# Patient Record
Sex: Male | Born: 1965 | Race: Black or African American | Hispanic: No | Marital: Single | Smoking: Never smoker
Health system: Southern US, Community
[De-identification: ages and names within clinical notes are randomized; demographics above are authoritative.]

## PROBLEM LIST (undated history)

## (undated) DIAGNOSIS — L309 Dermatitis, unspecified: Secondary | ICD-10-CM

## (undated) DIAGNOSIS — F840 Autistic disorder: Secondary | ICD-10-CM

## (undated) DIAGNOSIS — L509 Urticaria, unspecified: Secondary | ICD-10-CM

## (undated) DIAGNOSIS — E119 Type 2 diabetes mellitus without complications: Secondary | ICD-10-CM

## (undated) DIAGNOSIS — F8189 Other developmental disorders of scholastic skills: Secondary | ICD-10-CM

## (undated) HISTORY — DX: Dermatitis, unspecified: L30.9

## (undated) HISTORY — DX: Urticaria, unspecified: L50.9

## (undated) HISTORY — DX: Type 2 diabetes mellitus without complications: E11.9

---

## 2009-05-28 DIAGNOSIS — E669 Obesity, unspecified: Secondary | ICD-10-CM | POA: Insufficient documentation

## 2009-05-28 DIAGNOSIS — I10 Essential (primary) hypertension: Secondary | ICD-10-CM | POA: Insufficient documentation

## 2009-05-28 DIAGNOSIS — E785 Hyperlipidemia, unspecified: Secondary | ICD-10-CM | POA: Insufficient documentation

## 2009-05-28 DIAGNOSIS — F73 Profound intellectual disabilities: Secondary | ICD-10-CM | POA: Insufficient documentation

## 2009-05-28 DIAGNOSIS — F840 Autistic disorder: Secondary | ICD-10-CM | POA: Insufficient documentation

## 2009-05-28 DIAGNOSIS — L309 Dermatitis, unspecified: Secondary | ICD-10-CM | POA: Insufficient documentation

## 2009-06-07 DIAGNOSIS — E1165 Type 2 diabetes mellitus with hyperglycemia: Secondary | ICD-10-CM | POA: Insufficient documentation

## 2009-06-07 DIAGNOSIS — K029 Dental caries, unspecified: Secondary | ICD-10-CM | POA: Insufficient documentation

## 2009-06-18 ENCOUNTER — Ambulatory Visit (HOSPITAL_COMMUNITY): Admission: RE | Admit: 2009-06-18 | Discharge: 2009-06-18 | Payer: Self-pay | Admitting: Dentistry

## 2010-02-25 IMAGING — CR DG CHEST 2V
2 series · 2 of 2 positions shown · non-contrast
Comparison: None

CLINICAL DATA: Preadmission for dental surgery

CHEST - 2 VIEW

[view not recorded (1 of 2)]
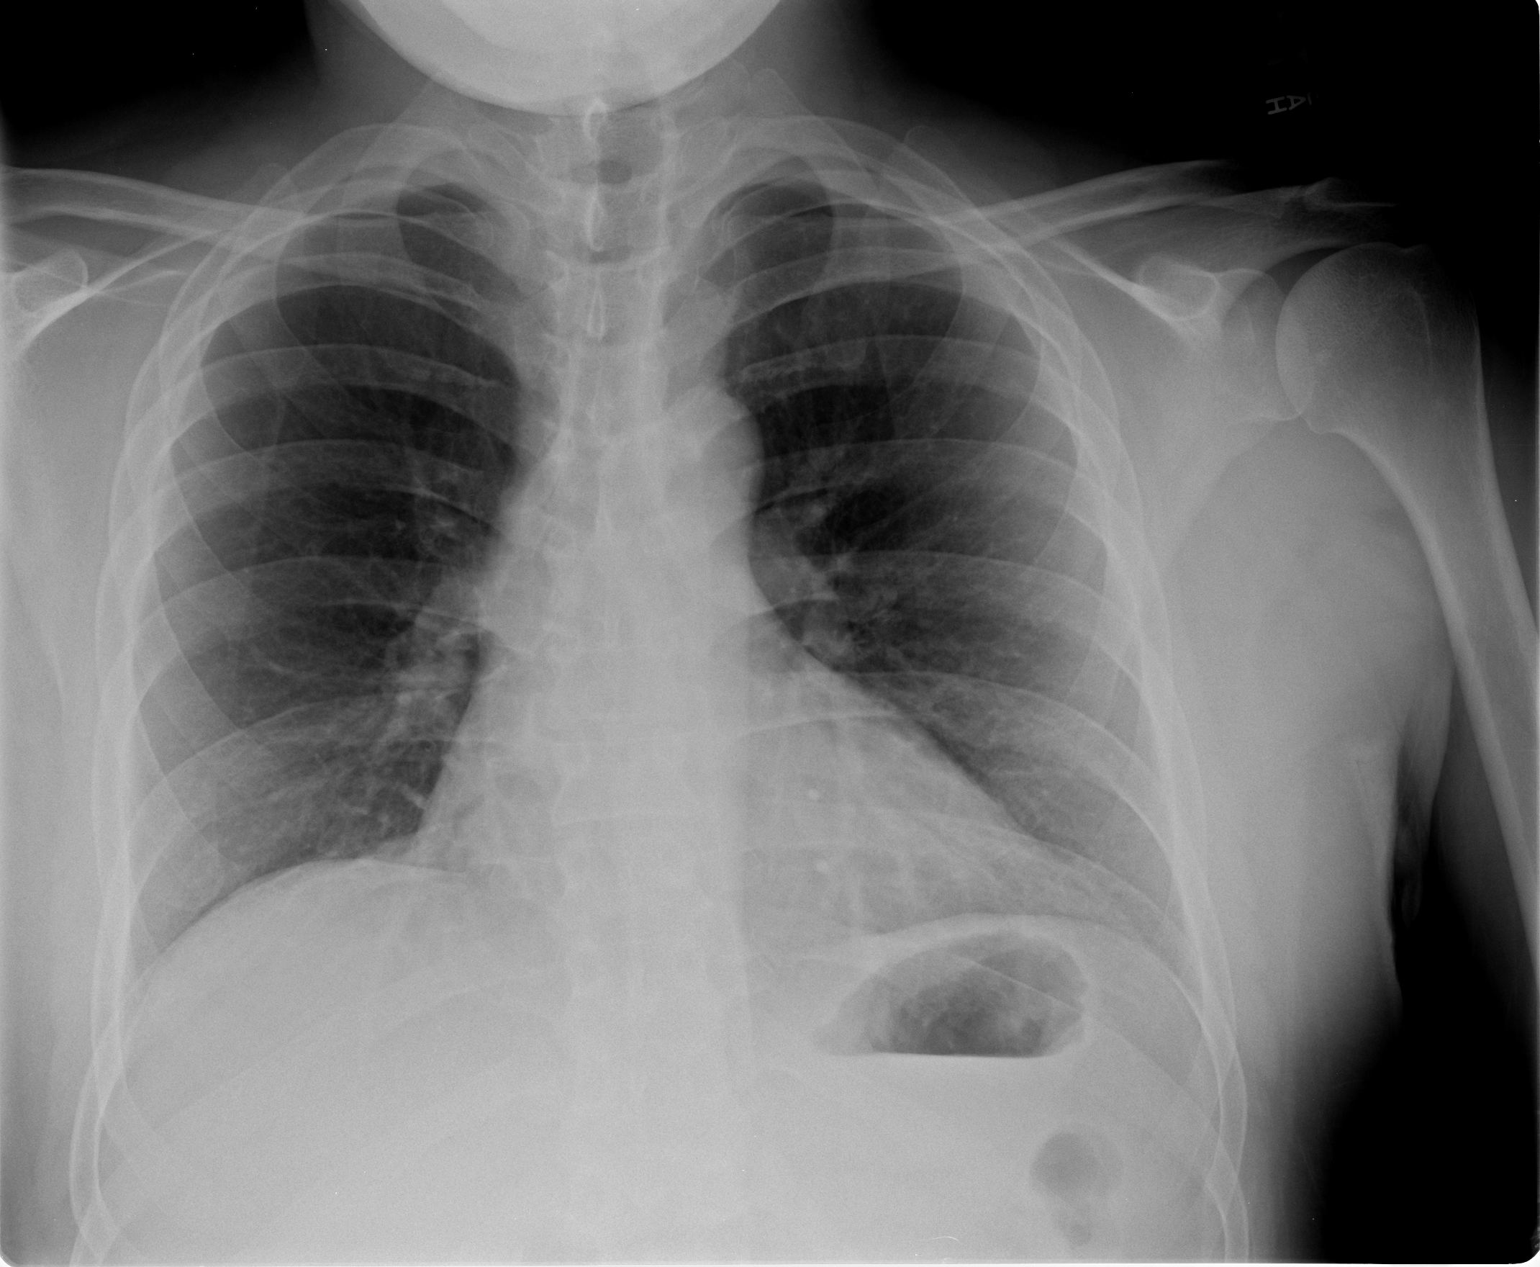

[view not recorded (2 of 2)]
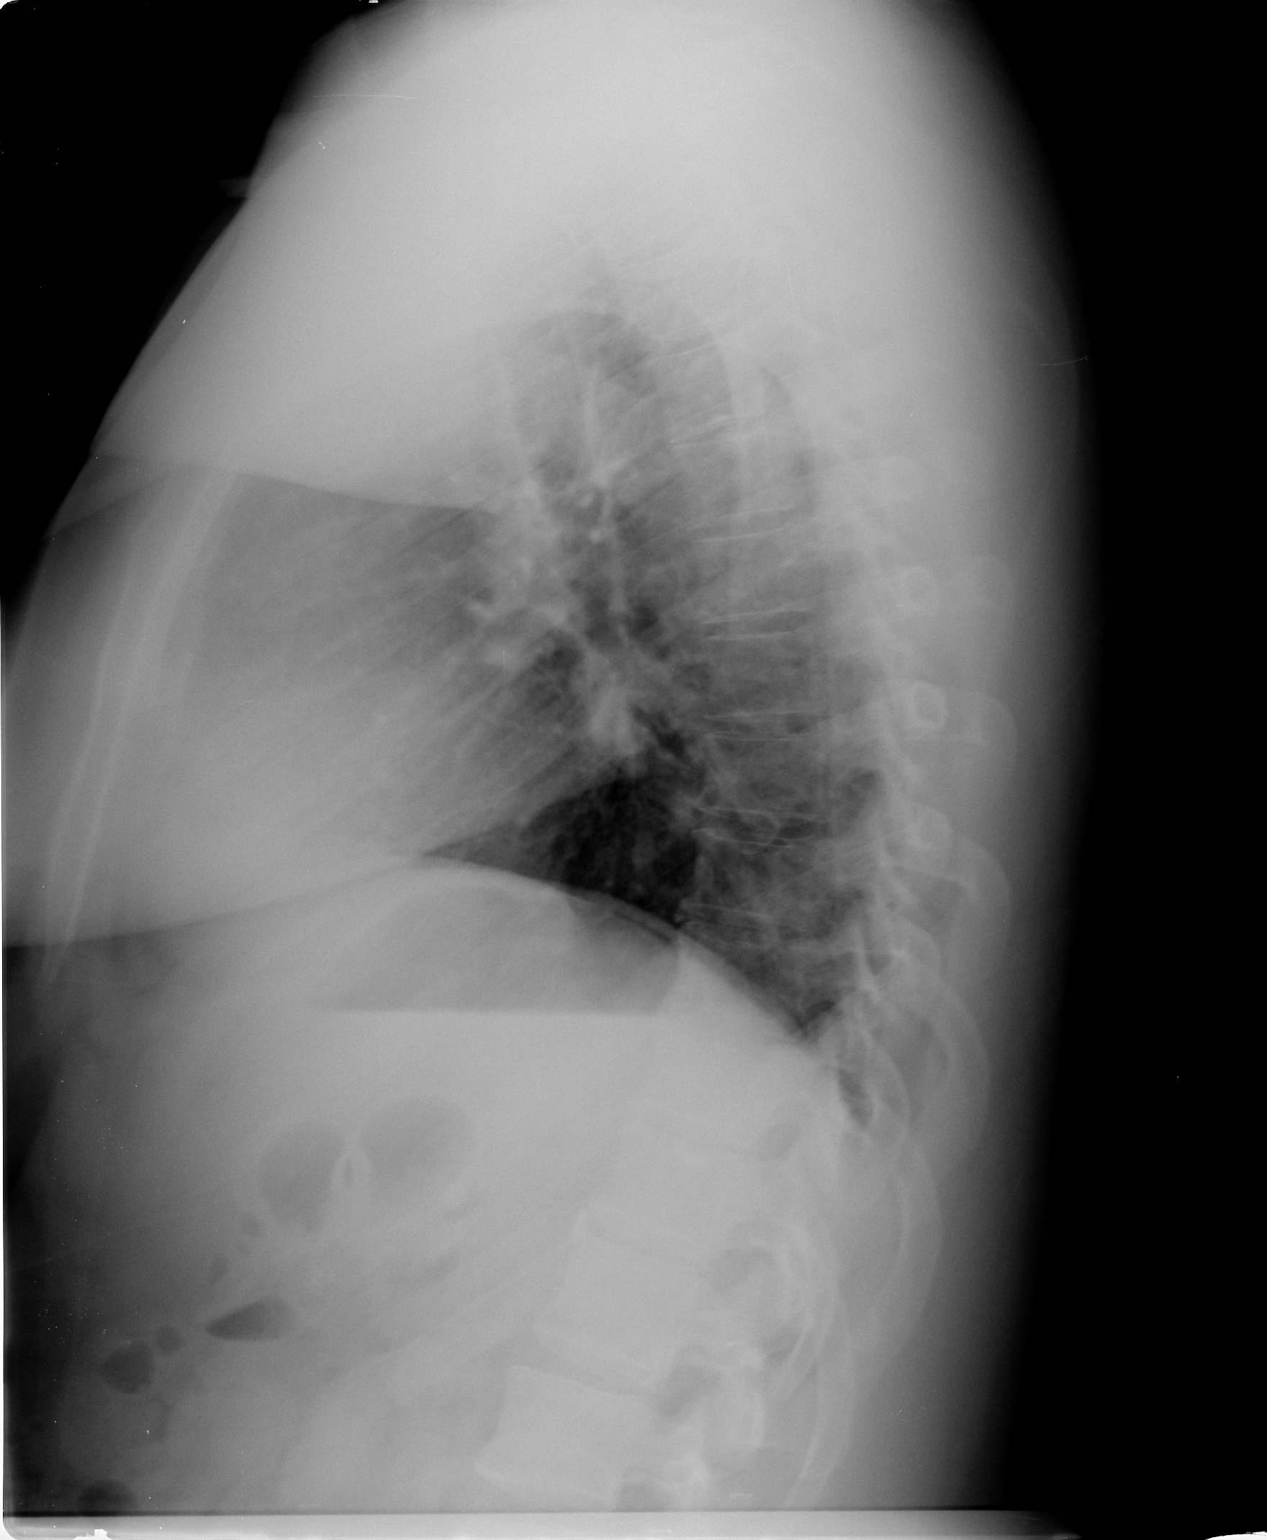

[2 of 2 positions shown; findings below may reference images not displayed]

FINDINGS: Cardiomediastinal silhouette is unremarkable.  No acute
infiltrate or pleural effusion.  No pulmonary edema.  Bony thorax
is unremarkable.
IMPRESSION: No active disease.

## 2010-02-28 DIAGNOSIS — J309 Allergic rhinitis, unspecified: Secondary | ICD-10-CM | POA: Insufficient documentation

## 2010-06-20 DIAGNOSIS — K219 Gastro-esophageal reflux disease without esophagitis: Secondary | ICD-10-CM | POA: Insufficient documentation

## 2010-08-18 LAB — BASIC METABOLIC PANEL
Calcium: 9.6 mg/dL (ref 8.4–10.5)
Creatinine, Ser: 1.03 mg/dL (ref 0.4–1.5)
GFR calc Af Amer: >60 mL/min (ref >60)
GFR calc non Af Amer: >60 mL/min (ref >60)
Sodium: 137 mEq/L (ref 135–145)

## 2010-08-18 LAB — CBC
Platelets: 244 10*3/uL (ref 150–400)
WBC: 5.3 10*3/uL (ref 4.0–10.5)

## 2010-08-18 LAB — GLUCOSE, CAPILLARY

## 2011-09-14 ENCOUNTER — Encounter (INDEPENDENT_AMBULATORY_CARE_PROVIDER_SITE_OTHER): Payer: Medicare Other | Admitting: Ophthalmology

## 2011-09-14 DIAGNOSIS — H43819 Vitreous degeneration, unspecified eye: Secondary | ICD-10-CM

## 2011-09-14 DIAGNOSIS — H2510 Age-related nuclear cataract, unspecified eye: Secondary | ICD-10-CM

## 2011-09-14 DIAGNOSIS — E11319 Type 2 diabetes mellitus with unspecified diabetic retinopathy without macular edema: Secondary | ICD-10-CM

## 2011-09-14 DIAGNOSIS — E1039 Type 1 diabetes mellitus with other diabetic ophthalmic complication: Secondary | ICD-10-CM

## 2011-11-27 DIAGNOSIS — H9209 Otalgia, unspecified ear: Secondary | ICD-10-CM | POA: Insufficient documentation

## 2012-07-29 DIAGNOSIS — H6120 Impacted cerumen, unspecified ear: Secondary | ICD-10-CM | POA: Insufficient documentation

## 2012-09-19 ENCOUNTER — Ambulatory Visit (INDEPENDENT_AMBULATORY_CARE_PROVIDER_SITE_OTHER): Payer: Medicare Other | Admitting: Ophthalmology

## 2012-09-19 DIAGNOSIS — E11319 Type 2 diabetes mellitus with unspecified diabetic retinopathy without macular edema: Secondary | ICD-10-CM

## 2012-09-19 DIAGNOSIS — E1165 Type 2 diabetes mellitus with hyperglycemia: Secondary | ICD-10-CM

## 2012-09-19 DIAGNOSIS — E1139 Type 2 diabetes mellitus with other diabetic ophthalmic complication: Secondary | ICD-10-CM

## 2012-09-19 DIAGNOSIS — H43819 Vitreous degeneration, unspecified eye: Secondary | ICD-10-CM

## 2013-06-06 ENCOUNTER — Ambulatory Visit (INDEPENDENT_AMBULATORY_CARE_PROVIDER_SITE_OTHER): Payer: Medicare Other

## 2013-06-06 VITALS — BP 156/88 | HR 64 | Resp 12

## 2013-06-06 DIAGNOSIS — E1140 Type 2 diabetes mellitus with diabetic neuropathy, unspecified: Secondary | ICD-10-CM

## 2013-06-06 DIAGNOSIS — E1142 Type 2 diabetes mellitus with diabetic polyneuropathy: Secondary | ICD-10-CM

## 2013-06-06 DIAGNOSIS — M79609 Pain in unspecified limb: Secondary | ICD-10-CM

## 2013-06-06 DIAGNOSIS — B351 Tinea unguium: Secondary | ICD-10-CM

## 2013-06-06 DIAGNOSIS — E1149 Type 2 diabetes mellitus with other diabetic neurological complication: Secondary | ICD-10-CM

## 2013-06-06 NOTE — Patient Instructions (Signed)
Diabetes and Foot Care Diabetes may cause you to have problems because of poor blood supply (circulation) to your feet and legs. This may cause the skin on your feet to become thinner, break easier, and heal more slowly. Your skin may become dry, and the skin may peel and crack. You may also have nerve damage in your legs and feet causing decreased feeling in them. You may not notice minor injuries to your feet that could lead to infections or more serious problems. Taking care of your feet is one of the most important things you can do for yourself.  HOME CARE INSTRUCTIONS  Wear shoes at all times, even in the house. Do not go barefoot. Bare feet are easily injured.  Check your feet daily for blisters, cuts, and redness. If you cannot see the bottom of your feet, use a mirror or ask someone for help.  Wash your feet with warm water (do not use hot water) and mild soap. Then pat your feet and the areas between your toes until they are completely dry. Do not soak your feet as this can dry your skin.  Apply a moisturizing lotion or petroleum jelly (that does not contain alcohol and is unscented) to the skin on your feet and to dry, brittle toenails. Do not apply lotion between your toes.  Trim your toenails straight across. Do not dig under them or around the cuticle. File the edges of your nails with an emery board or nail file.  Do not cut corns or calluses or try to remove them with medicine.  Wear clean socks or stockings every day. Make sure they are not too tight. Do not wear knee-high stockings since they may decrease blood flow to your legs.  Wear shoes that fit properly and have enough cushioning. To break in new shoes, wear them for just a few hours a day. This prevents you from injuring your feet. Always look in your shoes before you put them on to be sure there are no objects inside.  Do not cross your legs. This may decrease the blood flow to your feet.  If you find a minor scrape,  cut, or break in the skin on your feet, keep it and the skin around it clean and dry. These areas may be cleansed with mild soap and water. Do not cleanse the area with peroxide, alcohol, or iodine.  When you remove an adhesive bandage, be sure not to damage the skin around it.  If you have a wound, look at it several times a day to make sure it is healing.  Do not use heating pads or hot water bottles. They may burn your skin. If you have lost feeling in your feet or legs, you may not know it is happening until it is too late.  Make sure your health care provider performs a complete foot exam at least annually or more often if you have foot problems. Report any cuts, sores, or bruises to your health care provider immediately. SEEK MEDICAL CARE IF:   You have an injury that is not healing.  You have cuts or breaks in the skin.  You have an ingrown nail.  You notice redness on your legs or feet.  You feel burning or tingling in your legs or feet.  You have pain or cramps in your legs and feet.  Your legs or feet are numb.  Your feet always feel cold. SEEK IMMEDIATE MEDICAL CARE IF:   There is increasing redness,   swelling, or pain in or around a wound.  There is a red line that goes up your leg.  Pus is coming from a wound.  You develop a fever or as directed by your health care provider.  You notice a bad smell coming from an ulcer or wound. Document Released: 05/15/2000 Document Revised: 01/18/2013 Document Reviewed: 10/25/2012 [employer] Patient Information 2014 [employer].  

## 2013-06-06 NOTE — Progress Notes (Signed)
   Subjective:    Patient ID: Eric Cummings, male    DOB: 05/25/1966, 48 y.o.   MRN: [id number]  HPI Comments: '' TOENAILS TRIM.''     Review of Systems  Constitutional: Positive for unexpected weight change.  HENT: Positive for ear pain, sinus pressure and sore throat.   Eyes: Positive for redness, itching and visual disturbance.  Respiratory: Positive for cough.   Gastrointestinal: Positive for constipation.  Genitourinary: Positive for urgency and frequency.  Skin: Positive for rash.  Allergic/Immunologic: Positive for environmental allergies.  Neurological: Positive for dizziness, light-headedness and headaches.  Psychiatric/Behavioral: Positive for behavioral problems and confusion. The patient is nervous/anxious.   All other systems reviewed and are negative.       Objective:   Physical Exam Vascular status as follows intact pedal pulses DP postal for PT plus one over 4 bilateral Refill time 3 seconds all digits neurologically epicritic and proprioceptive sensations grossly diminished on Semmes Weinstein testing to forefoot and plantar arch. Dermatologically skin color pigment normal hair growth absent nails criptotic incurvated friable brittle and discolored consistent with onychomycosis and arthrosis of nails no secondary infection is no ascending cellulitis lymphangitis noted. Should note that patient blood sugars been somewhat elevated over the holiday or changes medication to get them under better control.       Assessment & Plan:  Assessment diabetes with history peripheral neuropathy. Mycotic brittle crumbly discolored ingrowing nails 1 through 5 bilateral debridement presence of diabetes and complications return for future palliative care is needed suggest a 3 month followup  Eric Cummings DPM

## 2013-08-21 DIAGNOSIS — R55 Syncope and collapse: Secondary | ICD-10-CM | POA: Insufficient documentation

## 2013-09-05 ENCOUNTER — Ambulatory Visit (INDEPENDENT_AMBULATORY_CARE_PROVIDER_SITE_OTHER): Payer: Medicare Other

## 2013-09-05 VITALS — BP 155/70 | HR 79 | Resp 16

## 2013-09-05 DIAGNOSIS — E1142 Type 2 diabetes mellitus with diabetic polyneuropathy: Secondary | ICD-10-CM

## 2013-09-05 DIAGNOSIS — B351 Tinea unguium: Secondary | ICD-10-CM

## 2013-09-05 DIAGNOSIS — E1140 Type 2 diabetes mellitus with diabetic neuropathy, unspecified: Secondary | ICD-10-CM

## 2013-09-05 DIAGNOSIS — E1149 Type 2 diabetes mellitus with other diabetic neurological complication: Secondary | ICD-10-CM

## 2013-09-05 DIAGNOSIS — M79609 Pain in unspecified limb: Secondary | ICD-10-CM

## 2013-09-05 NOTE — Patient Instructions (Signed)
Diabetes and Foot Care Diabetes may cause you to have problems because of poor blood supply (circulation) to your feet and legs. This may cause the skin on your feet to become thinner, break easier, and heal more slowly. Your skin may become dry, and the skin may peel and crack. You may also have nerve damage in your legs and feet causing decreased feeling in them. You may not notice minor injuries to your feet that could lead to infections or more serious problems. Taking care of your feet is one of the most important things you can do for yourself.  HOME CARE INSTRUCTIONS  Wear shoes at all times, even in the house. Do not go barefoot. Bare feet are easily injured.  Check your feet daily for blisters, cuts, and redness. If you cannot see the bottom of your feet, use a mirror or ask someone for help.  Wash your feet with warm water (do not use hot water) and mild soap. Then pat your feet and the areas between your toes until they are completely dry. Do not soak your feet as this can dry your skin.  Apply a moisturizing lotion or petroleum jelly (that does not contain alcohol and is unscented) to the skin on your feet and to dry, brittle toenails. Do not apply lotion between your toes.  Trim your toenails straight across. Do not dig under them or around the cuticle. File the edges of your nails with an emery board or nail file.  Do not cut corns or calluses or try to remove them with medicine.  Wear clean socks or stockings every day. Make sure they are not too tight. Do not wear knee-high stockings since they may decrease blood flow to your legs.  Wear shoes that fit properly and have enough cushioning. To break in new shoes, wear them for just a few hours a day. This prevents you from injuring your feet. Always look in your shoes before you put them on to be sure there are no objects inside.  Do not cross your legs. This may decrease the blood flow to your feet.  If you find a minor scrape,  cut, or break in the skin on your feet, keep it and the skin around it clean and dry. These areas may be cleansed with mild soap and water. Do not cleanse the area with peroxide, alcohol, or iodine.  When you remove an adhesive bandage, be sure not to damage the skin around it.  If you have a wound, look at it several times a day to make sure it is healing.  Do not use heating pads or hot water bottles. They may burn your skin. If you have lost feeling in your feet or legs, you may not know it is happening until it is too late.  Make sure your health care provider performs a complete foot exam at least annually or more often if you have foot problems. Report any cuts, sores, or bruises to your health care provider immediately. SEEK MEDICAL CARE IF:   You have an injury that is not healing.  You have cuts or breaks in the skin.  You have an ingrown nail.  You notice redness on your legs or feet.  You feel burning or tingling in your legs or feet.  You have pain or cramps in your legs and feet.  Your legs or feet are numb.  Your feet always feel cold. SEEK IMMEDIATE MEDICAL CARE IF:   There is increasing redness,   swelling, or pain in or around a wound.  There is a red line that goes up your leg.  Pus is coming from a wound.  You develop a fever or as directed by your health care provider.  You notice a bad smell coming from an ulcer or wound. Document Released: 05/15/2000 Document Revised: 01/18/2013 Document Reviewed: 10/25/2012 [employer] Patient Information 2014 [employer].  

## 2013-09-05 NOTE — Progress Notes (Signed)
   Subjective:    Patient ID: Eric Cummings, male    DOB: 06/24/1965, 48 y.o.   MRN: [id number]  HPI Comments: "Trim the toenails"     Review of Systems no new changes or findings at this time     Objective:   Physical Exam 48 year old afterward male persists at this time for diabetic foot and nail care he has well-developed well-nourished although does have some developmental handicap very cooperative although not extremely alert and oriented. Lotion avascular studies reveal PT pulse one over 4 bilateral DP +2/4 bilateral capillary refill time 3 seconds all digits epicritic and proprioceptive sensations intact and symmetric bilateral decreased sensation to the distal toes and plantar arch. Dermatologically skin color pigment normal hair growth absent nails somewhat criptotic brittle friable discolored yellowed consistent with onychomycosis. 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 No secondary infections no open wounds ulcerations no lymphangitis or cellulitis noted. Patient's blood sugars to a stable difficult getting in and to control keep adjusting his knee diabetes medications.       Assessment & Plan:  Assessment this time his diabetes with history peripheral neuropathy thick brittle crumbly mycotic nails friable discolored and brittle one through 5 bilateral debrided this time return for future palliative care Eric Cummings months for an as-needed basis if  [employer] Pacific[employer]ard Stokely Cummings DPM

## 2013-09-20 ENCOUNTER — Ambulatory Visit (INDEPENDENT_AMBULATORY_CARE_PROVIDER_SITE_OTHER): Payer: Medicare Other | Admitting: Ophthalmology

## 2013-09-20 DIAGNOSIS — H43819 Vitreous degeneration, unspecified eye: Secondary | ICD-10-CM

## 2013-09-20 DIAGNOSIS — E1065 Type 1 diabetes mellitus with hyperglycemia: Secondary | ICD-10-CM

## 2013-09-20 DIAGNOSIS — E1039 Type 1 diabetes mellitus with other diabetic ophthalmic complication: Secondary | ICD-10-CM

## 2013-09-20 DIAGNOSIS — I10 Essential (primary) hypertension: Secondary | ICD-10-CM

## 2013-09-20 DIAGNOSIS — H35039 Hypertensive retinopathy, unspecified eye: Secondary | ICD-10-CM

## 2013-09-20 DIAGNOSIS — E11319 Type 2 diabetes mellitus with unspecified diabetic retinopathy without macular edema: Secondary | ICD-10-CM

## 2013-11-03 ENCOUNTER — Ambulatory Visit (INDEPENDENT_AMBULATORY_CARE_PROVIDER_SITE_OTHER): Payer: Medicare Other | Admitting: *Deleted

## 2013-11-03 DIAGNOSIS — E1149 Type 2 diabetes mellitus with other diabetic neurological complication: Secondary | ICD-10-CM

## 2013-11-03 DIAGNOSIS — E1142 Type 2 diabetes mellitus with diabetic polyneuropathy: Secondary | ICD-10-CM

## 2013-11-03 DIAGNOSIS — E1140 Type 2 diabetes mellitus with diabetic neuropathy, unspecified: Secondary | ICD-10-CM

## 2013-11-03 NOTE — Progress Notes (Signed)
   Subjective:    Patient ID: Eric Cummings, male    DOB: 1965-12-15, 48 y.o.   MRN: [id number]  HPI   DIABETIC SHOE MEASUREMENT. Review of Systems     Objective:   Physical Exam        Assessment & Plan:

## 2013-11-09 ENCOUNTER — Telehealth: Payer: Self-pay | Admitting: *Deleted

## 2013-11-09 NOTE — Telephone Encounter (Signed)
Calling regards to measurements for his shoes.  I want to change the color, change to black instead of white.  He'll get more use out of the black shoes.  I called and informed her I got the message and will pass it on to [place].

## 2013-11-30 DIAGNOSIS — R059 Cough, unspecified: Secondary | ICD-10-CM | POA: Insufficient documentation

## 2013-12-05 ENCOUNTER — Ambulatory Visit (INDEPENDENT_AMBULATORY_CARE_PROVIDER_SITE_OTHER): Payer: Medicare Other

## 2013-12-05 VITALS — BP 112/68 | HR 69 | Resp 16

## 2013-12-05 DIAGNOSIS — M79609 Pain in unspecified limb: Secondary | ICD-10-CM

## 2013-12-05 DIAGNOSIS — B351 Tinea unguium: Secondary | ICD-10-CM

## 2013-12-05 DIAGNOSIS — E1140 Type 2 diabetes mellitus with diabetic neuropathy, unspecified: Secondary | ICD-10-CM

## 2013-12-05 DIAGNOSIS — E1149 Type 2 diabetes mellitus with other diabetic neurological complication: Secondary | ICD-10-CM

## 2013-12-05 DIAGNOSIS — E1142 Type 2 diabetes mellitus with diabetic polyneuropathy: Secondary | ICD-10-CM

## 2013-12-05 DIAGNOSIS — M79606 Pain in leg, unspecified: Secondary | ICD-10-CM

## 2013-12-05 NOTE — Progress Notes (Signed)
   Subjective:    Patient ID: Eric Cummings L Lemley, male    DOB: 12/11/1965, 48 y.o.   MRN: [id number]  HPI Comments: his toenails     Review of Systems no new systemic changes or findings noted     Objective:   Physical Exam Neurovascular status is intact pedal pulses are palpable DP +2/4 PT plus one over 4 bilateral Refill time 3 seconds nails thick brittle crumbly friable dystrophic vertigo hallux nails right the most severe. Nails are criptotic incurvated tender with palpation: Shoe wear. Patient from 3 changes of both feet left more so than right with Charcot-type changes and abduction of the forefoot still waiting on approval for diabetic shoes and custom insoles. Does have diffuse keratoses no open wounds ulcerations patient's caregiver indicates or sucking hard time stabilizing his diabetes he keeps fluctuating.       Assessment & Plan:  Assessment diabetes with history peripheral neuropathy brittle crumbly friable mycotic nails 1 through 5 bilateral debridement at this time return for future palliative care is needed suggest 3 month followup we'll contact patient as soon as approval for diabetic shoes has been obtained from primary physician. Maintain future palliative care is needed next  Alvan Dameichard Dane Kopke DPM

## 2013-12-05 NOTE — Patient Instructions (Signed)
Diabetes and Foot Care Diabetes may cause you to have problems because of poor blood supply (circulation) to your feet and legs. This may cause the skin on your feet to become thinner, break easier, and heal more slowly. Your skin may become dry, and the skin may peel and crack. You may also have nerve damage in your legs and feet causing decreased feeling in them. You may not notice minor injuries to your feet that could lead to infections or more serious problems. Taking care of your feet is one of the most important things you can do for yourself.  HOME CARE INSTRUCTIONS  Wear shoes at all times, even in the house. Do not go barefoot. Bare feet are easily injured.  Check your feet daily for blisters, cuts, and redness. If you cannot see the bottom of your feet, use a mirror or ask someone for help.  Wash your feet with warm water (do not use hot water) and mild soap. Then pat your feet and the areas between your toes until they are completely dry. Do not soak your feet as this can dry your skin.  Apply a moisturizing lotion or petroleum jelly (that does not contain alcohol and is unscented) to the skin on your feet and to dry, brittle toenails. Do not apply lotion between your toes.  Trim your toenails straight across. Do not dig under them or around the cuticle. File the edges of your nails with an emery board or nail file.  Do not cut corns or calluses or try to remove them with medicine.  Wear clean socks or stockings every day. Make sure they are not too tight. Do not wear knee-high stockings since they may decrease blood flow to your legs.  Wear shoes that fit properly and have enough cushioning. To break in new shoes, wear them for just a few hours a day. This prevents you from injuring your feet. Always look in your shoes before you put them on to be sure there are no objects inside.  Do not cross your legs. This may decrease the blood flow to your feet.  If you find a minor scrape,  cut, or break in the skin on your feet, keep it and the skin around it clean and dry. These areas may be cleansed with mild soap and water. Do not cleanse the area with peroxide, alcohol, or iodine.  When you remove an adhesive bandage, be sure not to damage the skin around it.  If you have a wound, look at it several times a day to make sure it is healing.  Do not use heating pads or hot water bottles. They may burn your skin. If you have lost feeling in your feet or legs, you may not know it is happening until it is too late.  Make sure your health care provider performs a complete foot exam at least annually or more often if you have foot problems. Report any cuts, sores, or bruises to your health care provider immediately. SEEK MEDICAL CARE IF:   You have an injury that is not healing.  You have cuts or breaks in the skin.  You have an ingrown nail.  You notice redness on your legs or feet.  You feel burning or tingling in your legs or feet.  You have pain or cramps in your legs and feet.  Your legs or feet are numb.  Your feet always feel cold. SEEK IMMEDIATE MEDICAL CARE IF:   There is increasing redness,   swelling, or pain in or around a wound.  There is a red line that goes up your leg.  Pus is coming from a wound.  You develop a fever or as directed by your health care provider.  You notice a bad smell coming from an ulcer or wound. Document Released: 05/15/2000 Document Revised: 01/18/2013 Document Reviewed: 10/25/2012 [employer] Patient Information 2015 [employer]. This information is not intended to replace advice given to you by your health care provider. Make sure you discuss any questions you have with your health care provider.  

## 2013-12-27 ENCOUNTER — Ambulatory Visit (INDEPENDENT_AMBULATORY_CARE_PROVIDER_SITE_OTHER): Payer: Medicare Other

## 2013-12-27 DIAGNOSIS — M79609 Pain in unspecified limb: Secondary | ICD-10-CM

## 2013-12-27 DIAGNOSIS — B351 Tinea unguium: Secondary | ICD-10-CM

## 2013-12-27 DIAGNOSIS — E1142 Type 2 diabetes mellitus with diabetic polyneuropathy: Secondary | ICD-10-CM

## 2013-12-27 DIAGNOSIS — E1149 Type 2 diabetes mellitus with other diabetic neurological complication: Secondary | ICD-10-CM

## 2013-12-27 DIAGNOSIS — M79606 Pain in leg, unspecified: Secondary | ICD-10-CM

## 2013-12-27 DIAGNOSIS — E1140 Type 2 diabetes mellitus with diabetic neuropathy, unspecified: Secondary | ICD-10-CM

## 2013-12-27 NOTE — Progress Notes (Signed)
   Subjective:    Patient ID: Eric Cummings, male    DOB: 08/23/1965, 48 y.o.   MRN: [id number]  HPI patient presents for pickup and fitting of diabetic shoe    Review of Systems No new changes or findings noted    Objective:   Physical Exam  Diabetic shoes fit and contour well the insoles also think contour well however they wanted a black shoe in the white she was delivered will have to reorder the shoe and correct color otherwise the fit and shaped were excellent.      Assessment & Plan:  Assessment diabetes with peripheral neuropathy need for diabetic shoe replacement at this time we'll reorder the shoes and correct color although the correct size and fit was achieved we'll reorder and constipation with the next week or 2 for fitting and dispensed   Alvan Dameichard Pricsilla Lindvall DPM

## 2014-01-26 ENCOUNTER — Ambulatory Visit (INDEPENDENT_AMBULATORY_CARE_PROVIDER_SITE_OTHER): Payer: Medicare Other

## 2014-01-26 VITALS — BP 122/78 | HR 74 | Resp 17

## 2014-01-26 DIAGNOSIS — E1140 Type 2 diabetes mellitus with diabetic neuropathy, unspecified: Secondary | ICD-10-CM

## 2014-01-26 DIAGNOSIS — M2040 Other hammer toe(s) (acquired), unspecified foot: Secondary | ICD-10-CM

## 2014-01-26 DIAGNOSIS — M79606 Pain in leg, unspecified: Secondary | ICD-10-CM

## 2014-01-26 DIAGNOSIS — M79609 Pain in unspecified limb: Secondary | ICD-10-CM

## 2014-01-26 DIAGNOSIS — E1149 Type 2 diabetes mellitus with other diabetic neurological complication: Secondary | ICD-10-CM

## 2014-01-26 DIAGNOSIS — E1142 Type 2 diabetes mellitus with diabetic polyneuropathy: Secondary | ICD-10-CM

## 2014-01-26 DIAGNOSIS — B351 Tinea unguium: Secondary | ICD-10-CM

## 2014-01-26 DIAGNOSIS — Q828 Other specified congenital malformations of skin: Secondary | ICD-10-CM

## 2014-01-26 NOTE — Progress Notes (Signed)
   Subjective:    Patient ID: Eric Cummings, male    DOB: 05-09-1966, 48 y.o.   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 The shoes are measured and fitted well written instructions for use in break in are dispensed  Alvan Dame DPM

## 2014-01-26 NOTE — Patient Instructions (Signed)

## 2014-03-06 ENCOUNTER — Ambulatory Visit: Payer: Medicare Other

## 2014-03-08 ENCOUNTER — Encounter: Payer: Self-pay | Admitting: Podiatrist

## 2014-03-08 ENCOUNTER — Ambulatory Visit (INDEPENDENT_AMBULATORY_CARE_PROVIDER_SITE_OTHER): Payer: Medicare Other | Admitting: Podiatrist

## 2014-03-08 DIAGNOSIS — B351 Tinea unguium: Secondary | ICD-10-CM

## 2014-03-08 DIAGNOSIS — M79676 Pain in unspecified toe(s): Secondary | ICD-10-CM

## 2014-03-08 DIAGNOSIS — E1140 Type 2 diabetes mellitus with diabetic neuropathy, unspecified: Secondary | ICD-10-CM

## 2014-03-08 NOTE — Progress Notes (Signed)
HPI: Patient presents today for follow up of diabetic foot and nail care. Past medical history, meds, and allergies reviewed. Patient states blood sugar is under fair  control with fluctuating blood sugars.  Relates he has his new diabetic shoes and he is happy with the shoes.   Objective:  Physical Exam  Neurovascular status is intact pedal pulses are palpable DP +2/4 PT plus one over 4 bilateral Refill time 3 seconds Charcot-type changes and abduction of the forefoot noted.  no open wounds present.  Neurological sensation is Decreased to Semmes Weinstein monofilament bilateral at 2/5 sites bilateral.  Dermatological exam reveals  absence of pre ulcerative/ hyperkeratotic lesions.   Toenails are elongated, incurvated, discolored, dystrophic with ingrown deformity present. hallux nails right the most severe. Nails are criptotic incurvated tender with palpation:    Assessment: Diabetes with Neuropathy , Ingrown nail deformity, pes planus deformity  Plan: Discussed treatment options and alternatives. Debrided nails without complication.  Return appointment recommended at routine intervals of 3 months.   Marlowe AschoffKathryn Kamarian Sahakian, DPM

## 2014-06-07 ENCOUNTER — Ambulatory Visit: Payer: Medicare Other | Admitting: Podiatrist

## 2014-06-22 ENCOUNTER — Ambulatory Visit: Payer: Self-pay | Admitting: Podiatrist

## 2014-07-20 ENCOUNTER — Ambulatory Visit (INDEPENDENT_AMBULATORY_CARE_PROVIDER_SITE_OTHER): Payer: Medicare Other | Admitting: Podiatrist

## 2014-07-20 DIAGNOSIS — M79676 Pain in unspecified toe(s): Secondary | ICD-10-CM | POA: Diagnosis not present

## 2014-07-20 DIAGNOSIS — B351 Tinea unguium: Secondary | ICD-10-CM

## 2014-07-20 NOTE — Progress Notes (Signed)
HPI: Patient presents today for follow up of diabetic foot and nail care. Past medical history, meds, and allergies reviewed. Patient states blood sugar is under fair  control    Objective:  Physical Exam  Neurovascular status is intact pedal pulses are palpable DP +2/4 PT plus one over 4 bilateral Refill time 3 seconds Charcot-type changes and abduction of the forefoot noted.  no open wounds present.  Neurological sensation is Decreased to Semmes Weinstein monofilament bilateral at 2/5 sites bilateral.  Dermatological exam reveals  absence of pre ulcerative/ hyperkeratotic lesions.   Toenails are elongated, incurvated, discolored, dystrophic with ingrown deformity present. hallux nails right the most severe. Nails are criptotic incurvated tender with palpation:    Assessment: Diabetes with Neuropathy , Ingrown nail deformity, pes planus deformity  Plan: Discussed treatment options and alternatives. Debrided nails without complication.  Return appointment recommended at routine intervals of 3 months.   Marlowe AschoffKathryn Andrika Peraza, DPM

## 2014-07-20 NOTE — Patient Instructions (Signed)
Diabetes and Foot Care Diabetes may cause you to have problems because of poor blood supply (circulation) to your feet and legs. This may cause the skin on your feet to become thinner, break easier, and heal more slowly. Your skin may become dry, and the skin may peel and crack. You may also have nerve damage in your legs and feet causing decreased feeling in them. You may not notice minor injuries to your feet that could lead to infections or more serious problems. Taking care of your feet is one of the most important things you can do for yourself.  HOME CARE INSTRUCTIONS  Wear shoes at all times, even in the house. Do not go barefoot. Bare feet are easily injured.  Check your feet daily for blisters, cuts, and redness. If you cannot see the bottom of your feet, use a mirror or ask someone for help.  Wash your feet with warm water (do not use hot water) and mild soap. Then pat your feet and the areas between your toes until they are completely dry. Do not soak your feet as this can dry your skin.  Apply a moisturizing lotion or petroleum jelly (that does not contain alcohol and is unscented) to the skin on your feet and to dry, brittle toenails. Do not apply lotion between your toes.  Trim your toenails straight across. Do not dig under them or around the cuticle. File the edges of your nails with an emery board or nail file.  Do not cut corns or calluses or try to remove them with medicine.  Wear clean socks or stockings every day. Make sure they are not too tight. Do not wear knee-high stockings since they may decrease blood flow to your legs.  Wear shoes that fit properly and have enough cushioning. To break in new shoes, wear them for just a few hours a day. This prevents you from injuring your feet. Always look in your shoes before you put them on to be sure there are no objects inside.  Do not cross your legs. This may decrease the blood flow to your feet.  If you find a minor scrape,  cut, or break in the skin on your feet, keep it and the skin around it clean and dry. These areas may be cleansed with mild soap and water. Do not cleanse the area with peroxide, alcohol, or iodine.  When you remove an adhesive bandage, be sure not to damage the skin around it.  If you have a wound, look at it several times a day to make sure it is healing.  Do not use heating pads or hot water bottles. They may burn your skin. If you have lost feeling in your feet or legs, you may not know it is happening until it is too late.  Make sure your health care provider performs a complete foot exam at least annually or more often if you have foot problems. Report any cuts, sores, or bruises to your health care provider immediately. SEEK MEDICAL CARE IF:   You have an injury that is not healing.  You have cuts or breaks in the skin.  You have an ingrown nail.  You notice redness on your legs or feet.  You feel burning or tingling in your legs or feet.  You have pain or cramps in your legs and feet.  Your legs or feet are numb.  Your feet always feel cold. SEEK IMMEDIATE MEDICAL CARE IF:   There is increasing redness,   swelling, or pain in or around a wound.  There is a red line that goes up your leg.  Pus is coming from a wound.  You develop a fever or as directed by your health care provider.  You notice a bad smell coming from an ulcer or wound. Document Released: 05/15/2000 Document Revised: 01/18/2013 Document Reviewed: 10/25/2012 [employer] Patient Information 2015 [employer]. This information is not intended to replace advice given to you by your health care provider. Make sure you discuss any questions you have with your health care provider.  

## 2014-09-14 DIAGNOSIS — E109 Type 1 diabetes mellitus without complications: Secondary | ICD-10-CM | POA: Insufficient documentation

## 2014-09-14 DIAGNOSIS — R269 Unspecified abnormalities of gait and mobility: Secondary | ICD-10-CM | POA: Insufficient documentation

## 2014-10-10 ENCOUNTER — Ambulatory Visit (INDEPENDENT_AMBULATORY_CARE_PROVIDER_SITE_OTHER): Payer: Medicare Other | Admitting: Ophthalmology

## 2014-10-19 ENCOUNTER — Ambulatory Visit: Payer: Medicare Other

## 2014-11-02 ENCOUNTER — Ambulatory Visit (INDEPENDENT_AMBULATORY_CARE_PROVIDER_SITE_OTHER): Payer: Medicare Other | Admitting: Ophthalmology

## 2014-11-02 DIAGNOSIS — E10319 Type 1 diabetes mellitus with unspecified diabetic retinopathy without macular edema: Secondary | ICD-10-CM

## 2014-11-02 DIAGNOSIS — H35033 Hypertensive retinopathy, bilateral: Secondary | ICD-10-CM | POA: Diagnosis not present

## 2014-11-02 DIAGNOSIS — I10 Essential (primary) hypertension: Secondary | ICD-10-CM

## 2014-11-02 DIAGNOSIS — E10329 Type 1 diabetes mellitus with mild nonproliferative diabetic retinopathy without macular edema: Secondary | ICD-10-CM

## 2014-11-02 DIAGNOSIS — H43813 Vitreous degeneration, bilateral: Secondary | ICD-10-CM

## 2014-11-07 ENCOUNTER — Ambulatory Visit (INDEPENDENT_AMBULATORY_CARE_PROVIDER_SITE_OTHER): Payer: Medicare Other | Admitting: Podiatry

## 2014-11-07 DIAGNOSIS — M79676 Pain in unspecified toe(s): Secondary | ICD-10-CM | POA: Diagnosis not present

## 2014-11-07 DIAGNOSIS — B351 Tinea unguium: Secondary | ICD-10-CM

## 2014-11-07 DIAGNOSIS — E1140 Type 2 diabetes mellitus with diabetic neuropathy, unspecified: Secondary | ICD-10-CM

## 2014-11-07 NOTE — Progress Notes (Signed)
Patient ID: Eric Cummings, male   DOB: 08/24/1965, 49 y.o.   MRN: [id number]  Subjective: 49 y.o.-year-old male returns the office today for painful, elongated, thickened toenails which he is unable to trim himself. Denies any acute changes since last appointment and no new complaints today. Denies any systemic complaints such as fevers, chills, nausea, vomiting.   Objective: Awake, alert, NAD DP/PT pulses palpable, CRT less than 3 seconds Protective sensation decreased with Simms Weinstein monofilament Nails hypertrophic, dystrophic, elongated, brittle, discolored 10. There is incurvation of the nails. There is tenderness overlying the nails 1-5 bilaterally. There is no surrounding erythema or drainage along the nail sites. No open lesions or pre-ulcerative lesions are identified. No other areas of tenderness bilateral lower extremities. No overlying edema, erythema, increased warmth. No pain with calf compression, swelling, warmth, erythema.  Assessment: Patient presents with symptomatic onychomycosis  Plan: -Treatment options including alternatives, risks, complications were discussed -Nails sharply debrided 10 without complication/bleeding. -Discussed daily foot inspection. If there are any changes, to call the office immediately.  -Follow-up in 3 months or sooner if any problems are to arise. In the meantime, encouraged to call the office with any questions, concerns, changes symptoms.

## 2014-11-07 NOTE — Patient Instructions (Signed)
Over the counter treatment for athletes foot is lotramin cream.

## 2015-01-25 DIAGNOSIS — Z794 Long term (current) use of insulin: Secondary | ICD-10-CM | POA: Insufficient documentation

## 2015-01-25 DIAGNOSIS — I499 Cardiac arrhythmia, unspecified: Secondary | ICD-10-CM | POA: Insufficient documentation

## 2015-02-15 ENCOUNTER — Ambulatory Visit: Payer: Medicare Other | Admitting: Podiatry

## 2015-02-18 ENCOUNTER — Ambulatory Visit (INDEPENDENT_AMBULATORY_CARE_PROVIDER_SITE_OTHER): Payer: Medicare Other | Admitting: Podiatry

## 2015-02-18 ENCOUNTER — Encounter: Payer: Self-pay | Admitting: Podiatry

## 2015-02-18 DIAGNOSIS — M79676 Pain in unspecified toe(s): Secondary | ICD-10-CM

## 2015-02-18 DIAGNOSIS — B351 Tinea unguium: Secondary | ICD-10-CM

## 2015-02-18 DIAGNOSIS — E1140 Type 2 diabetes mellitus with diabetic neuropathy, unspecified: Secondary | ICD-10-CM | POA: Diagnosis not present

## 2015-02-18 NOTE — Progress Notes (Signed)
Patient ID: KEASTON PILE, male   DOB: 04-03-1966, 49 y.o.   MRN: [id number]  Subjective: 49 y.o.-year-old male returns the office today for painful, elongated, thickened toenails which he is unable to trim himself. He presents today with a caregiver. She states that at night he also redness feet he can surround which causes holes in his socks. She is concerned that his his neuropathy. The patient is unable to verbalize and therefore difficult to understand why he is doing this. Denies any acute changes since last appointment and no new complaints today. Denies any systemic complaints such as fevers, chills, nausea, vomiting.   Objective: Awake, NAD; nonverbal and presents with caregiver  DP/PT pulses palpable, CRT less than 3 seconds Difficult to assess protective sensation with Dorann Ou monofilament and vibratory sensation. Achilles reflexes decreased. Nails hypertrophic, dystrophic, elongated, brittle, discolored 10. There is incurvation of the nails. There is tenderness overlying the nails 1-5 bilaterally. There is no surrounding erythema or drainage along the nail sites. No open lesions or pre-ulcerative lesions are identified. No other areas of tenderness bilateral lower extremities. No overlying edema, erythema, increased warmth. No pain with calf compression, swelling, warmth, erythema.  Assessment: Patient presents with symptomatic onychomycosis  Plan: -Treatment options including alternatives, risks, complications were discussed -Nails sharply debrided 10 without complication/bleeding. -He could be rubbing his feet on the ground due to neuropathy and him trying to feel his feet. Given his other medical conditions and medications would recommend weaning off on medication for that opinion. However encourage him to discuss this with his primary care physician. He has been apparently using an anti-fungal to her feet. He could be rubbing his feet due to itch from fungus. Continue  anti-fungal cream.  -Discussed daily foot inspection. If there are any changes, to call the office immediately.  -Follow-up in 3 months or sooner if any problems are to arise. In the meantime, encouraged to call the office with any questions, concerns, changes symptoms.  Ovid Curd, DPM

## 2015-05-17 DIAGNOSIS — K5900 Constipation, unspecified: Secondary | ICD-10-CM | POA: Insufficient documentation

## 2015-05-17 DIAGNOSIS — G629 Polyneuropathy, unspecified: Secondary | ICD-10-CM | POA: Insufficient documentation

## 2015-05-17 DIAGNOSIS — B351 Tinea unguium: Secondary | ICD-10-CM | POA: Insufficient documentation

## 2015-05-20 ENCOUNTER — Ambulatory Visit (INDEPENDENT_AMBULATORY_CARE_PROVIDER_SITE_OTHER): Payer: Medicare Other | Admitting: Podiatry

## 2015-05-20 DIAGNOSIS — E1140 Type 2 diabetes mellitus with diabetic neuropathy, unspecified: Secondary | ICD-10-CM

## 2015-05-20 DIAGNOSIS — B351 Tinea unguium: Secondary | ICD-10-CM

## 2015-05-20 DIAGNOSIS — M79676 Pain in unspecified toe(s): Secondary | ICD-10-CM | POA: Diagnosis not present

## 2015-05-20 NOTE — Progress Notes (Signed)
Patient ID: Eric Cummings, male   DOB: 11/01/1965, 49 y.o.   MRN: [id number]  Subjective: 49 y.o.-year-old male returns the office today for painful, elongated, thickened toenails which he is unable to trim himself. He presents today with a caregiver. He did start gabapentin last Friday. Denies any acute changes since last appointment and no new complaints today. Denies any systemic complaints such as fevers, chills, nausea, vomiting.   Objective: Awake, NAD; nonverbal and presents with caregiver  DP/PT pulses palpable, CRT less than 3 seconds Difficult to assess protective sensation with Dorann OuSimms Weinstein monofilament and vibratory sensation. Achilles reflexes decreased. Nails hypertrophic, dystrophic, elongated, brittle, discolored 10. There is incurvation of the nails. There is tenderness overlying the nails 1-5 bilaterally. There is no surrounding erythema or drainage along the nail sites. No open lesions or pre-ulcerative lesions are identified. No other areas of tenderness bilateral lower extremities. No overlying edema, erythema, increased warmth. No pain with calf compression, swelling, warmth, erythema.  Assessment: Patient presents with symptomatic onychomycosis  Plan: -Treatment options including alternatives, risks, complications were discussed -Nails sharply debrided 10 without complication/bleeding. -Continue gabapentin  -Discussed daily foot inspection. If there are any changes, to call the office immediately.  -Follow-up in 3 months or sooner if any problems are to arise. In the meantime, encouraged to call the office with any questions, concerns, changes symptoms.  Ovid CurdMatthew Wagoner, DPM

## 2015-08-16 DIAGNOSIS — R627 Adult failure to thrive: Secondary | ICD-10-CM | POA: Insufficient documentation

## 2015-08-19 ENCOUNTER — Ambulatory Visit: Payer: Medicare Other | Admitting: Podiatry

## 2015-09-06 ENCOUNTER — Ambulatory Visit (INDEPENDENT_AMBULATORY_CARE_PROVIDER_SITE_OTHER): Payer: Medicare Other | Admitting: Podiatry

## 2015-09-06 ENCOUNTER — Encounter: Payer: Self-pay | Admitting: Podiatry

## 2015-09-06 ENCOUNTER — Telehealth: Payer: Self-pay | Admitting: *Deleted

## 2015-09-06 DIAGNOSIS — B351 Tinea unguium: Secondary | ICD-10-CM

## 2015-09-06 DIAGNOSIS — M79676 Pain in unspecified toe(s): Secondary | ICD-10-CM

## 2015-09-06 DIAGNOSIS — M2140 Flat foot [pes planus] (acquired), unspecified foot: Secondary | ICD-10-CM

## 2015-09-06 DIAGNOSIS — M2040 Other hammer toe(s) (acquired), unspecified foot: Secondary | ICD-10-CM

## 2015-09-06 DIAGNOSIS — E1140 Type 2 diabetes mellitus with diabetic neuropathy, unspecified: Secondary | ICD-10-CM | POA: Diagnosis not present

## 2015-09-06 MED ORDER — NONFORMULARY OR COMPOUNDED ITEM: Status: DC

## 2015-09-10 NOTE — Progress Notes (Signed)
Patient ID: Eric Cummings, male   DOB: 12/12/1965, 50 y.o.   MRN: [id number]  Subjective: 50 y.o.-year-old male returns the office today for painful, elongated, thickened toenails which he is unable to trim himself. He presents today with his sister who is his caregiver.Denies any acute changes since last appointment and no new complaints today. Denies any systemic complaints such as fevers, chills, nausea, vomiting.   Objective: Awake, NAD; nonverbal and presents with caregiver  DP/PT pulses palpable, CRT less than 3 seconds Difficult to assess protective sensation with Dorann OuSimms Weinstein monofilament and vibratory sensation. Achilles reflexes decreased. Nails hypertrophic, dystrophic, elongated, brittle, discolored 10. There is incurvation of the nails. There is tenderness overlying the nails 1-5 bilaterally. There is no surrounding erythema or drainage along the nail sites. No open lesions or pre-ulcerative lesions are identified. No other areas of tenderness bilateral lower extremities. No overlying edema, erythema, increased warmth. Hammertoe deformity present as well as flatfoot. No pain with calf compression, swelling, warmth, erythema.  Assessment: Patient presents with symptomatic onychomycosis  Plan: -Treatment options including alternatives, risks, complications were discussed -Nails sharply debrided 10 without complication/bleeding. -The patient's sister states that he takes his feet rubs is for a lot at night. This may be due to neuropathy but difficult to evaluate. We'll also start a compound cream which is ordered today for neuropathy. -Given his comorbidities as well as diabetes with neuropathy and deformity I recommend diabetic shoes. Paperwork was completed today for precertification of diabetic shoes. -Discussed daily foot inspection. If there are any changes, to call the office immediately.  -Follow-up in 3 months or sooner if any problems are to arise. In the meantime,  encouraged to call the office with any questions, concerns, changes symptoms.  Eric Cummings, DPM

## 2015-09-17 NOTE — Telephone Encounter (Signed)
Entered in error

## 2015-12-13 ENCOUNTER — Encounter: Payer: Self-pay | Admitting: Podiatry

## 2015-12-13 ENCOUNTER — Ambulatory Visit (INDEPENDENT_AMBULATORY_CARE_PROVIDER_SITE_OTHER): Payer: Medicare Other | Admitting: Podiatry

## 2015-12-13 VITALS — BP 134/75 | HR 82 | Resp 12

## 2015-12-13 DIAGNOSIS — B351 Tinea unguium: Secondary | ICD-10-CM | POA: Diagnosis not present

## 2015-12-13 DIAGNOSIS — M79676 Pain in unspecified toe(s): Secondary | ICD-10-CM

## 2015-12-13 DIAGNOSIS — E1140 Type 2 diabetes mellitus with diabetic neuropathy, unspecified: Secondary | ICD-10-CM

## 2015-12-13 NOTE — Progress Notes (Signed)
Patient ID: Eric Cummings, male   DOB: 10/13/1965, 50 y.o.   MRN: [id number] Patient presents to be scanned and measured for diabetic shoes and inserts.

## 2015-12-22 NOTE — Progress Notes (Signed)
Patient ID: Eric Cummings, male   DOB: 05/27/1966, 50 y.o.   MRN: [id number]  Subjective: 50 y.o.-year-old male returns the office today for painful, elongated, thickened toenails which he is unable to trim himself. He presents today with his sister who is his caregiver.Denies any acute changes since last appointment and no new complaints today. Also presents today for diabetic shoe measurement. Denies any systemic complaints such as fevers, chills, nausea, vomiting.   Objective: Awake, NAD; nonverbal and presents with caregiver  DP/PT pulses palpable, CRT less than 3 seconds Nails hypertrophic, dystrophic, elongated, brittle, discolored 10. There is incurvation of the nails. There is tenderness overlying the nails 1-5 bilaterally. There is no surrounding erythema or drainage along the nail sites. No open lesions or pre-ulcerative lesions are identified. No other areas of tenderness bilateral lower extremities. No overlying edema, erythema, increased warmth. Hammertoe deformity present as well as flatfoot. No pain with calf compression, swelling, warmth, erythema.  Assessment: Patient presents with symptomatic onychomycosis  Plan: -Treatment options including alternatives, risks, complications were discussed -Nails sharply debrided 10 without complication/bleeding. -Measured today for diabetic shoes. -Discussed daily foot inspection. If there are any changes, to call the office immediately.  -Follow-up in 3 months or sooner if any problems are to arise. In the meantime, encouraged to call the office with any questions, concerns, changes symptoms.  Ovid Curd, DPM

## 2016-01-08 DIAGNOSIS — E1142 Type 2 diabetes mellitus with diabetic polyneuropathy: Secondary | ICD-10-CM | POA: Insufficient documentation

## 2016-01-08 DIAGNOSIS — E1121 Type 2 diabetes mellitus with diabetic nephropathy: Secondary | ICD-10-CM | POA: Insufficient documentation

## 2016-01-10 ENCOUNTER — Ambulatory Visit: Payer: Medicare Other

## 2016-01-15 ENCOUNTER — Ambulatory Visit (INDEPENDENT_AMBULATORY_CARE_PROVIDER_SITE_OTHER): Payer: Medicare Other | Admitting: Podiatry

## 2016-01-15 DIAGNOSIS — M2040 Other hammer toe(s) (acquired), unspecified foot: Secondary | ICD-10-CM | POA: Diagnosis not present

## 2016-01-15 DIAGNOSIS — M2140 Flat foot [pes planus] (acquired), unspecified foot: Secondary | ICD-10-CM | POA: Diagnosis not present

## 2016-01-15 DIAGNOSIS — E1140 Type 2 diabetes mellitus with diabetic neuropathy, unspecified: Secondary | ICD-10-CM

## 2016-01-15 NOTE — Patient Instructions (Signed)

## 2016-01-15 NOTE — Progress Notes (Signed)
Patient ID: Eric Cummings, male   DOB: 07/03/1965, 50 y.o.   MRN: [id number]  Patient presents for diabetic shoe pick up, shoes are tried on for good fit.  Patient received 1 pair Apex G8010M Active walker black in men's size 12 wide and 3 pairs custom molded diabetic inserts.  Verbal and written break in and wear instructions given.  Patient will follow up for scheduled routine care. Superficial tried. Blister bottom of the left hallux. There is no underlying ulceration, drainage or other signs of infection. No swelling erythema, ascending cellulitis. Follow-up with this area is not completely healed within 2 weeks or sooner if any stature arise. Otherwise I'll follow-up with him for routine care.  Ovid CurdMatthew Wagoner, DPM

## 2016-02-28 ENCOUNTER — Ambulatory Visit: Payer: Medicare Other

## 2016-02-28 ENCOUNTER — Ambulatory Visit (INDEPENDENT_AMBULATORY_CARE_PROVIDER_SITE_OTHER): Payer: Medicare Other | Admitting: Podiatry

## 2016-02-28 ENCOUNTER — Ambulatory Visit (INDEPENDENT_AMBULATORY_CARE_PROVIDER_SITE_OTHER): Payer: Medicare Other

## 2016-02-28 DIAGNOSIS — M25572 Pain in left ankle and joints of left foot: Secondary | ICD-10-CM | POA: Diagnosis not present

## 2016-02-28 DIAGNOSIS — E1140 Type 2 diabetes mellitus with diabetic neuropathy, unspecified: Secondary | ICD-10-CM

## 2016-02-28 DIAGNOSIS — R52 Pain, unspecified: Secondary | ICD-10-CM

## 2016-02-28 DIAGNOSIS — B351 Tinea unguium: Secondary | ICD-10-CM

## 2016-02-28 DIAGNOSIS — M79676 Pain in unspecified toe(s): Secondary | ICD-10-CM

## 2016-02-28 DIAGNOSIS — M2140 Flat foot [pes planus] (acquired), unspecified foot: Secondary | ICD-10-CM

## 2016-02-28 NOTE — Progress Notes (Signed)
Patient ID: Eric Cummings, male   DOB: 08/26/1965, 50 y.o.   MRN: [id number]  Subjective: 50 y.o.-year-old male returns the office today for painful, elongated, thickened toenails which he is unable to trim himself. He presents today with his sister who is his caregiver. His sister states that he has been limping for some time and favoring the left foot. Eyes any swelling or redness to the area. Denies any recent injury or trauma. Denies any acute changes since last appointment and no new complaints today. Denies any systemic complaints such as fevers, chills, nausea, vomiting.   Objective: Awake, NAD; nonverbal and presents with caregiver  DP/PT pulses palpable, CRT less than 3 seconds Nails hypertrophic, dystrophic, elongated, brittle, discolored 10. There is incurvation of the nails. There is tenderness overlying the nails 1-5 bilaterally. There is no surrounding erythema or drainage along the nail sites. No open lesions or pre-ulcerative lesions are identified. There is a significant decrease in the arch and left foot. There does not appear to be any area pinpoint tenderness amenable to elicit any area of tenderness to left foot states visit. There is no edema, erythema.  No other areas of tenderness bilateral lower extremities. No overlying edema, erythema, increased warmth. Hammertoe deformity present as well as flatfoot. No pain with calf compression, swelling, warmth, erythema.  Assessment: Patient presents with symptomatic onychomycosis; flatfoot deformity  Plan: -Treatment options including alternatives, risks, complications were discussed -Nails sharply debrided 10 without complication/bleeding. -Aid and was diabetic shoes and inserts she is still limping. He likely benefit from a brace we'll start with an over-the-counter ankle brace. Discussed with him custom brace if needed in the future. -Discussed daily foot inspection. If there are any changes, to call the office  immediately.  -Follow-up in 4 weeks or sooner if any problems are to arise. In the meantime, encouraged to call the office with any questions, concerns, changes symptoms.  Ovid CurdMatthew Kynli Chou, DPM

## 2016-03-09 ENCOUNTER — Telehealth: Payer: Self-pay | Admitting: *Deleted

## 2016-03-09 NOTE — Telephone Encounter (Signed)
Refill request received for [facility] antiinflammatory cream. Dr. Ardelle AntonWagoner request pt make an appt for follow up if continuing to have problems, refilled once.

## 2016-03-20 ENCOUNTER — Ambulatory Visit: Payer: Medicare Other | Admitting: Podiatry

## 2016-03-27 ENCOUNTER — Ambulatory Visit: Payer: Medicare Other | Admitting: Podiatry

## 2016-04-03 ENCOUNTER — Encounter: Payer: Self-pay | Admitting: Podiatry

## 2016-04-03 ENCOUNTER — Ambulatory Visit (INDEPENDENT_AMBULATORY_CARE_PROVIDER_SITE_OTHER): Payer: Medicare Other | Admitting: Podiatry

## 2016-04-03 VITALS — BP 110/65 | HR 81 | Resp 14

## 2016-04-03 DIAGNOSIS — M722 Plantar fascial fibromatosis: Secondary | ICD-10-CM

## 2016-04-03 DIAGNOSIS — B351 Tinea unguium: Secondary | ICD-10-CM | POA: Diagnosis not present

## 2016-04-03 DIAGNOSIS — M79676 Pain in unspecified toe(s): Secondary | ICD-10-CM

## 2016-04-03 DIAGNOSIS — E1140 Type 2 diabetes mellitus with diabetic neuropathy, unspecified: Secondary | ICD-10-CM

## 2016-04-05 NOTE — Progress Notes (Signed)
Patient ID: Eric Cummings L Kelleher, male   DOB: 08/22/1965, 50 y.o.   MRN: [id number]  Subjective: 50 y.o.-year-old male returns the office today for painful, elongated, thickened toenails which he is unable to trim himself. He presents today with his sister who is his caregiver. She states that Eric Cummings has been having pain to his left foot in the morning when he first gets up to the point where he'll cry. As he starts to walk the pain does ease up does not seem to have as many problems. They've notany swelling or redness to the foot.  Objective: Awake, NAD; nonverbal and presents with caregiver  DP/PT pulses palpable, CRT less than 3 seconds Nails hypertrophic, dystrophic, elongated, brittle, discolored 10. There is incurvation of the nails. There is tenderness overlying the nails 1-5 bilaterally. There is no surrounding erythema or drainage along the nail sites. No open lesions or pre-ulcerative lesions are identified. There is a significant decrease in the arch and left foot. I an unable to elicit any tenderness the left foot today any areas of pinpoint bony tenderness or along the plantar fascia.  No other areas of tenderness bilateral lower extremities. No overlying edema, erythema, increased warmth. Hammertoe deformity present as well as flatfoot. No pain with calf compression, swelling, warmth, erythema.  Assessment: Patient presents with symptomatic onychomycosis; flatfoot deformity; possible plantar fasciitis left side  Plan: -Treatment options including alternatives, risks, complications were discussed -Nails sharply debrided 10 without complication/bleeding. -I discussed physical therapy. Given his medical conditions I will order home physical therapy to see if they can help with plantar fasciitis to see this help eliminate symptoms that he appears to be having. Unable to elicit any response from him today in regards to pain. -Discussed daily foot inspection. If there are any changes,  to call the office immediately.  -Follow-up in 9 weeks or sooner if any problems are to arise. In the meantime, encouraged to call the office with any questions, concerns, changes symptoms.  Ovid CurdMatthew Wagoner, DPM

## 2016-04-06 NOTE — Addendum Note (Signed)
Addended by: Alphia Kava'CONNELL, Chrissie Dacquisto D on: 04/06/2016 08:42 AM   Modules accepted: Orders

## 2016-04-07 ENCOUNTER — Telehealth: Payer: Self-pay | Admitting: *Deleted

## 2016-04-07 DIAGNOSIS — M722 Plantar fascial fibromatosis: Secondary | ICD-10-CM

## 2016-04-07 NOTE — Telephone Encounter (Signed)
Tiffany - [facility] states pt's insurance is out of network can not accept. Aurther Lofterry [facility] states pt is in network with them. I told her I would fax the orders. Done.

## 2016-04-16 ENCOUNTER — Telehealth: Payer: Self-pay | Admitting: *Deleted

## 2016-04-16 DIAGNOSIS — R2681 Unsteadiness on feet: Secondary | ICD-10-CM

## 2016-04-16 DIAGNOSIS — E1340 Other specified diabetes mellitus with diabetic neuropathy, unspecified: Secondary | ICD-10-CM

## 2016-04-16 NOTE — Telephone Encounter (Addendum)
Bramod, PT states needs verbal order for PT 2 x week for 4 weeks. I okayed. 06/23/2016-Nichole - [facility] request copy of PT rx.06/24/2016-Orders [facility] evaluation and treatment [facility] diabetic neuropathy and gait [facility]

## 2016-05-15 ENCOUNTER — Encounter: Payer: Self-pay | Admitting: Podiatry

## 2016-05-15 ENCOUNTER — Ambulatory Visit (INDEPENDENT_AMBULATORY_CARE_PROVIDER_SITE_OTHER): Payer: Medicare Other | Admitting: Podiatry

## 2016-05-15 DIAGNOSIS — M79676 Pain in unspecified toe(s): Secondary | ICD-10-CM

## 2016-05-15 DIAGNOSIS — B351 Tinea unguium: Secondary | ICD-10-CM

## 2016-05-15 DIAGNOSIS — E1140 Type 2 diabetes mellitus with diabetic neuropathy, unspecified: Secondary | ICD-10-CM

## 2016-05-15 DIAGNOSIS — R2681 Unsteadiness on feet: Secondary | ICD-10-CM

## 2016-05-18 NOTE — Progress Notes (Signed)
Subjective: 50 y.o. returns the office today for painful, elongated, thickened toenails which he cannot trim himself. Denies any redness or drainage around the nails. His sister who he lives with states that they were unable to do home physical therapy due to time constraints and they recommended formal physical therapy. They state that he is off balance and he may need an assistive device but recommended formal physical therapy. The patient does not seem to have as much pain as he has had previously. He hasn't worn ankle brace lifted break and is asking for new one today. Denies any acute changes since last appointment and no new complaints today. Denies any systemic complaints such as fevers, chills, nausea, vomiting.   Objective: AAO 3, NAD DP/PT pulses palpable, CRT less than 3 seconds  Nails hypertrophic, dystrophic, elongated, brittle, discolored 10. There is tenderness overlying the nails 1-5 bilaterally. There is no surrounding erythema or drainage along the nail sites. No open lesions or pre-ulcerative lesions are identified. I am unable to elicit any area of tenderness to palpation of bilateral feet. There is no overlying edema, erythema, increase in warmth. Decrease in medial arch height upon weightbearing. No pain with calf compression, swelling, warmth, erythema.  Assessment: Patient presents with symptomatic onychomycosis; left foot pain  Plan: -Treatment options including alternatives, risks, complications were discussed -Nails sharply debrided 10 without complication/bleeding. -Rx for [facility] PT given to the patient today.  -Discussed daily foot inspection. If there are any changes, to call the office immediately.  -Follow-up in 9 weeks or sooner if any problems are to arise. In the meantime, encouraged to call the office with any questions, concerns, changes symptoms.  Ovid CurdMatthew Aislee Landgren, DPM

## 2016-06-24 ENCOUNTER — Telehealth: Payer: Self-pay | Admitting: Podiatry

## 2016-06-24 ENCOUNTER — Encounter: Payer: Self-pay | Admitting: Podiatry

## 2016-06-24 NOTE — Progress Notes (Signed)
Augusto GambleAnthony Hendry, patient's legal guardian picked up requested medical records.

## 2016-06-24 NOTE — Telephone Encounter (Signed)
Someone left a voicemail at 8:14 am today on the nurse line requesting a call back about notes. I called the number back and left a message asking them to call me back to see how I can help them. Person did not leave name when leaving message on patient's behalf.

## 2016-06-29 ENCOUNTER — Ambulatory Visit: Payer: Medicare Other | Admitting: Podiatry

## 2016-07-20 ENCOUNTER — Ambulatory Visit (INDEPENDENT_AMBULATORY_CARE_PROVIDER_SITE_OTHER): Payer: Medicare Other | Admitting: Podiatry

## 2016-07-20 DIAGNOSIS — B351 Tinea unguium: Secondary | ICD-10-CM | POA: Diagnosis not present

## 2016-07-20 DIAGNOSIS — M79676 Pain in unspecified toe(s): Secondary | ICD-10-CM

## 2016-07-20 DIAGNOSIS — E1140 Type 2 diabetes mellitus with diabetic neuropathy, unspecified: Secondary | ICD-10-CM

## 2016-07-21 NOTE — Progress Notes (Signed)
Subjective: 51 y.o. returns the office today for painful, elongated, thickened toenails which he cannot trim himself. Denies any redness or drainage around the nails. He has been going to PT but will be discharged soon his sister states. He still has pain at night and in the morning and does cry and rub his left foot on the ground. PT believes that this is neuropathy. His sister states that the cream was helping a lot but due to cost they are unable to get it. Denies any acute changes since last appointment and no new complaints today. Denies any systemic complaints such as fevers, chills, nausea, vomiting.   Objective: AAO 3, NAD DP/PT pulses palpable, CRT less than 3 seconds  Nails hypertrophic, dystrophic, elongated, brittle, discolored 10. There is tenderness overlying the nails 1-5 bilaterally. There is no surrounding erythema or drainage along the nail sites. No open lesions or pre-ulcerative lesions are identified. I am not able to elicit any area of tenderness to palpation of bilateral feet. There is no overlying edema, erythema, increase in warmth. Decrease in medial arch height upon weightbearing. No pain with calf compression, swelling, warmth, erythema.  Assessment: Patient presents with symptomatic onychomycosis; left foot pain  Plan: -Treatment options including alternatives, risks, complications were discussed -Nails sharply debrided 10 without complication/bleeding. -I will contact [facility] about the cream to see if there is an alternative or if there is a cheaper option.   -Discussed daily foot inspection. If there are any changes, to call the office immediately.  -Follow-up in 9 weeks or sooner if any problems are to arise. In the meantime, encouraged to call the office with any questions, concerns, changes symptoms.  Ovid CurdMatthew Consuela Widener, DPM

## 2016-08-07 ENCOUNTER — Ambulatory Visit: Payer: Medicare Other | Admitting: Podiatry

## 2016-10-19 ENCOUNTER — Ambulatory Visit: Payer: Medicare Other | Admitting: Podiatry

## 2017-04-05 ENCOUNTER — Telehealth: Payer: Self-pay | Admitting: Podiatry

## 2017-04-05 NOTE — Telephone Encounter (Signed)
Called pt's sister back from message left earlier today. Left voicemail asking for pt's sister's name, but that I also would need a signed DPR by pt's brother Augusto Gamblenthony Clemence because we are showing he is the pt's legal guardian. Stated since we do not have a signed DPR in the pt's chart I am unable to release requested records. Told to call me with any questions at [phone] and if I do not answer to leave me a message and I would return the phone call.

## 2017-04-05 NOTE — Telephone Encounter (Signed)
I am calling for my brother. I am needing to get a print out of all of his visits where he has been seen in 2018 along with diagnosis for his foot for his neuropathy. I need this information to give to his social worker for his yearly review. Please call me back at [phone].

## 2017-04-09 ENCOUNTER — Encounter: Payer: Self-pay | Admitting: Podiatry

## 2017-04-09 ENCOUNTER — Ambulatory Visit (INDEPENDENT_AMBULATORY_CARE_PROVIDER_SITE_OTHER): Payer: Medicare Other | Admitting: Podiatry

## 2017-04-09 DIAGNOSIS — E1140 Type 2 diabetes mellitus with diabetic neuropathy, unspecified: Secondary | ICD-10-CM | POA: Diagnosis not present

## 2017-04-09 DIAGNOSIS — M79676 Pain in unspecified toe(s): Secondary | ICD-10-CM

## 2017-04-09 DIAGNOSIS — R2681 Unsteadiness on feet: Secondary | ICD-10-CM

## 2017-04-09 DIAGNOSIS — B351 Tinea unguium: Secondary | ICD-10-CM | POA: Diagnosis not present

## 2017-04-12 NOTE — Progress Notes (Signed)
Subjective: 51 y.o. returns the office today for painful, elongated, thickened toenails which he cannot trim himself. Denies any redness or drainage around the nails.  The dosage of his gabapentin has been increased as it felt that his neuropathy is getting worse.  Also his sister who accompanies him today states that she is concerned that he is going to fall and that he may be off balance.  He does have caregivers that help him during the day.  He has not had any recent fall but they are scheduled he might get imbalance.  No other concerns today. Denies any acute changes since last appointment and no new complaints today. Denies any systemic complaints such as fevers, chills, nausea, vomiting.   Objective: AAO 3, NAD DP/PT pulses palpable, CRT less than 3 seconds  Nails hypertrophic, dystrophic, elongated, brittle, discolored 10. There is tenderness overlying the nails 1-5 bilaterally. There is no surrounding erythema or drainage along the nail sites. No open lesions or pre-ulcerative lesions are identified. There is appear to be any area of tenderness identified bilaterally.  There is a decreased medial arch height on weightbearing.  There is no overlying edema, erythema, increase in warmth bilaterally No pain with calf compression, swelling, warmth, erythema.  Assessment: Patient presents with symptomatic onychomycosis; gait instability, neuropathy  Plan: -Treatment options including alternatives, risks, complications were discussed -Nails sharply debrided 10 without complication/bleeding. -Continue gabapentin. -In regards to the gait instability we will do physical therapy for fall assessment and gait training.  Prescription was provided today for [facility].  Recommend to continue with aids while at home -Discussed daily foot inspection. If there are any changes, to call the office immediately.  -Follow-up in 9 weeks or sooner if any problems are to arise. In the meantime,  encouraged to call the office with any questions, concerns, changes symptoms.  Ovid CurdMatthew Wagoner, DPM

## 2017-04-13 ENCOUNTER — Telehealth: Payer: Self-pay | Admitting: Podiatry

## 2017-04-13 NOTE — Telephone Encounter (Signed)
Hi Shanda BumpsJessica, this is Ethelene Brownsnthony returning your call in regards to my brothers medical records. You can call me back at home [phone] or on my mobile [phone].

## 2017-04-13 NOTE — Telephone Encounter (Signed)
Called Ethelene Brownsnthony back and spoke to him about the medical records request for his brother. I verified with Ethelene Brownsnthony that they do have a sister named [place]. I explained that since he is the legal guardian he would need to sign a new DPR to allow us to release information to [facility] and that until that is completed I cannot release his records to her. Ethelene Brownsnthony stated he would come in tomorrow to sign a new DPR and I told him I would the records, a blank DPR form, and a blank medical records release form up front for him tomorrow to fill out and sign.

## 2017-04-13 NOTE — Telephone Encounter (Signed)
I called pt's brother Augusto Gamblenthony Mercer who is also his legal guardian and left him a voicemail. I asked him to call me back directly at [phone]. I told him I needed to speak to him about a medical records request on his brother.

## 2017-08-09 ENCOUNTER — Encounter: Payer: Self-pay | Admitting: Podiatry

## 2017-08-09 ENCOUNTER — Ambulatory Visit (INDEPENDENT_AMBULATORY_CARE_PROVIDER_SITE_OTHER): Payer: Medicare Other | Admitting: Podiatry

## 2017-08-09 DIAGNOSIS — B351 Tinea unguium: Secondary | ICD-10-CM | POA: Diagnosis not present

## 2017-08-09 DIAGNOSIS — R21 Rash and other nonspecific skin eruption: Secondary | ICD-10-CM

## 2017-08-09 DIAGNOSIS — E1140 Type 2 diabetes mellitus with diabetic neuropathy, unspecified: Secondary | ICD-10-CM | POA: Diagnosis not present

## 2017-08-09 DIAGNOSIS — M79676 Pain in unspecified toe(s): Secondary | ICD-10-CM | POA: Diagnosis not present

## 2017-08-09 MED ORDER — TRIAMCINOLONE ACETONIDE 0.025 % EX OINT: 1.0000 "application " | TOPICAL_OINTMENT | Freq: Two times a day (BID) | CUTANEOUS | 0 refills | Status: DC

## 2017-08-14 NOTE — Progress Notes (Signed)
Subjective: 52 y.o. returns the office today for painful, elongated, thickened toenails which he cannot trim himself.  He presents today with his family member.  She states that she has noticed a rash on his legs.  She is concerned this may be diabetic related has been eating quite a bit of sweets recently.  Denies any itching of the rash they have had no treatment for this.  He is still doing physical therapy has been making progress with this. Denies any acute changes since last appointment and no new complaints today. Denies any systemic complaints such as fevers, chills, nausea, vomiting.   Objective: AAO 3, NAD DP/PT pulses palpable, CRT less than 3 seconds  Nails hypertrophic, dystrophic, elongated, brittle, discolored 10. There is tenderness overlying the nails 1-5 bilaterally. There is no surrounding erythema or drainage along the nail sites. On the medial aspect of bilateral thighs is a large erythematous rash with some dry skin present.  It appears it appears to be almost a psoriasis type rash. No open lesions or pre-ulcerative lesions are identified. There is appear to be any area of tenderness identified bilaterally.  There is a decreased medial arch height on weightbearing.  There is no overlying edema, erythema, increase in warmth bilaterally No pain with calf compression, swelling, warmth, erythema.  Assessment: Patient presents with symptomatic onychomycosis; gait instability, neuropathy skin rash;   Plan: -Treatment options including alternatives, risks, complications were discussed -Nails sharply debrided 10 without complication/bleeding. -I ordered triamcinolone cream for the rash.  If this is not improving with the steroid cream will refer to dermatology. -Continue gabapentin -Continue with PT- he has been making good progress with this and he enjoys going.   Vivi BarrackMatthew R Annison Birchard DPM

## 2017-10-11 ENCOUNTER — Ambulatory Visit (INDEPENDENT_AMBULATORY_CARE_PROVIDER_SITE_OTHER): Payer: Medicare Other | Admitting: Podiatry

## 2017-10-11 ENCOUNTER — Encounter: Payer: Self-pay | Admitting: Podiatry

## 2017-10-11 DIAGNOSIS — B351 Tinea unguium: Secondary | ICD-10-CM | POA: Diagnosis not present

## 2017-10-11 DIAGNOSIS — M79676 Pain in unspecified toe(s): Secondary | ICD-10-CM

## 2017-10-11 DIAGNOSIS — E1140 Type 2 diabetes mellitus with diabetic neuropathy, unspecified: Secondary | ICD-10-CM | POA: Diagnosis not present

## 2017-10-13 NOTE — Progress Notes (Signed)
Subjective: 53 y.o. returns the office today for painful, elongated, thickened toenails which he cannot trim himself.  They have noticed any redness or drainage from the toenail sites but they are causing irritation she is getting elongated.  His family who accompanies him today states that when he is in the program there is stating that he is not lifting his feet to walk.  He has been doing physical therapy and she is decrease this to once a week which she states that he is doing very well in therapy and he is responding very well when he goes.  He still has a shuffling sensation when he states he rubs his feet on the ground.Denies any systemic complaints such as fevers, chills, nausea, vomiting.   Objective: AAO 3, NAD DP/PT pulses palpable, CRT less than 3 seconds  Nails hypertrophic, dystrophic, elongated, brittle, discolored 10. There is tenderness overlying the nails 1-5 bilaterally. There is no surrounding erythema or drainage along the nail sites. No open lesions or pre-ulcerative lesions are identified. There is appear to be any area of tenderness identified bilaterally.  There is a decreased medial arch height on weightbearing.  There is no overlying edema, erythema, increase in warmth bilaterally No pain with calf compression, swelling, warmth, erythema.  Assessment: Patient presents with symptomatic onychomycosis; gait instability  Plan: -Treatment options including alternatives, risks, complications were discussed -Nails sharply debrided 10 without complication/bleeding. -I will continue with physical therapy.  He has been responding very well with this.  We will also do a fall assessment. Can consider a Restaurant manager, fast food to help with his gait if needed. -Continue gabapentin  Vivi Barrack DPM

## 2018-01-11 ENCOUNTER — Ambulatory Visit: Payer: Medicare Other | Admitting: Podiatry

## 2018-01-21 ENCOUNTER — Ambulatory Visit (INDEPENDENT_AMBULATORY_CARE_PROVIDER_SITE_OTHER): Payer: Medicare Other | Admitting: Podiatry

## 2018-01-21 ENCOUNTER — Encounter: Payer: Self-pay | Admitting: Podiatry

## 2018-01-21 DIAGNOSIS — M79676 Pain in unspecified toe(s): Secondary | ICD-10-CM | POA: Diagnosis not present

## 2018-01-21 DIAGNOSIS — R2681 Unsteadiness on feet: Secondary | ICD-10-CM | POA: Diagnosis not present

## 2018-01-21 DIAGNOSIS — E1140 Type 2 diabetes mellitus with diabetic neuropathy, unspecified: Secondary | ICD-10-CM

## 2018-01-21 DIAGNOSIS — B351 Tinea unguium: Secondary | ICD-10-CM | POA: Diagnosis not present

## 2018-01-21 NOTE — Progress Notes (Signed)
Subjective: 52 y.o. returns the office today for painful, elongated, thickened toenails which he cannot trim himself.  They have noticed any redness or drainage from the toenail sites but they are causing irritation she is getting elongated.  His sister also relates that he been having some balance issues.  He has to hold onto things to avoid any falls.  She is not sure if this is coming from the neuropathy but she is fearful that he is in the fall.  Objective: AAO 3, NAD DP/PT pulses palpable, CRT less than 3 seconds  Nails hypertrophic, dystrophic, elongated, brittle, discolored 10. There is tenderness overlying the nails 1-5 bilaterally. There is no surrounding erythema or drainage along the nail sites. No open lesions or pre-ulcerative lesions are identified. There is appear to be any area of tenderness identified bilaterally.  There is a decreased medial arch height on weightbearing.  There is no overlying edema, erythema, increase in warmth bilaterally No pain with calf compression, swelling, warmth, erythema.  Assessment: Patient presents with symptomatic onychomycosis; gait instability  Plan: -Treatment options including alternatives, risks, complications were discussed -Nails sharply debrided 10 without complication/bleeding. -Given the gait instability I do think she will benefit from a Moore balance brace.  [facility]. -Continue gabapentin -Follow-up in 3 months for routine care sooner if any issues are to arise.  Vivi BarrackMatthew R Wagoner DPM

## 2018-01-25 ENCOUNTER — Ambulatory Visit: Payer: Medicare Other | Admitting: Orthotics

## 2018-01-25 DIAGNOSIS — M79676 Pain in unspecified toe(s): Secondary | ICD-10-CM

## 2018-01-25 DIAGNOSIS — R2681 Unsteadiness on feet: Secondary | ICD-10-CM

## 2018-01-25 DIAGNOSIS — B351 Tinea unguium: Secondary | ICD-10-CM

## 2018-01-25 DIAGNOSIS — E1140 Type 2 diabetes mellitus with diabetic neuropathy, unspecified: Secondary | ICD-10-CM

## 2018-01-25 NOTE — Progress Notes (Signed)
Patient came in today for Eval/assessment for Moore Balance Brace.  Patient presents with a history of falling, and also fearful of falling.  Balance tests performed and patient does have issues keeping balance especially in foot to heel test.  Patient has week dorsiflexors and well as inverters/everters muscle group.  Demonstrants ankle instability.   

## 2018-02-23 ENCOUNTER — Ambulatory Visit: Payer: Medicare Other | Admitting: Orthotics

## 2018-02-23 DIAGNOSIS — E1140 Type 2 diabetes mellitus with diabetic neuropathy, unspecified: Secondary | ICD-10-CM

## 2018-02-23 DIAGNOSIS — R2681 Unsteadiness on feet: Secondary | ICD-10-CM | POA: Diagnosis not present

## 2018-02-23 NOTE — Progress Notes (Signed)
Patient came in today to pick up Moore Balance Brace (Bilateral).   Patient was able to don brace independently and brace was check for fit to custom.   Patient was observed walking with brace and gait was improved as well as stability.   Patient was advised of care and wearing instructions.  Advised to notify practice if there were any issues; especially skin irritation.  

## 2018-04-04 DIAGNOSIS — Z1389 Encounter for screening for other disorder: Secondary | ICD-10-CM | POA: Insufficient documentation

## 2018-04-08 ENCOUNTER — Ambulatory Visit: Payer: Medicare Other | Admitting: Podiatry

## 2018-04-11 ENCOUNTER — Ambulatory Visit: Payer: Medicare Other | Admitting: Podiatry

## 2018-04-18 ENCOUNTER — Ambulatory Visit (INDEPENDENT_AMBULATORY_CARE_PROVIDER_SITE_OTHER): Payer: Medicare Other | Admitting: Podiatry

## 2018-04-18 DIAGNOSIS — R2681 Unsteadiness on feet: Secondary | ICD-10-CM | POA: Diagnosis not present

## 2018-04-18 DIAGNOSIS — M79676 Pain in unspecified toe(s): Secondary | ICD-10-CM

## 2018-04-18 DIAGNOSIS — E1140 Type 2 diabetes mellitus with diabetic neuropathy, unspecified: Secondary | ICD-10-CM | POA: Diagnosis not present

## 2018-04-18 DIAGNOSIS — B351 Tinea unguium: Secondary | ICD-10-CM | POA: Diagnosis not present

## 2018-04-25 NOTE — Progress Notes (Signed)
Subjective: 52 y.o. returns the office today for painful, elongated, thickened toenails which he cannot trim himself.  He also presents today for follow-up evaluation after getting his balance braces.  His sister states that his balance is much improved with the braces but if he does not wear them she notices that his balance is not as good.  His sister also states that at the day program where he goes he is started to go back to not being as active.  They are not sure what is causing this.  Denies any recent injury or falls.  Objective: AAO 3, NAD DP/PT pulses palpable, CRT less than 3 seconds  Nails hypertrophic, dystrophic, elongated, brittle, discolored 10. There is tenderness overlying the nails 1-5 bilaterally. There is no surrounding erythema or drainage along the nail sites. No open lesions or pre-ulcerative lesions are identified. Upon palpation bilaterally not able to elicit any area of tenderness like identified today.  There is no edema, erythema.  Braces appear to be fitting well. No pain with calf compression, swelling, warmth, erythema.  Assessment: Patient presents with symptomatic onychomycosis; gait instability  Plan: -Treatment options including alternatives, risks, complications were discussed -Nails sharply debrided 10 without complication/bleeding. -Braces appear to be fitting well and are helping.  I medicine him back to physical therapy and discussed this with Hale BogusMara from [facility]. He did very well when he went to therapy and hopefully this will help.  -Continue gabapentin -Follow-up in 3 months for routine care sooner if any issues are to arise.  Vivi BarrackMatthew R Maribelle Hopple DPM

## 2018-04-27 ENCOUNTER — Ambulatory Visit: Payer: Medicare Other | Admitting: Podiatry

## 2018-05-18 ENCOUNTER — Ambulatory Visit: Payer: Medicare Other | Admitting: Podiatry

## 2018-05-26 ENCOUNTER — Telehealth: Payer: Self-pay | Admitting: *Deleted

## 2018-05-26 NOTE — Telephone Encounter (Signed)
Eric Cummings - [facility]-In-office states referral from 04/18/2018 and pt's caregiver has called to schedule. Eric Cummings states she would like a new rx form. [facility].

## 2018-06-06 ENCOUNTER — Ambulatory Visit: Payer: Medicare Other | Admitting: Podiatry

## 2018-06-08 ENCOUNTER — Ambulatory Visit (INDEPENDENT_AMBULATORY_CARE_PROVIDER_SITE_OTHER): Payer: Medicare Other | Admitting: Podiatry

## 2018-06-08 DIAGNOSIS — M79675 Pain in left toe(s): Secondary | ICD-10-CM | POA: Diagnosis not present

## 2018-06-08 DIAGNOSIS — M79674 Pain in right toe(s): Secondary | ICD-10-CM

## 2018-06-08 DIAGNOSIS — B351 Tinea unguium: Secondary | ICD-10-CM

## 2018-06-08 MED ORDER — TRIAMCINOLONE ACETONIDE 0.025 % EX OINT
1.0000 | TOPICAL_OINTMENT | Freq: Two times a day (BID) | CUTANEOUS | 0 refills | Status: DC
Start: 2018-06-08 — End: 2024-03-20

## 2018-06-08 NOTE — Patient Instructions (Signed)

## 2018-06-30 ENCOUNTER — Encounter: Payer: Self-pay | Admitting: Podiatry

## 2018-06-30 NOTE — Progress Notes (Signed)
Subjective: Eric Cummings presents today with painful, thick toenails 1-5 b/l that he cannot cut and which interfere with daily activities.  Pain is aggravated when wearing enclosed shoe gear.  He relates he needs refill for rash on his lower extremities.  Creola Cornusso, John, MD is his PCP.   Current Outpatient Medications:  .  amLODipine (NORVASC) 10 MG tablet, Take 10 mg by mouth daily., Disp: , Rfl:  .  BD INSULIN SYRINGE U/F 30G X 1/2" 0.5 ML MISC, USE UNDER THE SKIN UP TO 3 TIMES A DAY, Disp: , Rfl: 3 .  Canagliflozin 100 MG TABS, Take by mouth., Disp: , Rfl:  .  fenofibrate 160 MG tablet, Take 160 mg by mouth daily., Disp: , Rfl:  .  glyBURIDE-metformin (GLUCOVANCE) 2.5-500 MG per tablet, Take 1 tablet by mouth daily with breakfast., Disp: , Rfl:  .  HUMALOG 100 UNIT/ML injection, , Disp: , Rfl: 3 .  insulin glargine (LANTUS) 100 UNIT/ML injection, Inject into the skin at bedtime., Disp: , Rfl:  .  Loratadine (CLARITIN) 10 MG CAPS, Take by mouth., Disp: , Rfl:  .  NONFORMULARY OR COMPOUNDED ITEM, [facility] compound:  Peripheral Neuropathy cream - Bupivacaine 1%, Doxepin 3%, Gabapentin 6%, Pentoxifylline 3%, Topiramate 1%, dispense 120 grams, apply 1-2 grams to affected area 3-4 times daily, +2refills., Disp: 120 each, Rfl: 2 .  olmesartan (BENICAR) 20 MG tablet, Take 20 mg by mouth daily., Disp: , Rfl:  .  omeprazole (PRILOSEC) 20 MG capsule, Take 20 mg by mouth daily., Disp: , Rfl:  .  sitaGLIPtin (JANUVIA) 100 MG tablet, Take 100 mg by mouth daily., Disp: , Rfl:  .  triamcinolone (KENALOG) 0.025 % ointment, Apply 1 application topically 2 (two) times daily., Disp: 30 g, Rfl: 0 .  triazolam (HALCION) 0.125 MG tablet, TAKE 3 TABLETS BY MOUTH PRIOR TO DENTAL PROCEDURE AS DIRECTED BY DENTAL STAFF, Disp: , Rfl: 0  No Known Allergies  Objective:  Vascular Examination: Capillary refill time <3 seconds x 10 digits  Dorsalis pedis and Posterior tibial pulses palpable  b/l  Digital hair sparse x 10 digits  Skin temperature gradient WNL b/l  Dermatological Examination: Skin with normal turgor, texture and tone b/l  Toenails 1-5 b/l discolored, thick, dystrophic with subungual debris and pain with palpation to nailbeds due to thickness of nails.   No open wounds b/l   Musculoskeletal: Muscle strength 5/5 to all LE muscle groups  No gross bony deformities b/l.  No pain, crepitus or joint limitation noted with ROM.   Neurological: Sensation decreased with 10 gram monofilament b/l  Assessment: Painful onychomycosis toenails 1-5 b/l   Plan: 1. Toenails 1-5 b/l were debrided in length and girth without iatrogenic bleeding. 2. Triamcinolone Ointment reordered. 3. Patient to continue soft, supportive shoe gear 4. Patient to report any pedal injuries to medical professional immediately. 5. Follow up 3 months. Patient/POA to call should there be a concern in the interim.

## 2018-09-07 ENCOUNTER — Ambulatory Visit: Payer: Medicare Other | Admitting: Podiatry

## 2018-11-21 ENCOUNTER — Ambulatory Visit (INDEPENDENT_AMBULATORY_CARE_PROVIDER_SITE_OTHER): Payer: Medicare Other | Admitting: Podiatry

## 2018-11-21 ENCOUNTER — Encounter: Payer: Self-pay | Admitting: Podiatry

## 2018-11-21 ENCOUNTER — Other Ambulatory Visit: Payer: Self-pay

## 2018-11-21 DIAGNOSIS — B351 Tinea unguium: Secondary | ICD-10-CM

## 2018-11-21 DIAGNOSIS — M79674 Pain in right toe(s): Secondary | ICD-10-CM

## 2018-11-21 DIAGNOSIS — M79675 Pain in left toe(s): Secondary | ICD-10-CM

## 2018-11-21 NOTE — Patient Instructions (Addendum)

## 2018-11-30 NOTE — Progress Notes (Signed)
Subjective:  Eric Cummings presents to clinic today with cc of  painful, thick, discolored, elongated toenails 1-5 b/l that become tender and cannot cut because of thickness.  Pain is aggravated when wearing enclosed shoe gear.  Shon Baton, MD is his PCP.  Last visit was March of this year per patient recall.   Current Outpatient Medications:  .  amLODipine (NORVASC) 10 MG tablet, Take 10 mg by mouth daily., Disp: , Rfl:  .  BD INSULIN SYRINGE U/F 30G X 1/2" 0.5 ML MISC, USE UNDER THE SKIN UP TO 3 TIMES A DAY, Disp: , Rfl: 3 .  Canagliflozin 100 MG TABS, Take by mouth., Disp: , Rfl:  .  fenofibrate 160 MG tablet, Take 160 mg by mouth daily., Disp: , Rfl:  .  gabapentin (NEURONTIN) 100 MG capsule, TK 1 C PO IN MORNING AND 3 CAPSULE HS, Disp: , Rfl:  .  glyBURIDE-metformin (GLUCOVANCE) 2.5-500 MG per tablet, Take 1 tablet by mouth daily with breakfast., Disp: , Rfl:  .  HUMALOG 100 UNIT/ML injection, , Disp: , Rfl: 3 .  insulin glargine (LANTUS) 100 UNIT/ML injection, Inject into the skin at bedtime., Disp: , Rfl:  .  Loratadine (CLARITIN) 10 MG CAPS, Take by mouth., Disp: , Rfl:  .  metFORMIN (GLUCOPHAGE-XR) 500 MG 24 hr tablet, TK 2 TS PO QAM AND 1 T QPM, Disp: , Rfl:  .  NONFORMULARY OR COMPOUNDED ITEM, [place] [facility] compound:  Peripheral Neuropathy cream - Bupivacaine 1%, Doxepin 3%, Gabapentin 6%, Pentoxifylline 3%, Topiramate 1%, dispense 120 grams, apply 1-2 grams to affected area 3-4 times daily, +2refills., Disp: 120 each, Rfl: 2 .  olmesartan (BENICAR) 20 MG tablet, Take 20 mg by mouth daily., Disp: , Rfl:  .  omeprazole (PRILOSEC) 20 MG capsule, Take 20 mg by mouth daily., Disp: , Rfl:  .  sitaGLIPtin (JANUVIA) 100 MG tablet, Take 100 mg by mouth daily., Disp: , Rfl:  .  triamcinolone (KENALOG) 0.025 % cream, APP EXT BID, Disp: , Rfl:  .  triamcinolone (KENALOG) 0.025 % ointment, Apply 1 application topically 2 (two) times daily., Disp: 30 g, Rfl: 0 .  triazolam  (HALCION) 0.125 MG tablet, TAKE 3 TABLETS BY MOUTH PRIOR TO DENTAL PROCEDURE AS DIRECTED BY DENTAL STAFF, Disp: , Rfl: 0   No Known Allergies   Objective:  Physical Examination:  Vascular Examination: Capillary refill time less than 3 seconds to 10 digits.  Dorsalis pedis and posterior tibial pulses palpable bilaterally.  Digital hair sparse x10 digits.  Skin temperature gradient within normal limits bilaterally.  Dermatological Examination: Pedal skin with normal turgor, texture and tone bilaterally.  Toenails 1 through 5 bilaterally are discolored, thick, dystrophic with subungual debris and pain with palpation to nailbeds.  There is no erythema, no edema, no drainage, no flocculence noted.  Musculoskeletal Examination: Muscle strength noted to be 5/5 to all lower extremity muscle groups bilaterally.  No corresponding deformities noted bilaterally.  No pain, crepitus or joint limitation noted with range of motion.    Neurological Examination: Sensation decreased with 10 g monofilament bilaterally.  Assessment: Mycotic nail infection with pain 1-5 b/l  Plan: 1. Toenails 1-5 b/l were debrided in length and girth without iatrogenic laceration. 2.  Continue soft, supportive shoe gear daily. 3.  Report any pedal injuries to medical professional. 4.  Follow up 3 months. 5.  Patient/POA to call should there be a question/concern in there interim.

## 2019-02-22 ENCOUNTER — Ambulatory Visit (INDEPENDENT_AMBULATORY_CARE_PROVIDER_SITE_OTHER): Payer: Medicare Other | Admitting: Podiatry

## 2019-02-22 ENCOUNTER — Encounter: Payer: Self-pay | Admitting: Podiatry

## 2019-02-22 ENCOUNTER — Other Ambulatory Visit: Payer: Self-pay

## 2019-02-22 DIAGNOSIS — M79674 Pain in right toe(s): Secondary | ICD-10-CM | POA: Diagnosis not present

## 2019-02-22 DIAGNOSIS — M79675 Pain in left toe(s): Secondary | ICD-10-CM | POA: Diagnosis not present

## 2019-02-22 DIAGNOSIS — B351 Tinea unguium: Secondary | ICD-10-CM | POA: Diagnosis not present

## 2019-02-22 NOTE — Patient Instructions (Signed)
Diabetes Mellitus and Foot Care Foot care is an important part of your health, especially when you have diabetes. Diabetes may cause you to have problems because of poor blood flow (circulation) to your feet and legs, which can cause your skin to:  Become thinner and drier.  Break more easily.  Heal more slowly.  Peel and crack. You may also have nerve damage (neuropathy) in your legs and feet, causing decreased feeling in them. This means that you may not notice minor injuries to your feet that could lead to more serious problems. Noticing and addressing any potential problems early is the best way to prevent future foot problems. How to care for your feet Foot hygiene  Wash your feet daily with warm water and mild soap. Do not use hot water. Then, pat your feet and the areas between your toes until they are completely dry. Do not soak your feet as this can dry your skin.  Trim your toenails straight across. Do not dig under them or around the cuticle. File the edges of your nails with an emery board or nail file.  Apply a moisturizing lotion or petroleum jelly to the skin on your feet and to dry, brittle toenails. Use lotion that does not contain alcohol and is unscented. Do not apply lotion between your toes. Shoes and socks  Wear clean socks or stockings every day. Make sure they are not too tight. Do not wear knee-high stockings since they may decrease blood flow to your legs.  Wear shoes that fit properly and have enough cushioning. Always look in your shoes before you put them on to be sure there are no objects inside.  To break in new shoes, wear them for just a few hours a day. This prevents injuries on your feet. Wounds, scrapes, corns, and calluses  Check your feet daily for blisters, cuts, bruises, sores, and redness. If you cannot see the bottom of your feet, use a mirror or ask someone for help.  Do not cut corns or calluses or try to remove them with medicine.  If you  find a minor scrape, cut, or break in the skin on your feet, keep it and the skin around it clean and dry. You may clean these areas with mild soap and water. Do not clean the area with peroxide, alcohol, or iodine.  If you have a wound, scrape, corn, or callus on your foot, look at it several times a day to make sure it is healing and not infected. Check for: ? Redness, swelling, or pain. ? Fluid or blood. ? Warmth. ? Pus or a bad smell. General instructions  Do not cross your legs. This may decrease blood flow to your feet.  Do not use heating pads or hot water bottles on your feet. They may burn your skin. If you have lost feeling in your feet or legs, you may not know this is happening until it is too late.  Protect your feet from hot and cold by wearing shoes, such as at the beach or on hot pavement.  Schedule a complete foot exam at least once a year (annually) or more often if you have foot problems. If you have foot problems, report any cuts, sores, or bruises to your health care provider immediately. Contact a health care provider if:  You have a medical condition that increases your risk of infection and you have any cuts, sores, or bruises on your feet.  You have an injury that is not   healing.  You have redness on your legs or feet.  You feel burning or tingling in your legs or feet.  You have pain or cramps in your legs and feet.  Your legs or feet are numb.  Your feet always feel cold.  You have pain around a toenail. Get help right away if:  You have a wound, scrape, corn, or callus on your foot and: ? You have pain, swelling, or redness that gets worse. ? You have fluid or blood coming from the wound, scrape, corn, or callus. ? Your wound, scrape, corn, or callus feels warm to the touch. ? You have pus or a bad smell coming from the wound, scrape, corn, or callus. ? You have a fever. ? You have a red line going up your leg. Summary  Check your feet every day  for cuts, sores, red spots, swelling, and blisters.  Moisturize feet and legs daily.  Wear shoes that fit properly and have enough cushioning.  If you have foot problems, report any cuts, sores, or bruises to your health care provider immediately.  Schedule a complete foot exam at least once a year (annually) or more often if you have foot problems. This information is not intended to replace advice given to you by your health care provider. Make sure you discuss any questions you have with your health care provider. Document Released: 05/15/2000 Document Revised: 06/30/2017 Document Reviewed: 06/19/2016 Elsevier Patient Education  2020 [employer]   Onychomycosis/Fungal Toenails  WHAT IS IT? An infection that lies within the keratin of your nail plate that is caused by a fungus.  WHY ME? Fungal infections affect all ages, sexes, races, and creeds.  There may be many factors that predispose you to a fungal infection such as age, coexisting medical conditions such as diabetes, or an autoimmune disease; stress, medications, fatigue, genetics, etc.  Bottom line: fungus thrives in a warm, moist environment and your shoes offer such a location.  IS IT CONTAGIOUS? Theoretically, yes.  You do not want to share shoes, nail clippers or files with someone who has fungal toenails.  Walking around barefoot in the same room or sleeping in the same bed is unlikely to transfer the organism.  It is important to realize, however, that fungus can spread easily from one nail to the next on the same foot.  HOW DO WE TREAT THIS?  There are several ways to treat this condition.  Treatment may depend on many factors such as age, medications, pregnancy, liver and kidney conditions, etc.  It is best to ask your doctor which options are available to you.  1. No treatment.   Unlike many other medical concerns, you can live with this condition.  However for many people this can be a painful condition and may lead to  ingrown toenails or a bacterial infection.  It is recommended that you keep the nails cut short to help reduce the amount of fungal nail. 2. Topical treatment.  These range from herbal remedies to prescription strength nail lacquers.  About 40-50% effective, topicals require twice daily application for approximately 9 to 12 months or until an entirely new nail has grown out.  The most effective topicals are medical grade medications available through physicians offices. 3. Oral antifungal medications.  With an 80-90% cure rate, the most common oral medication requires 3 to 4 months of therapy and stays in your system for a year as the new nail grows out.  Oral antifungal medications do require   blood work to make sure it is a safe drug for you.  A liver function panel will be performed prior to starting the medication and after the first month of treatment.  It is important to have the blood work performed to avoid any harmful side effects.  In general, this medication safe but blood work is required. 4. Laser Therapy.  This treatment is performed by applying a specialized laser to the affected nail plate.  This therapy is noninvasive, fast, and non-painful.  It is not covered by insurance and is therefore, out of pocket.  The results have been very good with a 80-95% cure rate.  The [facility] is the only practice in the area to offer this therapy. 5. Permanent Nail Avulsion.  Removing the entire nail so that a new nail will not grow back. 

## 2019-02-27 NOTE — Progress Notes (Signed)
Subjective: Eric Cummings is seen today for follow up for preventative diabetic foot care forpainful, elongated, thickened toenails 1-5 b/l feet that he cannot cut. Pain interferes with daily activities. Aggravating factor includes wearing enclosed shoe gear and relieved with periodic debridement.  Patient is accompanied by his mother who voices no new pedal concerns on today's visit.  Current Outpatient Medications on File Prior to Visit  Medication Sig  . amLODipine (NORVASC) 10 MG tablet Take 10 mg by mouth daily.  . BD INSULIN SYRINGE U/F 30G X 1/2" 0.5 ML MISC USE UNDER THE SKIN UP TO 3 TIMES A DAY  . Canagliflozin 100 MG TABS Take by mouth.  . fenofibrate 160 MG tablet Take 160 mg by mouth daily.  Marland Kitchen gabapentin (NEURONTIN) 100 MG capsule TK 1 C PO IN MORNING AND 3 CAPSULE HS  . glyBURIDE-metformin (GLUCOVANCE) 2.5-500 MG per tablet Take 1 tablet by mouth daily with breakfast.  . HUMALOG 100 UNIT/ML injection   . insulin glargine (LANTUS) 100 UNIT/ML injection Inject into the skin at bedtime.  . Loratadine (CLARITIN) 10 MG CAPS Take by mouth.  . metFORMIN (GLUCOPHAGE-XR) 500 MG 24 hr tablet TK 2 TS PO QAM AND 1 T QPM  . NONFORMULARY OR COMPOUNDED [place] compound:  Peripheral Neuropathy cream - Bupivacaine 1%, Doxepin 3%, Gabapentin 6%, Pentoxifylline 3%, Topiramate 1%, dispense 120 grams, apply 1-2 grams to affected area 3-4 times daily, +2refills.  Marland Kitchen olmesartan (BENICAR) 20 MG tablet Take 20 mg by mouth daily.  Marland Kitchen omeprazole (PRILOSEC) 20 MG capsule Take 20 mg by mouth daily.  . sitaGLIPtin (JANUVIA) 100 MG tablet Take 100 mg by mouth daily.  Marland Kitchen triamcinolone (KENALOG) 0.025 % cream APP EXT BID  . triamcinolone (KENALOG) 0.025 % ointment Apply 1 application topically 2 (two) times daily.  . triazolam (HALCION) 0.125 MG tablet TAKE 3 TABLETS BY MOUTH PRIOR TO DENTAL PROCEDURE AS DIRECTED BY DENTAL STAFF   No current facility-administered medications on file prior to  visit.      No Known Allergies   Objective:  Vascular Examination: Capillary refill time <3 secs x 10 digits.  Dorsalis pedis present b/l.  Posterior tibial pulses present b/l.  Digital hair sparse x 10 digits.  Skin temperature gradient WNL b/l.   Dermatological Examination: Skin with normal turgor, texture and tone b/l.  Toenails 1-5 b/l discolored, thick, dystrophic with subungual debris and pain with palpation to nailbeds due to thickness of nails.  Musculoskeletal: Muscle strength 5/5 to all LE muscle groups  No gross bony deformities b/l.  No pain, crepitus or joint limitation noted with ROM.   Neurological Examination: Protective sensation decreasd with 10 gram monofilament bilaterally.  Assessment: Painful onychomycosis toenails 1-5 b/l   Plan: 1. Toenails 1-5 b/l were debrided in length and girth without iatrogenic bleeding. 2. Patient to continue soft, supportive shoe gear 3. Patient to report any pedal injuries to medical professional immediately. 4. Follow up 3 months.  5. Patient/POA to call should there be a concern in the interim.

## 2019-05-10 ENCOUNTER — Other Ambulatory Visit: Payer: Self-pay

## 2019-05-10 DIAGNOSIS — Z20822 Contact with and (suspected) exposure to covid-19: Secondary | ICD-10-CM

## 2019-05-11 LAB — NOVEL CORONAVIRUS, NAA: SARS-CoV-2, NAA: NOT DETECTED

## 2019-05-24 ENCOUNTER — Other Ambulatory Visit: Payer: Self-pay

## 2019-05-24 ENCOUNTER — Encounter: Payer: Self-pay | Admitting: Podiatry

## 2019-05-24 ENCOUNTER — Ambulatory Visit (INDEPENDENT_AMBULATORY_CARE_PROVIDER_SITE_OTHER): Payer: Medicare Other | Admitting: Podiatry

## 2019-05-24 DIAGNOSIS — M79674 Pain in right toe(s): Secondary | ICD-10-CM

## 2019-05-24 DIAGNOSIS — B351 Tinea unguium: Secondary | ICD-10-CM | POA: Diagnosis not present

## 2019-05-24 DIAGNOSIS — M79675 Pain in left toe(s): Secondary | ICD-10-CM

## 2019-05-24 NOTE — Patient Instructions (Signed)

## 2019-05-29 NOTE — Progress Notes (Signed)
Subjective:  Eric Cummings presents to clinic today, accompanied by his mother, for painful,  discolored, elongated toenails  of both feet that become tender and patient cannot cut because of thickness. Pain is aggravated when wearing enclosed shoe gear and relieved with periodic professional debridement.  Patient's mother is present during the visit. She voices no new pedal concerns on today's visit.  Medications reviewed in chart.  No Known Allergies   Objective:  Physical Examination:  Vascular Examination: Capillary refill time to digits <3 seconds b/l.  Palpable DP/PT pulses b/l.  Digital hair absent b/l.   No edema noted b/l.  Skin temperature gradient WNL b/l.  Dermatological Examination: Skin with normal turgor, texture and tone b/l.  No open wounds b/l.  No interdigital macerations noted b/l.  Elongated, thick, discolored brittle toenails with subungual debris and pain on dorsal palpation of nailbeds 1-5 b/l.  Musculoskeletal Examination: Muscle strength 5/5 to all muscle groups b/l.  No pain, crepitus or joint discomfort with active/passive ROM.  Neurological Examination: Sensation diminished b/l with 10 gram monofilament  Assessment: Mycotic nail infection with pain 1-5 b/l  Plan: 1. Toenails 1-5 b/l were debrided in length and girth without iatrogenic laceration. 2.  Continue soft, supportive shoe gear daily. 3.  Report any pedal injuries to medical professional. 4.  Follow up 3 months. 5.  Patient/POA to call should there be a question/concern in there interim.

## 2019-08-25 ENCOUNTER — Ambulatory Visit: Payer: Medicare Other | Admitting: Podiatry

## 2020-03-30 ENCOUNTER — Ambulatory Visit: Payer: Self-pay

## 2020-04-08 ENCOUNTER — Encounter: Payer: Self-pay | Admitting: Podiatry

## 2020-04-08 ENCOUNTER — Ambulatory Visit (INDEPENDENT_AMBULATORY_CARE_PROVIDER_SITE_OTHER): Payer: Medicare Other | Admitting: Podiatry

## 2020-04-08 ENCOUNTER — Other Ambulatory Visit: Payer: Self-pay

## 2020-04-08 DIAGNOSIS — M79674 Pain in right toe(s): Secondary | ICD-10-CM | POA: Diagnosis not present

## 2020-04-08 DIAGNOSIS — B351 Tinea unguium: Secondary | ICD-10-CM

## 2020-04-08 DIAGNOSIS — E1140 Type 2 diabetes mellitus with diabetic neuropathy, unspecified: Secondary | ICD-10-CM | POA: Diagnosis not present

## 2020-04-08 DIAGNOSIS — M79675 Pain in left toe(s): Secondary | ICD-10-CM

## 2020-04-13 ENCOUNTER — Ambulatory Visit: Payer: Medicare Other | Attending: Internal Medicine

## 2020-04-13 DIAGNOSIS — Z23 Encounter for immunization: Secondary | ICD-10-CM

## 2020-04-13 NOTE — Progress Notes (Addendum)
Subjective:  Eric Cummings presents to clinic for diabetic foot care today, accompanied by his mother, for painful,  discolored, elongated toenails  of both feet that become tender and patient cannot cut because of thickness. Pain is aggravated when wearing enclosed shoe gear and relieved with periodic professional debridement.  Patient's mother is present during the visit. She states Eric Cummings continues to wash himself multiple times per day contributing to his dry feet. She's had the water temperature turned down in the home because of this.  Medications reviewed in chart.  No Known Allergies   Objective:  Physical Examination:  Vascular Examination: Capillary refill time to digits <3 seconds b/l.  Palpable DP/PT pulses b/l.  Digital hair absent b/l.   No edema noted b/l.  Skin temperature gradient WNL b/l.  Dermatological Examination: Skin with normal turgor, texture and tone b/l.  No open wounds b/l.  No interdigital macerations noted b/l.  Elongated, thick, discolored brittle toenails with subungual debris and pain on dorsal palpation of nailbeds 1-5 b/l.  Musculoskeletal Examination: Muscle strength 5/5 to all muscle groups b/l.  Pes planus foot deformity b/l LE.  No pain, crepitus or joint discomfort with active/passive ROM.  Neurological Examination: Sensation diminished b/l with 10 gram monofilament.  Assessment: Mycotic nail infection with pain 1-5 b/l Pes planus foot deformity b/l NIDDM  Plan: 1. Toenails 1-5 b/l were debrided in length and girth without iatrogenic laceration. 2. Schedule for diabetic shoes. 3. Continue diabetic foot care.  4. Continue soft, supportive shoe gear daily. 4.   Report any pedal injuries to medical professional. 5.   Follow up 3 months. 6.   Patient/POA to call should there be a question/concern in there interim.

## 2020-04-13 NOTE — Progress Notes (Signed)
   Covid-19 Vaccination Clinic  Name:  Eric Cummings    MRN: [id number] DOB: August 27, 1965  04/13/2020  Eric Cummings was observed post Covid-19 immunization for 15 minutes without incident. He was provided with Vaccine Information Sheet and instruction to access the V-Safe system.   Eric Cummings was instructed to call 911 with any severe reactions post vaccine: Marland Kitchen Difficulty breathing  . Swelling of face and throat  . A fast heartbeat  . A bad rash all over body  . Dizziness and weakness

## 2020-06-17 ENCOUNTER — Ambulatory Visit: Payer: Medicare Other | Admitting: Orthotics

## 2020-07-01 ENCOUNTER — Other Ambulatory Visit: Payer: Self-pay

## 2020-07-01 ENCOUNTER — Ambulatory Visit: Payer: Medicare Other | Admitting: Orthotics

## 2020-07-01 DIAGNOSIS — E1140 Type 2 diabetes mellitus with diabetic neuropathy, unspecified: Secondary | ICD-10-CM

## 2020-07-19 ENCOUNTER — Ambulatory Visit (INDEPENDENT_AMBULATORY_CARE_PROVIDER_SITE_OTHER): Payer: Medicare Other | Admitting: Podiatry

## 2020-07-19 ENCOUNTER — Encounter: Payer: Self-pay | Admitting: Podiatry

## 2020-07-19 ENCOUNTER — Other Ambulatory Visit: Payer: Self-pay

## 2020-07-19 DIAGNOSIS — M2141 Flat foot [pes planus] (acquired), right foot: Secondary | ICD-10-CM

## 2020-07-19 DIAGNOSIS — B351 Tinea unguium: Secondary | ICD-10-CM

## 2020-07-19 DIAGNOSIS — M79674 Pain in right toe(s): Secondary | ICD-10-CM

## 2020-07-19 DIAGNOSIS — M2142 Flat foot [pes planus] (acquired), left foot: Secondary | ICD-10-CM

## 2020-07-19 DIAGNOSIS — E1140 Type 2 diabetes mellitus with diabetic neuropathy, unspecified: Secondary | ICD-10-CM | POA: Diagnosis not present

## 2020-07-19 DIAGNOSIS — M79675 Pain in left toe(s): Secondary | ICD-10-CM | POA: Diagnosis not present

## 2020-07-25 NOTE — Progress Notes (Signed)
Subjective:  Eric Cummings presents to clinic for diabetic foot care today, accompanied by his mother, for painful,  discolored, elongated toenails  of both feet that become tender and patient cannot cut because of thickness. Pain is aggravated when wearing enclosed shoe gear and relieved with periodic professional debridement.  Patient's mother is present during the visit. She states they are still awaiting delivery of Mr. Kiang' diabetic shoes.She also states he continues to have pan in feet at night and would like to know treatment options available.  No Known Allergies   Objective:  Physical Examination:  Vascular Examination: Capillary refill time to digits <3 seconds b/l. Palpable DP/PT pulses b/l. Digital hair absent b/l. No edema noted b/l. Skin temperature gradient WNL b/l.  Dermatological Examination: Skin with normal turgor, texture and tone b/l. No open wounds b/l. No interdigital macerations noted b/l. Elongated, thick, discolored brittle toenails with subungual debris and pain on dorsal palpation of nailbeds 1-5 b/l.  Musculoskeletal Examination: Muscle strength 5/5 to all muscle groups b/l. Pes planus foot deformity b/l LE. No pain, crepitus or joint discomfort with active/passive ROM.  Neurological Examination: Sensation diminished b/l with 10 gram monofilament.  Assessment: Mycotic nail infection with pain 1-5 b/l Pes planus foot deformity b/l NIDDM  Plan: 1. Toenails 1-5 b/l were debrided in length and girth without iatrogenic laceration. 2. Still awaiting delivery of diabetic shoes. 3. For neuropathy symptoms, advised his mother to apply Aspercreme to feet before bedtime. 4. Continue diabetic foot care.  5. Continue soft, supportive shoe gear daily. 4.   Report any pedal injuries to medical professional. 5.   Follow up 3 months. 6.   Patient/POA to call should there be a question/concern in there interim.

## 2020-10-30 ENCOUNTER — Other Ambulatory Visit: Payer: Self-pay

## 2020-10-30 ENCOUNTER — Ambulatory Visit (INDEPENDENT_AMBULATORY_CARE_PROVIDER_SITE_OTHER): Payer: Medicare Other | Admitting: Podiatry

## 2020-10-30 DIAGNOSIS — M79675 Pain in left toe(s): Secondary | ICD-10-CM

## 2020-10-30 DIAGNOSIS — B351 Tinea unguium: Secondary | ICD-10-CM | POA: Diagnosis not present

## 2020-10-30 DIAGNOSIS — E1140 Type 2 diabetes mellitus with diabetic neuropathy, unspecified: Secondary | ICD-10-CM

## 2020-10-30 DIAGNOSIS — M79674 Pain in right toe(s): Secondary | ICD-10-CM

## 2020-11-08 ENCOUNTER — Encounter: Payer: Self-pay | Admitting: Podiatry

## 2020-11-08 NOTE — Progress Notes (Signed)
  Subjective:  Patient ID: Eric Cummings, male    DOB: 30-Jan-1966,  MRN: [id number]  55 y.o. male presents with at risk foot care with history of diabetic neuropathy and thick, elongated toenails b/l feet which are tender when wearing enclosed shoe gear.  Patient's medical history is obtained from his mother who is present today and is his primary caretaker. Mother states Eric Cummings continues to get in hot shower at least three times daily as this provides him some type of sensory relief.  Patient's blood sugar was 127 mg/dl today.  Patient does not monitor blood glucose on a daily basis.  Patient did not check blood glucose this morning.  PCP: [name], MD and last visit was:   Review of Systems: Negative except as noted in the HPI.   No Known Allergies  Objective:  There were no vitals filed for this visit. Constitutional Patient is a pleasant 55 y.o. African American male WD, WN in NAD. AAO x 3.  Vascular Capillary fill time to digits <3 seconds b/l lower extremities. Palpable pedal pulses b/l LE. Pedal hair absent. Lower extremity skin temperature gradient within normal limits. No cyanosis or clubbing noted.  Neurologic Normal speech. Pt has subjective symptoms of neuropathy per mother. Patient unable to follow commands of LE neurological examination due to cognitive deficits. Patient does respond to external noxious stimuli.  Dermatologic Pedal skin with normal turgor, texture and tone bilaterally. No open wounds bilaterally. No interdigital macerations bilaterally. Toenails 1-5 b/l elongated, discolored, dystrophic, thickened, crumbly with subungual debris and tenderness to dorsal palpation.  Orthopedic: Normal muscle strength 5/5 to all lower extremity muscle groups bilaterally. No pain crepitus or joint limitation noted with ROM b/l. Pes planus deformity noted b/l.    Assessment:   1. Pain due to onychomycosis of toenails of both feet   2. Type 2 diabetes, controlled,  with neuropathy (HCC)    Plan:  Patient was evaluated and treated and all questions answered.  Onychomycosis with pain -Nails palliatively debridement as below. -Educated on self-care  Procedure: Nail Debridement Rationale: Pain Type of Debridement: manual, sharp debridement. Instrumentation: Nail nipper, rotary burr. Number of Nails: 10  -Examined patient. -Patient to continue soft, supportive shoe gear daily. -Toenails 1-5 b/l were debrided in length and girth with sterile nail nippers and dremel without iatrogenic bleeding.  -Patient to report any pedal injuries to medical professional immediately. -Patient awaiting new diabetic shoes. We are awaiting signed PCP diabetic shoe certification form. -Patient/POA to call should there be question/concern in the interim.  Return in about 3 months (around 01/30/2021).  [name], DPM

## 2020-12-26 ENCOUNTER — Telehealth: Payer: Self-pay | Admitting: Podiatry

## 2020-12-26 NOTE — Telephone Encounter (Signed)
Diabetic shoes/inserts in... lvm for pt to call to schedule an appt to pick them up before 8.8 as authorization does expire.Marland KitchenMarland Kitchen

## 2021-01-01 ENCOUNTER — Other Ambulatory Visit: Payer: Self-pay

## 2021-01-01 ENCOUNTER — Ambulatory Visit (INDEPENDENT_AMBULATORY_CARE_PROVIDER_SITE_OTHER): Payer: Medicare Other | Admitting: Podiatry

## 2021-01-01 DIAGNOSIS — M2141 Flat foot [pes planus] (acquired), right foot: Secondary | ICD-10-CM

## 2021-01-01 DIAGNOSIS — E1140 Type 2 diabetes mellitus with diabetic neuropathy, unspecified: Secondary | ICD-10-CM

## 2021-01-01 DIAGNOSIS — M2142 Flat foot [pes planus] (acquired), left foot: Secondary | ICD-10-CM

## 2021-01-01 NOTE — Progress Notes (Signed)
The patient presented to the office to day to pick up diabetic shoes and 3 pair diabetic custom inserts.  1 pair of inserts were put in the shoes and the shoes were fitted to the patient. The patient states they are comfortable and free of defect. He was satisfied with the fit of the shoe. Instructions for break in and wear were dispensed. The patient signed the delivery documentation and break in instruction form.  If any concerns or questions arise, he is instructed to call 

## 2021-01-27 ENCOUNTER — Ambulatory Visit (INDEPENDENT_AMBULATORY_CARE_PROVIDER_SITE_OTHER): Payer: Medicare Other | Admitting: Podiatry

## 2021-01-27 ENCOUNTER — Other Ambulatory Visit: Payer: Self-pay

## 2021-01-27 ENCOUNTER — Encounter: Payer: Self-pay | Admitting: Podiatry

## 2021-01-27 DIAGNOSIS — E1140 Type 2 diabetes mellitus with diabetic neuropathy, unspecified: Secondary | ICD-10-CM | POA: Diagnosis not present

## 2021-01-27 DIAGNOSIS — M79674 Pain in right toe(s): Secondary | ICD-10-CM

## 2021-01-27 DIAGNOSIS — B351 Tinea unguium: Secondary | ICD-10-CM

## 2021-01-27 DIAGNOSIS — M79675 Pain in left toe(s): Secondary | ICD-10-CM | POA: Diagnosis not present

## 2021-01-30 NOTE — Progress Notes (Signed)
  Subjective:  Patient ID: Eric Cummings, male    DOB: 1965-07-31,  MRN: [id number]  55 y.o. autistic male presents at risk foot care with history of diabetic neuropathy and painful thick toenails that are difficult to trim. Pain interferes with ambulation. Aggravating factors include wearing enclosed shoe gear. Pain is relieved with periodic professional debridement.  Patient states blood glucose was 89 mg/dl today. He is accompanied by his mother on today's visit. She is his primary caretaker.  PCP is [name], MD , and last visit was 07/19/2020.  Allergies  Allergen Reactions   Ace Inhibitors     Other reaction(s): cough   Iodine     Other reaction(s): Unknown   Shellfish-Derived Products     Other reaction(s): Unknown   Simvastatin     Other reaction(s): intolerance to    Review of Systems: Negative except as noted in the HPI.   Objective:  Vascular Examination: Vascular status intact b/l with palpable pedal pulses. CFT <3 seconds b/l. Digital hair absent. No edema. No pain with calf compression b/l. Skin temperature gradient WNL b/l.   Neurological Examination: Sensation grossly intact b/l with 10 gram monofilament. Vibratory sensation intact b/l. He has subjective symptoms of neuroapthy.  Dermatological Examination: Pedal skin with normal turgor, texture and tone b/l. Toenails 1-5 b/l thick, discolored, elongated with subungual debris and pain on dorsal palpation. No hyperkeratotic lesions noted b/l.   Musculoskeletal Examination: Muscle strength 5/5 to b/l LE. Pes planus foot deformity b/l.  Radiographs: None Assessment:   1. Pain due to onychomycosis of toenails of both feet   2. Type 2 diabetes, controlled, with neuropathy (HCC)     Plan:  -No new findings. No new orders. -Continue diabetic foot care principles: inspect feet daily, monitor glucose as recommended by PCP and/or Endocrinologist, and follow prescribed diet per PCP, Endocrinologist and/or  dietician. -Patient to continue soft, supportive shoe gear daily. -Toenails 1-5 b/l were debrided in length and girth with sterile nail nippers and dremel without iatrogenic bleeding.  -Patient to report any pedal injuries to medical professional immediately. -Patient/POA to call should there be question/concern in the interim.  Return in about 3 months (around 04/29/2021).  [name], DPM

## 2021-05-07 ENCOUNTER — Ambulatory Visit: Payer: Medicare Other | Admitting: Podiatry

## 2021-05-09 ENCOUNTER — Other Ambulatory Visit: Payer: Self-pay

## 2021-05-09 ENCOUNTER — Ambulatory Visit (INDEPENDENT_AMBULATORY_CARE_PROVIDER_SITE_OTHER): Payer: Medicare Other | Admitting: Podiatry

## 2021-05-09 DIAGNOSIS — M79675 Pain in left toe(s): Secondary | ICD-10-CM | POA: Diagnosis not present

## 2021-05-09 DIAGNOSIS — E1142 Type 2 diabetes mellitus with diabetic polyneuropathy: Secondary | ICD-10-CM

## 2021-05-09 DIAGNOSIS — Z1389 Encounter for screening for other disorder: Secondary | ICD-10-CM

## 2021-05-09 DIAGNOSIS — L601 Onycholysis: Secondary | ICD-10-CM | POA: Diagnosis not present

## 2021-05-09 DIAGNOSIS — B351 Tinea unguium: Secondary | ICD-10-CM | POA: Diagnosis not present

## 2021-05-09 DIAGNOSIS — M2142 Flat foot [pes planus] (acquired), left foot: Secondary | ICD-10-CM

## 2021-05-09 DIAGNOSIS — M2141 Flat foot [pes planus] (acquired), right foot: Secondary | ICD-10-CM | POA: Diagnosis not present

## 2021-05-09 DIAGNOSIS — M79674 Pain in right toe(s): Secondary | ICD-10-CM | POA: Diagnosis not present

## 2021-05-15 ENCOUNTER — Encounter: Payer: Self-pay | Admitting: Podiatry

## 2021-05-15 DIAGNOSIS — R351 Nocturia: Secondary | ICD-10-CM | POA: Insufficient documentation

## 2021-05-15 NOTE — Progress Notes (Signed)
ANNUAL DIABETIC FOOT EXAM  Subjective: Eric Cummings presents today for for annual diabetic foot examination. He presents with his mother, who is his primary caregiver. She relates his medical history on today. Eric Cummings is mostly nonverbal.  Patient mother relates he has 35 year h/o diabetes.  Mother denies any h/o foot wounds.  Patient has been diagnosed with neuropathy and it is managed with gabapentin.  Patient's blood sugar was 83 mg/dl today.   Eric Corn, MD is patient's PCP. Last visit was 11/22/2020.  Past Medical History:  Diagnosis Date   Diabetes mellitus without complication [facility])    Patient Active Problem List   Diagnosis Date Noted   Nocturia 05/15/2021   Encounter for screening for other disorder 04/04/2018   Diabetic peripheral neuropathy associated with type 2 diabetes mellitus (HCC) 01/08/2016   Diabetic renal disease (HCC) 01/08/2016   Adult failure to thrive syndrome 08/16/2015   Constipation 05/17/2015   Polyneuropathy 05/17/2015   Tinea unguium 05/17/2015   Cardiac arrhythmia 01/25/2015   Long term (current) use of insulin (HCC) 01/25/2015   Abnormal gait 09/14/2014   Type 1 diabetes mellitus without complications (HCC) 09/14/2014   Cough 11/30/2013   Syncope and collapse 08/21/2013   Impacted cerumen 07/29/2012   Underimmunization status 03/01/2012   Otalgia 11/27/2011   Gastro-esophageal reflux disease without esophagitis 06/20/2010   Allergic rhinitis 02/28/2010   Dental caries 06/07/2009   Hyperglycemia due to type 2 diabetes mellitus (HCC) 06/07/2009   Autistic disorder 05/28/2009   Dermatitis 05/28/2009   Essential hypertension 05/28/2009   Hyperlipidemia 05/28/2009   Obesity 05/28/2009   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 No pertinent surgical history. Current Outpatient Medications on File Prior to Visit  Medication Sig Dispense Refill   ACCU-CHEK AVIVA PLUS test strip 1 each 3 (three) times  daily.     Accu-Chek FastClix Lancets MISC Apply topically.     amLODipine (NORVASC) 10 MG tablet Take 10 mg by mouth daily.     B-D ULTRAFINE III SHORT PEN 31G X 8 MM MISC SMARTSIG:SUB-Q 1-3 Times Daily     Blood Glucose Calibration (ACCU-CHEK AVIVA) SOLN use to calibrate meter Dx:  E11.65     Canagliflozin 100 MG TABS Take by mouth.     empagliflozin (JARDIANCE) 10 MG TABS tablet Take 1 tablet by mouth daily.     EPINEPHrine (EPIPEN 2-PAK) 0.3 mg/0.3 mL IJ SOAJ injection use as directed for anaphylaxis prn     fenofibrate 160 MG tablet Take 160 mg by mouth daily.     fenofibrate 160 MG tablet Take 1 tablet by mouth daily.     gabapentin (NEURONTIN) 100 MG capsule TK 1 C PO IN MORNING AND 3 CAPSULE HS     glyBURIDE-metformin (GLUCOVANCE) 2.5-500 MG per tablet Take 1 tablet by mouth daily with breakfast.     HUMALOG 100 UNIT/ML injection Inject into the skin.     insulin glargine (LANTUS) 100 UNIT/ML injection Inject into the skin at bedtime.     insulin lispro (HUMALOG) 100 UNIT/ML injection INJECT UNDER SKIN 0 UNIT(0-150), 3 UNITS(151-200), 5 UNITS(201-300) AND 7 UNITS IF GREATER THAN 301.     Insulin Syringe-Needle U-100 (INSULIN SYRINGE .5CC/31GX5/16") 31G X 5/16" 0.5 ML MISC USE UNDER THE SKIN UP TO THREE TIMES DAILY.     Loratadine (CLARITIN) 10 MG CAPS Take by mouth.     metFORMIN (GLUCOPHAGE-XR) 500 MG 24 hr tablet TK 2 TS PO QAM AND 1 T QPM  NONFORMULARY OR COMPOUNDED ITEM [facility] compound:  Peripheral Neuropathy cream - Bupivacaine 1%, Doxepin 3%, Gabapentin 6%, Pentoxifylline 3%, Topiramate 1%, dispense 120 grams, apply 1-2 grams to affected area 3-4 times daily, +2refills. 120 each 2   olmesartan (BENICAR) 20 MG tablet Take 20 mg by mouth daily.     olmesartan (BENICAR) 20 MG tablet Take 1 tablet by mouth daily.     omeprazole (PRILOSEC) 20 MG capsule Take 20 mg by mouth daily.     sitaGLIPtin (JANUVIA) 100 MG tablet Take 1 tablet by mouth daily.     triamcinolone  (KENALOG) 0.025 % cream APP EXT BID     triamcinolone (KENALOG) 0.025 % ointment Apply 1 application topically 2 (two) times daily. 30 g 0   triazolam (HALCION) 0.125 MG tablet TAKE 3 TABLETS BY MOUTH PRIOR TO DENTAL PROCEDURE AS DIRECTED BY DENTAL STAFF  0   No current facility-administered medications on file prior to visit.    Allergies  Allergen Reactions   Ace Inhibitors     Other reaction(s): cough Other reaction(s): cough   Iodine     Other reaction(s): Unknown Other reaction(s): Unknown   Shellfish-Derived Products     Other reaction(s): Unknown Other reaction(s): Unknown   Simvastatin     Other reaction(s): intolerance to Other reaction(s): intolerance to   Social History   Occupational History   Not on file  Tobacco Use   Smoking status: Never   Smokeless tobacco: Never  Substance and Sexual Activity   Alcohol use: No   Drug use: No   Sexual activity: Not on file   History reviewed. No pertinent family history. Immunization History  Administered Date(s) Administered   PFIZER(Purple Top)SARS-COV-2 Vaccination 04/13/2020   Tdap 08/23/2017     Review of Systems: Negative except as noted in the HPI.   Objective: There were no vitals filed for this visit.  Eric Cummings is a pleasant 55 y.o. male in NAD. AAO X 3.  Vascular Examination: CFT <3 seconds b/l LE. Palpable DP/PT pulses b/l LE. Digital hair absent b/l. Skin temperature gradient WNL b/l. No pain with calf compression b/l. No edema noted b/l. No cyanosis or clubbing noted b/l LE.  Dermatological Examination: Pedal skin is warm and supple b/l LE. No open wounds b/l LE. No interdigital macerations noted b/l LE. Toenails 1-5 b/l elongated, discolored, dystrophic, thickened, crumbly with subungual debris and tenderness to dorsal palpation. There is noted onchyolysis of entire nailplate of bilateral great toes.  The nailbeds remain intact. There is no erythema, no edema, no drainage, no underlying  fluctuance.  Musculoskeletal Examination: Normal muscle strength 5/5 to all lower extremity muscle groups bilaterally. Pes planus deformity noted bilateral LE.Marland Kitchen No pain, crepitus or joint limitation noted with ROM b/l LE.  Patient ambulates independently without assistive aids.  Footwear Assessment: Does the patient wear appropriate shoes? Yes. Does the patient need inserts/orthotics? No.  Neurological Examination: Protective sensation intact 5/5 intact bilaterally with 10g monofilament b/l. Vibratory sensation intact b/l.  Assessment: 1. Pain due to onychomycosis of toenails of both feet   2. Onycholysis of toenail   3. Pes planus of both feet   4. Diabetic peripheral neuropathy associated with type 2 diabetes mellitus (HCC)   5. Encounter for screening for other disorder     ADA FOOT RISK CATEGORIZATION Low Risk :  Patient has all of the following: Intact protective sensation No prior foot ulcer  No severe deformity Pedal pulses present  Plan: -Examined patient. -Diabetic foot  examination performed today. -Continue foot and shoe inspections daily. Monitor blood glucose per PCP/Endocrinologist's recommendations. -Mycotic toenails 2-5 bilaterally were debrided in length and girth with sterile nail nippers and dremel without iatrogenic bleeding. -Loose nailplate bilateral great toes gently debrided to level of adherence. Digit cleansed with alcohol. triple antibiotic ointment applied to nailbed followed by light dressing. No further treatment required by patient. -Patient/POA to call should there be question/concern in the interim.  Return in about 3 months (around 08/07/2021).  [name], DPM

## 2021-08-13 ENCOUNTER — Ambulatory Visit: Payer: Medicare Other | Admitting: Podiatry

## 2021-09-29 ENCOUNTER — Encounter: Payer: Self-pay | Admitting: Podiatry

## 2021-09-29 ENCOUNTER — Ambulatory Visit (INDEPENDENT_AMBULATORY_CARE_PROVIDER_SITE_OTHER): Payer: Medicare Other | Admitting: Podiatry

## 2021-09-29 DIAGNOSIS — M79674 Pain in right toe(s): Secondary | ICD-10-CM | POA: Diagnosis not present

## 2021-09-29 DIAGNOSIS — E1142 Type 2 diabetes mellitus with diabetic polyneuropathy: Secondary | ICD-10-CM | POA: Diagnosis not present

## 2021-09-29 DIAGNOSIS — M79675 Pain in left toe(s): Secondary | ICD-10-CM

## 2021-09-29 DIAGNOSIS — B351 Tinea unguium: Secondary | ICD-10-CM | POA: Diagnosis not present

## 2021-10-07 DIAGNOSIS — R32 Unspecified urinary incontinence: Secondary | ICD-10-CM | POA: Insufficient documentation

## 2021-10-07 NOTE — Progress Notes (Signed)
?  Subjective:  ?Patient ID: Eric Cummings, male    DOB: Feb 21, 1966,  MRN: [id number] ? ?Eric Cummings presents to clinic today for at risk foot care with history of diabetic neuropathy and painful elongated mycotic toenails 1-5 bilaterally which are tender when wearing enclosed shoe gear. Pain is relieved with periodic professional debridement. ? ?Patient is nonverbally autistic and his mother is his primary caregiver/POA. ? ?Patient's mother states blood glucose was 124 mg/dl today.  Last known HgA1c was 8.1%. ? ?New problem(s): None.  ? ?PCP is [name], MD , and last visit was August 13, 2021. ? ?Allergies  ?Allergen Reactions  ? Ace Inhibitors   ?  Other reaction(s): cough ?Other reaction(s): cough  ? Iodine   ?  Other reaction(s): Unknown ?Other reaction(s): Unknown  ? Shellfish-Derived Products   ?  Other reaction(s): Unknown ?Other reaction(s): Unknown  ? Simvastatin   ?  Other reaction(s): intolerance to ?Other reaction(s): intolerance to  ? ? ?Review of Systems: Negative except as noted in the HPI. ? ?Objective:  ? ?There were no vitals filed for this visit. ? ?Eric Cummings is a pleasant 56 y.o. male in NAD. AAO X 3. ? ?Vascular Examination: ?CFT <3 seconds b/l LE. Palpable DP/PT pulses b/l LE. Digital hair absent b/l. Skin temperature gradient WNL b/l. No pain with calf compression b/l. No edema noted b/l. No cyanosis or clubbing noted b/l LE. ? ?Dermatological Examination: ?Pedal skin is warm and supple b/l LE. No open wounds b/l LE. No interdigital macerations noted b/l LE. Toenails 1-5 b/l elongated, discolored, dystrophic, thickened, crumbly with subungual debris and tenderness to dorsal palpation.  ? ?Musculoskeletal Examination: ?Normal muscle strength 5/5 to all lower extremity muscle groups bilaterally. Pes planus deformity noted bilateral LE.Marland Kitchen No pain, crepitus or joint limitation noted with ROM b/l LE.  Patient ambulates independently without assistive aids. ? ?Neurological  Examination: ?Pt has subjective symptoms of neuropathy. Protective sensation intact 5/5 intact bilaterally with 10g monofilament b/l. Vibratory sensation intact b/l. ? ?Assessment/Plan: ?1. Pain due to onychomycosis of toenails of both feet   ?2. Diabetic peripheral neuropathy associated with type 2 diabetes mellitus (HCC)   ? ?-Patient was evaluated and treated. All patient's and/or POA's questions/concerns answered on today's visit. ?-No new findings. No new orders. ?-Continue diabetic foot care principles: inspect feet daily, monitor glucose as recommended by PCP and/or Endocrinologist, and follow prescribed diet per PCP, Endocrinologist and/or dietician. ?-Toenails 1-5 b/l were debrided in length and girth with sterile nail nippers and dremel without iatrogenic bleeding.  ?-Patient/POA to call should there be question/concern in the interim.  ? ?Return in about 3 months (around 12/30/2021). ? ?[name], DPM  ?

## 2021-10-13 ENCOUNTER — Other Ambulatory Visit: Payer: Self-pay | Admitting: Podiatry

## 2021-10-13 ENCOUNTER — Ambulatory Visit (INDEPENDENT_AMBULATORY_CARE_PROVIDER_SITE_OTHER): Payer: Medicare Other | Admitting: Podiatry

## 2021-10-13 ENCOUNTER — Telehealth: Payer: Self-pay | Admitting: *Deleted

## 2021-10-13 ENCOUNTER — Ambulatory Visit: Payer: Medicare Other

## 2021-10-13 ENCOUNTER — Ambulatory Visit (INDEPENDENT_AMBULATORY_CARE_PROVIDER_SITE_OTHER): Payer: Medicare Other

## 2021-10-13 DIAGNOSIS — S90852A Superficial foreign body, left foot, initial encounter: Secondary | ICD-10-CM

## 2021-10-13 MED ORDER — DOXYCYCLINE HYCLATE 100 MG PO CAPS: 100.0000 mg | ORAL_CAPSULE | Freq: Two times a day (BID) | ORAL | 0 refills | Status: AC

## 2021-10-13 MED ORDER — MUPIROCIN 2 % EX OINT: TOPICAL_OINTMENT | CUTANEOUS | 1 refills | Status: DC

## 2021-10-13 NOTE — Patient Instructions (Signed)
DRESSING CHANGES LEFT FOOT:  ? ?PHARMACY SHOPPING LIST: ?Saline or Wound Cleanser for cleaning wound ?2 x 2 inch sterile gauze for cleaning wound ?Mupirocin Ointment ? ?A. IF DISPENSED, WEAR SURGICAL SHOE OR WALKING BOOT AT ALL TIMES. ? ?B. IF PRESCRIBED ORAL ANTIBIOTICS, TAKE ALL MEDICATION AS PRESCRIBED UNTIL ALL ARE GONE. ? ?C. IF DOCTOR HAS DESIGNATED NONWEIGHTBEARING STATUS, PLEASE ADHERE TO INSTRUCTIONS. ? ?1. KEEP left foot DRY AT ALL TIMES!!!! ? ?2. CLEANSE WOUND WITH SALINE OR WOUND CLEANSER. ? ?3. DAB DRY WITH GAUZE SPONGE. ? ?4. APPLY A LIGHT AMOUNT OF Mupirocin Ointment TO LEFT FOOT WOUND ? ?5. APPLY BAND-AID ? ?7. DO NOT WALK BAREFOOT!!! ? ?IF YOU EXPERIENCE ANY FEVER, CHILLS, NIGHTSWEATS, NAUSEA OR VOMITING, ELEVATED OR LOW BLOOD SUGARS, REPORT TO EMERGENCY ROOM. ? ?IF YOU EXPERIENCE INCREASED REDNESS, PAIN, SWELLING, DISCOLORATION, ODOR, PUS, DRAINAGE OR WARMTH OF YOUR FOOT, REPORT TO EMERGENCY ROOM.  ?

## 2021-10-13 NOTE — Telephone Encounter (Signed)
Called ,no answer, left vmessage of physician's instructions.

## 2021-10-13 NOTE — Progress Notes (Signed)
?  Subjective:  ?Patient ID: Eric Cummings, male    DOB: 1966/01/28,  MRN: [id number] ? ?Date of Injury: 10/11/2021 ? ?Eric Cummings presents to clinic today accompanied by his Mother. Patient's sister called earlier today reporting that Eric Cummings started limping on Saturday. Mom noticed he would toe-walk and not place his heel on the ground. She has not noticed any drainage from the foot. She states the foot is swollen. Eric Cummings is autistic and non-verbal. He has not had any fever, chills, night sweats, nausea or vomiting. ? ? ? ? ? ? ? ?Patient states blood glucose was 102 mg/dl today.   ? ?Last known HgA1c was 8.1%. ? ?PCP is [name], MD , and last visit was August 13, 2021. ? ?Allergies  ?Allergen Reactions  ? Ace Inhibitors   ?  Other reaction(s): cough ?Other reaction(s): cough  ? Iodine   ?  Other reaction(s): Unknown ?Other reaction(s): Unknown  ? Shellfish Allergy   ?  Other reaction(s): Unknown  ? Shellfish-Derived Products   ?  Other reaction(s): Unknown ?Other reaction(s): Unknown  ? Simvastatin   ?  Other reaction(s): intolerance to ?Other reaction(s): intolerance to  ? ? ?Review of Systems: Negative except as noted in the HPI. ? ?Objective: No changes noted in today's physical examination. ? ?Xray findings left foot: ?No gas in tissues left foot. ?No evidence of fracture left foot. ?Pes planus foot deformity left foot. ?Foreign body noted left plantar midfoot.  ? ? ? ? ?Assessment/Plan: ?1. Foreign body in left foot, initial encounter   ?  ?-Patient was evaluated and treated. All patient's and/or POA's questions/concerns answered on today's visit. ?-Consent given for treatment as described below: ?-Xrays obtained left foot revealed FBO. Mother notified. Verbal consent obtained for removal of foreign body. Left foot prepped with chlorhexidine scrub. Left foot injected with 3 cc  50/50 mix of 2% lidocaine plain and 0.5% marcaine plain. Exposed flap of skin debrided and I was able to see  glass fragment. Glass fragment removed en-toto with forceps. Post-procedure xrays obtained to confirm complete removal of FBO.Wound irrigated with alcohol and hydrogen peroxide. Silvadene dressing applied to left foot. Patient tolerated local injection and procedure well without complication or distress. Mother was relieved after removal of FBO. ?-Rx sent to pharmacy for doxycycline 100 mg po bid x 10 days. ?-Rx sent to pharmacy for mupirocin ointment to be applied to left foot wound once daily until healed. ?-Dispensed written instructions for post-procedure care to his Mother. ?-Patient's mother instructed to follow up with PCP regarding tetanus status. She related understanding. ?-I will have him follow up in one week for left foot check. ?-Patient/POA to call should there be question/concern in the interim.  ? ?No follow-ups on file. ? ?[name], DPM  ?

## 2021-10-13 NOTE — Telephone Encounter (Signed)
Patient's sister and caregiver is calling because he is walking like he is in pain(artistic), not sure if he is has a sore or blister on bottom of his foot but is hopping like his foot or his legs are hurting. ? Explained that she should check his feet to make sure there is nothing on the bottom of his feet, if so to call back to schedule an appointment. ?She verbalized understanding and said that she will call back. ?

## 2021-11-10 ENCOUNTER — Ambulatory Visit (INDEPENDENT_AMBULATORY_CARE_PROVIDER_SITE_OTHER): Payer: Medicare Other | Admitting: Podiatry

## 2021-11-10 DIAGNOSIS — S90852A Superficial foreign body, left foot, initial encounter: Secondary | ICD-10-CM | POA: Diagnosis not present

## 2021-11-11 NOTE — Progress Notes (Signed)
   HPI: 56 y.o. male presenting today nonverbal with autism presenting today with his caregiver for follow-up evaluation of a foreign body glass to the left plantar heel.  The patient's caregiver states that he is not limping as much as he used to.  His foot is feeling much better based on observations from his caregiver.  Presenting for follow-up treatment and evaluation.  He was seen in the office on 10/13/2021 with Dr. Donzetta Matters and at that time the glass was removed  Past Medical History:  Diagnosis Date   Diabetes mellitus without complication (HCC)     No past surgical history on file.  Allergies  Allergen Reactions   Ace Inhibitors     Other reaction(s): cough Other reaction(s): cough   Iodine     Other reaction(s): Unknown Other reaction(s): Unknown   Shellfish Allergy     Other reaction(s): Unknown   Shellfish-Derived Products     Other reaction(s): Unknown Other reaction(s): Unknown   Simvastatin     Other reaction(s): intolerance to Other reaction(s): intolerance to     Physical Exam: General: The patient is alert and oriented x3 in no acute distress.  Dermatology: Skin is warm, dry and supple bilateral lower extremities. Negative for open lesions or macerations.  The area of the glass foreign body has healed completely.  There is some callus to the area but there is no open wound  Vascular: Palpable pedal pulses bilaterally. Capillary refill within normal limits.  Negative for any significant edema or erythema  Neurological: Light touch and protective threshold grossly intact  Musculoskeletal Exam: No pedal deformities noted  Assessment: 1.  S/P removal foreign body glass LT heel   Plan of Care:  1. Patient evaluated.  2.  Light debridement of the hyperkeratotic skin on the plantar heel was performed using a 312 scalpel.  Patient tolerated this well. 3.  Patient may resume full activity no restrictions recommend good supportive shoes and sneakers 4.  Return to  clinic as needed   Felecia Shelling, DPM [facility]  Dr. Felecia Shelling, DPM    [address] [place], [place] [address]                Office [phone]  Fax [phone]

## 2021-12-29 ENCOUNTER — Ambulatory Visit: Payer: Medicare Other | Admitting: Podiatry

## 2022-03-10 ENCOUNTER — Encounter (HOSPITAL_COMMUNITY): Payer: Self-pay | Admitting: *Deleted

## 2022-03-10 ENCOUNTER — Ambulatory Visit (INDEPENDENT_AMBULATORY_CARE_PROVIDER_SITE_OTHER): Payer: Medicare Other

## 2022-03-10 ENCOUNTER — Other Ambulatory Visit: Payer: Self-pay

## 2022-03-10 ENCOUNTER — Ambulatory Visit (HOSPITAL_COMMUNITY)
Admission: EM | Admit: 2022-03-10 | Discharge: 2022-03-10 | Disposition: A | Discharge status: Home / Self Care | Payer: Medicare Other | Attending: Family Medicine | Admitting: Family Medicine

## 2022-03-10 ENCOUNTER — Ambulatory Visit (HOSPITAL_COMMUNITY): Payer: Medicare Other

## 2022-03-10 DIAGNOSIS — M79674 Pain in right toe(s): Secondary | ICD-10-CM

## 2022-03-10 DIAGNOSIS — M79675 Pain in left toe(s): Secondary | ICD-10-CM

## 2022-03-10 DIAGNOSIS — L609 Nail disorder, unspecified: Secondary | ICD-10-CM | POA: Diagnosis not present

## 2022-03-10 NOTE — ED Triage Notes (Addendum)
Pt 's Mother reports Pt has been limping on Lt foot . Pt also has a discoloration to Lt and RT great toenail Sister is the Care Giver to PT. Sister is concerned  about Pt's great toe because he is a diabetic.  Per care giver Pt is Autistic and non-verbal.

## 2022-03-10 NOTE — Discharge Instructions (Addendum)
I think it is certainly okay for the patient to take the regular strength Tylenol as needed.  I would make an appointment with his primary care to see if he needs any adjustment in any of his medications He has an appointment with podiatry for tomorrow; please go

## 2022-03-10 NOTE — ED Provider Notes (Signed)
[place]    CSN: [id number] Arrival date & time: 03/10/22  1752      History   Chief Complaint Chief Complaint  Patient presents with   Toe Pain    HPI Eric Cummings is a 56 y.o. male.    Toe Pain   Here for discoloration of his right great toe nail.  Mom feels that he has begun limping after he has been walking a bit.  She has tried Tylenol recently and she thought it helped, but then the pain has started bothering him again.  He already sees podiatry for onychomycosis and painful toes.  He does have diabetes and A1c in March was 8.1.  He has had no drainage or swelling of either toe.  Mom has noted that the toenails are darker on these toes, and she is concerned since he has a history of diabetes.  Patient is mute.  Mom is uncertain if he might of stubbed his toe on the right. Past Medical History:  Diagnosis Date   Diabetes mellitus without complication [facility])     Patient Active Problem List   Diagnosis Date Noted   Urinary incontinence 10/07/2021   Nocturia 05/15/2021   Encounter for screening for other disorder 04/04/2018   Diabetic peripheral neuropathy associated with type 2 diabetes mellitus ([place]) 01/08/2016   Diabetic renal disease ([place]) 01/08/2016   Adult failure to thrive syndrome 08/16/2015   Constipation 05/17/2015   Polyneuropathy 05/17/2015   Tinea unguium 05/17/2015   Cardiac arrhythmia 01/25/2015   Long term (current) use of insulin ([place]) 01/25/2015   Abnormal gait 09/14/2014   Type 1 diabetes mellitus without complications ([place]) 37/16/9678   Cough 11/30/2013   Syncope and collapse 08/21/2013   Impacted cerumen 07/29/2012   Underimmunization status 03/01/2012   Otalgia 11/27/2011   Gastro-esophageal reflux disease without esophagitis 06/20/2010   Allergic rhinitis 02/28/2010   Dental caries 06/07/2009   Hyperglycemia due to type 2 diabetes mellitus ([place]) 06/07/2009   Autistic disorder 05/28/2009   Dermatitis 05/28/2009    Essential hypertension 05/28/2009   Hyperlipidemia 05/28/2009   Obesity 05/28/2009   Profound intellectual disabilities 05/28/2009    History reviewed. No pertinent surgical history.     Home Medications    Prior to Admission medications   Medication Sig Start Date End Date Taking? Authorizing Provider  ACCU-CHEK AVIVA PLUS test strip 1 each 3 (three) times daily. 02/23/20   [provider]  Accu-Chek FastClix Lancets MISC Apply topically. 02/23/20   [provider]  amLODipine (NORVASC) 10 MG tablet Take 10 mg by mouth daily.    [provider]  B-D ULTRAFINE III SHORT PEN 31G X 8 MM MISC SMARTSIG:SUB-Q 1-3 Times Daily 08/08/20   [provider]  Blood Glucose Calibration (ACCU-CHEK AVIVA) SOLN use to calibrate meter Dx:  E11.65 07/02/14   [provider]  Canagliflozin 100 MG TABS Take by mouth.    [provider]  empagliflozin (JARDIANCE) 10 MG TABS tablet Take 1 tablet by mouth daily.    [provider]  EPINEPHrine (EPIPEN 2-PAK) 0.3 mg/0.3 mL IJ SOAJ injection use as directed for anaphylaxis prn 11/27/11   [provider]  fenofibrate 160 MG tablet Take 160 mg by mouth daily.    [provider]  fenofibrate 160 MG tablet Take 1 tablet by mouth daily.    [provider]  gabapentin (NEURONTIN) 100 MG capsule TK 1 C PO IN MORNING AND 3 CAPSULE HS 11/12/18   [provider]  glyBURIDE-metformin (GLUCOVANCE) 2.5-500 MG per tablet Take 1 tablet by mouth daily with breakfast.    [provider]  HUMALOG 100 UNIT/ML injection Inject into the skin. 10/09/20   [provider]  insulin glargine (LANTUS) 100 UNIT/ML injection Inject into the skin at bedtime.    [provider]  insulin lispro (HUMALOG) 100 UNIT/ML injection INJECT UNDER SKIN 0 UNIT(0-150), 3 UNITS(151-200), 5 UNITS(201-300) AND 7 UNITS IF GREATER THAN 301.    [provider]  Insulin  Syringe-Needle U-100 (INSULIN SYRINGE .5CC/31GX5/16") 31G X 5/16" 0.5 ML MISC USE UNDER THE SKIN UP TO THREE TIMES DAILY.    [provider]  Loratadine (CLARITIN) 10 MG CAPS one po HS Oral Once a day for 90 days    [provider]  metFORMIN (GLUCOPHAGE-XR) 500 MG 24 hr tablet TK 2 TS PO QAM AND 1 T QPM 11/14/18   [provider]  mupirocin ointment (BACTROBAN) 2 % Apply to left foot once daily until healed. 10/13/21   Marzetta Board, DPM  NONFORMULARY OR COMPOUNDED [place] compound:  Peripheral Neuropathy cream - Bupivacaine 1%, Doxepin 3%, Gabapentin 6%, Pentoxifylline 3%, Topiramate 1%, dispense 120 grams, apply 1-2 grams to affected area 3-4 times daily, +2refills. 09/06/15   Trula Slade, DPM  olmesartan (BENICAR) 20 MG tablet Take 20 mg by mouth daily.    [provider]  olmesartan (BENICAR) 20 MG tablet Take 1 tablet by mouth daily.    [provider]  omeprazole (PRILOSEC) 20 MG capsule Take 20 mg by mouth daily.    [provider]  polyethylene glycol powder (MIRALAX) 17 GM/SCOOP powder 1 packet mixed with 8 ounces of fluid Orally Once a day for 90 days    [provider]  sitaGLIPtin (JANUVIA) 100 MG tablet Take 1 tablet by mouth daily.    [provider]  triamcinolone (KENALOG) 0.025 % cream APP EXT BID 08/17/18   [provider]  triamcinolone (KENALOG) 0.025 % ointment Apply 1 application topically 2 (two) times daily. 06/08/18   Marzetta Board, DPM  triazolam (HALCION) 0.125 MG tablet TAKE 3 TABLETS BY MOUTH PRIOR TO DENTAL PROCEDURE AS DIRECTED BY DENTAL STAFF 03/07/18   [provider]    Family History History reviewed. No pertinent family history.  Social History Social History   Tobacco Use   Smoking status: Never   Smokeless tobacco: Never  Substance Use Topics   Alcohol use: No   Drug use: No     Allergies   Ace inhibitors, Iodine, Shellfish allergy,  Shellfish-derived products, and Simvastatin   Review of Systems Review of Systems   Physical Exam Triage Vital Signs ED Triage Vitals [03/10/22 1827]  Enc Vitals Group     BP (!) 158/78     Pulse Rate 77     Resp 16     Temp 98.9 F (37.2 C)     Temp src      SpO2 98 %     Weight      Height      Head Circumference      Peak Flow      Pain Score      Pain Loc      Pain Edu?      Excl. in [place]?    No data found.  Updated Vital Signs BP (!) 158/78   Pulse 77   Temp 98.9 F (37.2 C)   Resp 16   SpO2  98%   Visual Acuity Right Eye Distance:   Left Eye Distance:   Bilateral Distance:    Right Eye Near:   Left Eye Near:    Bilateral Near:     Physical Exam Vitals reviewed.  Constitutional:      General: He is not in acute distress.    Appearance: He is not toxic-appearing.  Musculoskeletal:     Comments: There is no swelling of either great toe.  There is discoloration and opacity and hypertrophy of both great toenails, and actually of all the toenails.  The great toenails and 2 or 3 others are actually a little darker than has been described in the past.  There is no drainage seen around the nail fold.  There is no erythema of the skin.  His pulses bilaterally at the DP area are normal  Skin:    Coloration: Skin is not jaundiced or pale.  Neurological:     Mental Status: He is oriented to person, place, and time.  Psychiatric:        Behavior: Behavior normal.      UC Treatments / Results  Labs (all labs ordered are listed, but only abnormal results are displayed) Labs Reviewed - No data to display  EKG   Radiology DG Toe Great Left  Result Date: 03/10/2022 CLINICAL DATA:  Change in toenail color. EXAM: LEFT GREAT TOE COMPARISON:  None Available. FINDINGS: There is no evidence of fracture or dislocation. No erosion or periosteal reaction. There is no evidence of arthropathy or other focal bone abnormality. Soft tissues are unremarkable. No soft  tissue gas or radiopaque foreign body. IMPRESSION: Negative radiographs of the left great toe. Electronically Signed   By: Keith Rake M.D.   On: 03/10/2022 19:45    Procedures Procedures (including critical care time)  Medications Ordered in UC Medications - No data to display  Initial Impression / Assessment and Plan / UC Course  I have reviewed the triage vital signs and the nursing notes.  Pertinent labs & imaging results that were available during my care of the patient were reviewed by me and considered in my medical decision making (see chart for details).        I x-rayed the toe that seem to be a little more discolored.  X-ray is negative for any bony pathology I am asking mom to help him get in with his primary care to see if they need to adjust any medications.  Also he can follow-up with podiatry for these issues.  I do not think he has any acute bacterial infection in his toe.  And he has an appointment with podiatry tomorrow  Final Clinical Impressions(s) / UC Diagnoses   Final diagnoses:  Pain in toes of both feet     Discharge Instructions      I think it is certainly okay for the patient to take the regular strength Tylenol as needed.  I would make an appointment with his primary care to see if he needs any adjustment in any of his medications He has an appointment with podiatry for tomorrow; please go      ED Prescriptions   None    PDMP not reviewed this encounter.   Barrett Henle, MD 03/10/22 [phone]

## 2022-03-11 ENCOUNTER — Ambulatory Visit (INDEPENDENT_AMBULATORY_CARE_PROVIDER_SITE_OTHER): Payer: Medicare Other | Admitting: Podiatry

## 2022-03-11 ENCOUNTER — Encounter: Payer: Self-pay | Admitting: Podiatry

## 2022-03-11 DIAGNOSIS — B351 Tinea unguium: Secondary | ICD-10-CM

## 2022-03-11 DIAGNOSIS — M722 Plantar fascial fibromatosis: Secondary | ICD-10-CM | POA: Diagnosis not present

## 2022-03-11 DIAGNOSIS — M79675 Pain in left toe(s): Secondary | ICD-10-CM

## 2022-03-11 DIAGNOSIS — M79674 Pain in right toe(s): Secondary | ICD-10-CM | POA: Diagnosis not present

## 2022-03-11 NOTE — Progress Notes (Signed)
Subjective:   Patient ID: Eric Cummings, male   DOB: 56 y.o.   MRN: [id number]   HPI Patient presents with caregiver concerned about nail disease 1-5 both feet that he cannot take care of and limping and concerned about heel pain    ROS      Objective:  Physical Exam  Neurovascular status intact muscle strength adequate with patient's left heel being mildly tender but I could not find an area of severe tenderness and patient is noncommunicative with thick yellow brittle nailbeds 1-5 both feet that seem to become painful and get irritated      Assessment:  Mycotic nail infection with pain 1-5 both feet and plantar fascial inflammation left moderate in its intensity     Plan:  Do not recommend treatment for fasciitis did review this with caregiver and shoe gear modifications are probably best for this long-term and debrided nailbeds 1-5 both feet no angiogenic bleeding reappoint routine care

## 2022-04-08 ENCOUNTER — Ambulatory Visit (INDEPENDENT_AMBULATORY_CARE_PROVIDER_SITE_OTHER): Payer: Medicare Other | Admitting: Podiatry

## 2022-04-08 ENCOUNTER — Encounter: Payer: Self-pay | Admitting: Podiatry

## 2022-04-08 DIAGNOSIS — E1142 Type 2 diabetes mellitus with diabetic polyneuropathy: Secondary | ICD-10-CM

## 2022-04-08 DIAGNOSIS — M79674 Pain in right toe(s): Secondary | ICD-10-CM | POA: Diagnosis not present

## 2022-04-08 DIAGNOSIS — M79675 Pain in left toe(s): Secondary | ICD-10-CM

## 2022-04-08 DIAGNOSIS — B351 Tinea unguium: Secondary | ICD-10-CM | POA: Diagnosis not present

## 2022-04-13 NOTE — Progress Notes (Signed)
  Subjective:  Patient ID: Eric Cummings, male    DOB: March 03, 1966,  MRN: [id number]  Eric Cummings presents to clinic today for at risk foot care with history of diabetic neuropathy and painful thick toenails that are difficult to trim. Pain interferes with ambulation. Aggravating factors include wearing enclosed shoe gear. Pain is relieved with periodic professional debridement.   Patient is nonverbal. He is accompanied by his mother on today's visit. Chief Complaint  Patient presents with   Nail Problem    Dfc BG - 121  A1C - 7.1  PCP - Dr [name]    Foot Pain    Pt state a small spot on left foot around his ankle area    New problem(s): None.   PCP is [name], MD , and last visit was March 11, 2022.  Allergies  Allergen Reactions   Ace Inhibitors     Other reaction(s): cough Other reaction(s): cough   Iodine     Other reaction(s): Unknown Other reaction(s): Unknown   Shellfish Allergy     Other reaction(s): Unknown   Shellfish-Derived Products     Other reaction(s): Unknown Other reaction(s): Unknown   Simvastatin     Other reaction(s): intolerance to Other reaction(s): intolerance to    Review of Systems: Negative except as noted in the HPI.  Objective:   Vascular Examination: CFT <3 seconds b/l LE. Palpable DP/PT pulses b/l LE. Digital hair absent b/l. Skin temperature gradient WNL b/l. No pain with calf compression b/l. No edema noted b/l. No cyanosis or clubbing noted b/l LE.  Dermatological Examination: Pedal skin is warm and supple b/l LE. No open wounds b/l LE. No interdigital macerations noted b/l LE. Toenails 1-5 b/l elongated, discolored, dystrophic, thickened, crumbly with subungual debris and tenderness to dorsal palpation.   He has small annular healing scab on medial aspect of left ankle. No edema, no erythema, no drainage, no fluctuance.  Musculoskeletal Examination: Normal muscle strength 5/5 to all lower extremity muscle groups  bilaterally. Pes planus deformity noted bilateral LE.Marland Kitchen No pain, crepitus or joint limitation noted with ROM b/l LE.  Patient ambulates independently without assistive aids.  Neurological Examination: Pt has subjective symptoms of neuropathy. Protective sensation intact 5/5 intact bilaterally with 10g monofilament b/l. Vibratory sensation intact b/l.  Eric Cummings is a pleasant 56 y.o. male in NAD. AAO x 3.  Assessment/Plan: 1. Pain due to onychomycosis of toenails of both feet   2. Diabetic peripheral neuropathy associated with type 2 diabetes mellitus (HCC)     No orders of the defined types were placed in this encounter.   -Patient's family member present. All questions/concerns addressed on today's visit. -Patient has healing scab noted medial aspect of left ankle. Monitor. -Continue supportive shoe gear daily. -Mycotic toenails 1-5 bilaterally were debrided in length and girth with sterile nail nippers and dremel without incident. -Patient/POA to call should there be question/concern in the interim.   Return in about 3 months (around 07/09/2022).  [name], DPM

## 2022-08-03 ENCOUNTER — Ambulatory Visit (INDEPENDENT_AMBULATORY_CARE_PROVIDER_SITE_OTHER): Payer: 59 | Admitting: Podiatry

## 2022-08-03 ENCOUNTER — Encounter: Payer: Self-pay | Admitting: Podiatry

## 2022-08-03 DIAGNOSIS — M79674 Pain in right toe(s): Secondary | ICD-10-CM | POA: Diagnosis not present

## 2022-08-03 DIAGNOSIS — B351 Tinea unguium: Secondary | ICD-10-CM

## 2022-08-03 DIAGNOSIS — E1142 Type 2 diabetes mellitus with diabetic polyneuropathy: Secondary | ICD-10-CM | POA: Diagnosis not present

## 2022-08-03 DIAGNOSIS — M79675 Pain in left toe(s): Secondary | ICD-10-CM

## 2022-08-03 DIAGNOSIS — M2142 Flat foot [pes planus] (acquired), left foot: Secondary | ICD-10-CM

## 2022-08-03 DIAGNOSIS — M2141 Flat foot [pes planus] (acquired), right foot: Secondary | ICD-10-CM | POA: Diagnosis not present

## 2022-08-03 DIAGNOSIS — E119 Type 2 diabetes mellitus without complications: Secondary | ICD-10-CM

## 2022-08-03 NOTE — Progress Notes (Unsigned)
ANNUAL DIABETIC FOOT EXAM  Subjective: Eric Cummings presents today {jgcomplaint:23593}.  Chief Complaint  Patient presents with   Nail Problem    DFC BS-127 A1C-7.1 PCP-[place] PCP VST-11/2021    Patient confirms h/o diabetes.  Patient relates {Numbers; 0-100:15068} year h/o diabetes.  Patient denies any h/o foot wounds.  Patient has h/o foot ulcer of {jgPodToeLocator:23637}, which healed via help of ***.  Patient has h/o amputation(s): {jgamp:23617}.  Patient endorses symptoms of foot numbness.   Patient endorses symptoms of foot tingling.  Patient endorses symptoms of burning in feet.  Patient endorses symptoms of pins/needles sensation in feet.  Patient denies any numbness, tingling, burning, or pins/needle sensation in feet.  Patient has been diagnosed with neuropathy and it is managed with {JGNEUROPATHYMEDS:27053}.  Risk factors: {jgriskfactors:24044}.  Shon Baton, MD is patient's PCP. Last visit was {Time; dates multiple:15870}***.  Past Medical History:  Diagnosis Date   Diabetes mellitus without complication [facility])    Patient Active Problem List   Diagnosis Date Noted   Urinary incontinence 10/07/2021   Nocturia 05/15/2021   Encounter for screening for other disorder 04/04/2018   Diabetic peripheral neuropathy associated with type 2 diabetes mellitus ([place]) 01/08/2016   Diabetic renal disease ([place]) 01/08/2016   Adult failure to thrive syndrome 08/16/2015   Constipation 05/17/2015   Polyneuropathy 05/17/2015   Tinea unguium 05/17/2015   Cardiac arrhythmia 01/25/2015   Long term (current) use of insulin ([place]) 01/25/2015   Abnormal gait 09/14/2014   Type 1 diabetes mellitus without complications ([place]) [id number]   Cough 11/30/2013   Syncope and collapse 08/21/2013   Impacted cerumen 07/29/2012   Underimmunization status 03/01/2012   Otalgia 11/27/2011   Gastro-esophageal reflux disease without esophagitis 06/20/2010   Allergic rhinitis  02/28/2010   Dental caries 06/07/2009   Hyperglycemia due to type 2 diabetes mellitus ([place]) 06/07/2009   Autistic disorder 05/28/2009   Dermatitis 05/28/2009   Essential hypertension 05/28/2009   Hyperlipidemia 05/28/2009   Obesity 05/28/2009   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 No pertinent surgical history. Current Outpatient Medications on File Prior to Visit  Medication Sig Dispense Refill   ACCU-CHEK AVIVA PLUS test strip 1 each 3 (three) times daily.     Accu-Chek FastClix Lancets MISC Apply topically.     amLODipine (NORVASC) 10 MG tablet Take 10 mg by mouth daily.     B-D ULTRAFINE III SHORT PEN 31G X 8 MM MISC SMARTSIG:SUB-Q 1-3 Times Daily     Blood Glucose Calibration (ACCU-CHEK AVIVA) SOLN use to calibrate meter Dx:  E11.65     Canagliflozin 100 MG TABS Take by mouth.     empagliflozin (JARDIANCE) 10 MG TABS tablet Take 1 tablet by mouth daily.     EPINEPHrine (EPIPEN 2-PAK) 0.3 mg/0.3 mL IJ SOAJ injection use as directed for anaphylaxis prn     fenofibrate 160 MG tablet Take 160 mg by mouth daily.     fenofibrate 160 MG tablet Take 1 tablet by mouth daily.     fenofibrate 160 MG tablet TAKE 1 TABLET BY MOUTH EVERY DAY Orally Once a day for 90 days     gabapentin (NEURONTIN) 100 MG capsule TK 1 C PO IN MORNING AND 3 CAPSULE HS     glyBURIDE-metformin (GLUCOVANCE) 2.5-500 MG per tablet Take 1 tablet by mouth daily with breakfast.     HUMALOG 100 UNIT/ML injection Inject into the skin.     Incontinence Supply Disposable (DEPEND EASY FIT UNDERGARMENTS) MISC 3 per day as  directed TID for 90 days     insulin glargine (LANTUS) 100 UNIT/ML injection Inject into the skin at bedtime.     insulin lispro (HUMALOG) 100 UNIT/ML injection INJECT UNDER SKIN 0 UNIT(0-150), 3 UNITS(151-200), 5 UNITS(201-300) AND 7 UNITS IF GREATER THAN 301.     Insulin Syringe-Needle U-100 (INSULIN SYRINGE .5CC/31GX5/16") 31G X 5/16" 0.5 ML MISC USE UNDER THE SKIN UP TO THREE  TIMES DAILY.     Loratadine (CLARITIN) 10 MG CAPS one po HS Oral Once a day for 90 days     metFORMIN (GLUCOPHAGE-XR) 500 MG 24 hr tablet TK 2 TS PO QAM AND 1 T QPM     mupirocin ointment (BACTROBAN) 2 % Apply to left foot once daily until healed. 22 g 1   NONFORMULARY OR COMPOUNDED [place] compound:  Peripheral Neuropathy cream - Bupivacaine 1%, Doxepin 3%, Gabapentin 6%, Pentoxifylline 3%, Topiramate 1%, dispense 120 grams, apply 1-2 grams to affected area 3-4 times daily, +2refills. 120 each 2   olmesartan (BENICAR) 20 MG tablet Take 20 mg by mouth daily.     olmesartan (BENICAR) 20 MG tablet Take 1 tablet by mouth daily.     omeprazole (PRILOSEC) 20 MG capsule Take 20 mg by mouth daily.     polyethylene glycol (MIRALAX) 17 g packet 1 packet mixed with 8 ounces of fluid Orally Once a day for 90 days     polyethylene glycol powder (MIRALAX) 17 GM/SCOOP powder 1 packet mixed with 8 ounces of fluid Orally Once a day for 90 days     sitaGLIPtin (JANUVIA) 100 MG tablet Take 1 tablet by mouth daily.     triamcinolone (KENALOG) 0.025 % cream APP EXT BID     triamcinolone (KENALOG) 0.025 % ointment Apply 1 application topically 2 (two) times daily. 30 g 0   triazolam (HALCION) 0.125 MG tablet TAKE 3 TABLETS BY MOUTH PRIOR TO DENTAL PROCEDURE AS DIRECTED BY DENTAL STAFF  0   No current facility-administered medications on file prior to visit.    Allergies  Allergen Reactions   Ace Inhibitors     Other reaction(s): cough Other reaction(s): cough   Iodine     Other reaction(s): Unknown Other reaction(s): Unknown   Shellfish Allergy     Other reaction(s): Unknown   Shellfish-Derived Products     Other reaction(s): Unknown Other reaction(s): Unknown   Simvastatin     Other reaction(s): intolerance to Other reaction(s): intolerance to   Social History   Occupational History   Not on file  Tobacco Use   Smoking status: Never   Smokeless tobacco: Never  Substance and Sexual  Activity   Alcohol use: No   Drug use: No   Sexual activity: Not on file   History reviewed. No pertinent family history. Immunization History  Administered Date(s) Administered   PFIZER(Purple Top)SARS-COV-2 Vaccination 04/13/2020   Tdap 08/23/2017     Review of Systems: Negative except as noted in the HPI.   Objective: There were no vitals filed for this visit.  Eric Cummings is a pleasant 57 y.o. male in NAD. AAO X 3.  Vascular Examination: {jgvascular:23595}  Dermatological Examination: {jgderm:23598}  Neurological Examination: {jgneuro:23601::"Protective sensation intact 5/5 intact bilaterally with 10g monofilament b/l.","Vibratory sensation intact b/l.","Proprioception intact bilaterally."}  Musculoskeletal Examination: {jgmsk:23600}  Footwear Assessment: Does the patient wear appropriate shoes? {Yes,No}. Does the patient need inserts/orthotics? {Yes,No}.  No results found for: "HGBA1C" No results found. ADA Risk Categorization: Low Risk :  Patient has all of  the following: Intact protective sensation No prior foot ulcer  No severe deformity Pedal pulses present  High Risk  Patient has one or more of the following: Loss of protective sensation Absent pedal pulses Severe Foot deformity History of foot ulcer  Assessment: No diagnosis found.   Plan: No orders of the defined types were placed in this encounter.   No orders of the defined types were placed in this encounter.   None  {jgplan:23602::"-Patient/POA to call should there be question/concern in the interim."} Return in about 3 months (around 11/03/2022).  Marzetta Board, DPM

## 2022-11-04 ENCOUNTER — Encounter: Payer: Self-pay | Admitting: Podiatry

## 2022-11-04 ENCOUNTER — Ambulatory Visit (INDEPENDENT_AMBULATORY_CARE_PROVIDER_SITE_OTHER): Payer: 59 | Admitting: Podiatry

## 2022-11-04 DIAGNOSIS — M79675 Pain in left toe(s): Secondary | ICD-10-CM | POA: Diagnosis not present

## 2022-11-04 DIAGNOSIS — M79674 Pain in right toe(s): Secondary | ICD-10-CM

## 2022-11-04 DIAGNOSIS — E1142 Type 2 diabetes mellitus with diabetic polyneuropathy: Secondary | ICD-10-CM

## 2022-11-04 DIAGNOSIS — B351 Tinea unguium: Secondary | ICD-10-CM

## 2022-11-04 NOTE — Progress Notes (Signed)
This patient returns to my office for at risk foot care.  This patient requires this care by a professional since this patient will be at risk due to having diabetes and autism.  This patient is unable to cut nails himself since the patient cannot reach his nails.These nails are painful walking and wearing shoes.  This patient presents for at risk foot care today.  General Appearance  Alert, conversant and in no acute stress.  Vascular  Dorsalis pedis and posterior tibial  pulses are palpable  bilaterally.  Capillary return is within normal limits  bilaterally. Temperature is within normal limits  bilaterally.  Neurologic  Senn-Weinstein monofilament wire test within normal limits  bilaterally. Muscle power within normal limits bilaterally.  Nails Thick disfigured discolored nails with subungual debris  from hallux to fifth toes bilaterally. No evidence of bacterial infection or drainage bilaterally.  Orthopedic  No limitations of motion  feet .  No crepitus or effusions noted.  No bony pathology or digital deformities noted. Pes planus  B/L.  Skin  normotropic skin with no porokeratosis noted bilaterally.  No signs of infections or ulcers noted.     Onychomycosis  Pain in right toes  Pain in left toes  Consent was obtained for treatment procedures.   Mechanical debridement of nails 1-5  bilaterally performed with a nail nipper.  Filed with dremel without incident.    Return office visit   3 months                   Told patient to return for periodic foot care and evaluation due to potential at risk complications.   Helane Gunther DPM

## 2023-02-08 ENCOUNTER — Ambulatory Visit: Payer: 59 | Admitting: Podiatry

## 2023-02-17 ENCOUNTER — Ambulatory Visit: Payer: 59

## 2023-02-17 DIAGNOSIS — E1142 Type 2 diabetes mellitus with diabetic polyneuropathy: Secondary | ICD-10-CM

## 2023-02-17 DIAGNOSIS — M2141 Flat foot [pes planus] (acquired), right foot: Secondary | ICD-10-CM

## 2023-02-17 DIAGNOSIS — M722 Plantar fascial fibromatosis: Secondary | ICD-10-CM

## 2023-02-17 NOTE — Progress Notes (Signed)
Patient presents to the office today for diabetic shoe and insole measuring.  Patient was measured with brannock device to determine size and width for 1 pair of extra depth shoes and foam casted for 3 pair of insoles.   Documentation of medical necessity will be sent to patient's treating diabetic doctor to verify and sign.   Patient's diabetic provider: [name] MD   Shoes and insoles will be ordered at that time and patient will be notified for an appointment for fitting when they arrive.   Shoe size (per patient): 13W Brannock measurement: 12 Patient shoe selection- Shoe choice:   [id number] / patient wears balance Braces BIL Shoe size ordered: 13WD  2nd anything velcro and black  ABN and financial forms signed by sister   Addison Bailey Cped, CFo, CFm

## 2023-03-14 NOTE — Progress Notes (Signed)
Shoes and inserts in called patient LM on home number for fitting appt  Called Mobile spoke with brother appt set 10/28 2pm  Addison Bailey CPed, CFo, CFm

## 2023-03-29 ENCOUNTER — Ambulatory Visit (INDEPENDENT_AMBULATORY_CARE_PROVIDER_SITE_OTHER): Payer: 59

## 2023-03-29 ENCOUNTER — Encounter: Payer: Self-pay | Admitting: Podiatry

## 2023-03-29 ENCOUNTER — Ambulatory Visit (INDEPENDENT_AMBULATORY_CARE_PROVIDER_SITE_OTHER): Payer: 59 | Admitting: Podiatry

## 2023-03-29 DIAGNOSIS — M722 Plantar fascial fibromatosis: Secondary | ICD-10-CM

## 2023-03-29 DIAGNOSIS — M79675 Pain in left toe(s): Secondary | ICD-10-CM | POA: Diagnosis not present

## 2023-03-29 DIAGNOSIS — E1142 Type 2 diabetes mellitus with diabetic polyneuropathy: Secondary | ICD-10-CM | POA: Diagnosis not present

## 2023-03-29 DIAGNOSIS — M2141 Flat foot [pes planus] (acquired), right foot: Secondary | ICD-10-CM | POA: Diagnosis not present

## 2023-03-29 DIAGNOSIS — M2142 Flat foot [pes planus] (acquired), left foot: Secondary | ICD-10-CM

## 2023-03-29 DIAGNOSIS — M79674 Pain in right toe(s): Secondary | ICD-10-CM

## 2023-03-29 DIAGNOSIS — B351 Tinea unguium: Secondary | ICD-10-CM | POA: Diagnosis not present

## 2023-03-29 DIAGNOSIS — M2010 Hallux valgus (acquired), unspecified foot: Secondary | ICD-10-CM

## 2023-03-29 NOTE — Progress Notes (Signed)

## 2023-03-29 NOTE — Progress Notes (Signed)
This patient returns to my office for at risk foot care.  This patient requires this care by a professional since this patient will be at risk due to having diabetes and autism.  This patient is unable to cut nails himself since the patient cannot reach his nails.These nails are painful walking and wearing shoes.  This patient presents for at risk foot care today.  General Appearance  Alert, conversant and in no acute stress.  Vascular  Dorsalis pedis and posterior tibial  pulses are palpable  bilaterally.  Capillary return is within normal limits  bilaterally. Temperature is within normal limits  bilaterally.  Neurologic  Senn-Weinstein monofilament wire test diminished  bilaterally. Muscle power within normal limits bilaterally.  Nails Thick disfigured discolored nails with subungual debris  from hallux to fifth toes bilaterally. No evidence of bacterial infection or drainage bilaterally.  Orthopedic  No limitations of motion  feet .  No crepitus or effusions noted.  No bony pathology or digital deformities noted. Pes planus  B/L.  Skin  normotropic skin with no porokeratosis noted bilaterally.  No signs of infections or ulcers noted.     Onychomycosis  Pain in right toes  Pain in left toes  Consent was obtained for treatment procedures.   Mechanical debridement of nails 1-5  bilaterally performed with a nail nipper.  Filed with dremel without incident. Patient qualifies for diabetic shoes due to DPN, pes planus and HAV.  Patient has appointment with Nicki Guadalajara at 2.   Return office visit   3 months                   Told patient to return for periodic foot care and evaluation due to potential at risk complications.   Helane Gunther DPM

## 2023-04-07 ENCOUNTER — Ambulatory Visit: Payer: 59

## 2023-04-07 ENCOUNTER — Encounter: Payer: Self-pay | Admitting: Podiatry

## 2023-04-07 ENCOUNTER — Ambulatory Visit (INDEPENDENT_AMBULATORY_CARE_PROVIDER_SITE_OTHER): Payer: 59 | Admitting: Podiatry

## 2023-04-07 DIAGNOSIS — M25371 Other instability, right ankle: Secondary | ICD-10-CM

## 2023-04-07 DIAGNOSIS — M25372 Other instability, left ankle: Secondary | ICD-10-CM

## 2023-04-07 DIAGNOSIS — Z9181 History of falling: Secondary | ICD-10-CM

## 2023-04-07 DIAGNOSIS — M2141 Flat foot [pes planus] (acquired), right foot: Secondary | ICD-10-CM

## 2023-04-07 DIAGNOSIS — M2010 Hallux valgus (acquired), unspecified foot: Secondary | ICD-10-CM

## 2023-04-07 DIAGNOSIS — I999 Unspecified disorder of circulatory system: Secondary | ICD-10-CM

## 2023-04-07 DIAGNOSIS — E1142 Type 2 diabetes mellitus with diabetic polyneuropathy: Secondary | ICD-10-CM | POA: Diagnosis not present

## 2023-04-07 NOTE — Progress Notes (Signed)
Subjective:  Patient ID: Eric Cummings, male    DOB: 1965/11/30,  MRN: [id number]  Chief Complaint  Patient presents with   Routine Post Op    PATIENT CAREGIVER STATES THAT HE NEEDS REFERRAL FOR LEG BRACE , HE TAKES MEDICATION FOR PAIN     57 y.o. male presents with the above complaint.  Patient presents with complaint of bilateral history of fall with ankle giving out.  Patient states that he does not have much stabilization while ambulating.  He would like to discuss balance bracing.  He also has secondary complaint of claudication type of pain.  He does not have any open wounds or lesion he would like to discuss treatment options for that.  He has not had ABIs PVRs done.  He is a diabetic.   Review of Systems: Negative except as noted in the HPI. Denies N/V/F/Ch.  Past Medical History:  Diagnosis Date   Diabetes mellitus without complication (HCC)     Current Outpatient Medications:    ACCU-CHEK AVIVA PLUS test strip, 1 each 3 (three) times daily., Disp: , Rfl:    Accu-Chek FastClix Lancets MISC, Apply topically., Disp: , Rfl:    amLODipine (NORVASC) 10 MG tablet, Take 10 mg by mouth daily., Disp: , Rfl:    B-D ULTRAFINE III SHORT PEN 31G X 8 MM MISC, SMARTSIG:SUB-Q 1-3 Times Daily, Disp: , Rfl:    Blood Glucose Calibration (ACCU-CHEK AVIVA) SOLN, use to calibrate meter Dx:  E11.65, Disp: , Rfl:    Canagliflozin 100 MG TABS, Take by mouth., Disp: , Rfl:    empagliflozin (JARDIANCE) 10 MG TABS tablet, Take 1 tablet by mouth daily., Disp: , Rfl:    EPINEPHrine (EPIPEN 2-PAK) 0.3 mg/0.3 mL IJ SOAJ injection, use as directed for anaphylaxis prn, Disp: , Rfl:    fenofibrate 160 MG tablet, Take 160 mg by mouth daily., Disp: , Rfl:    fenofibrate 160 MG tablet, Take 1 tablet by mouth daily., Disp: , Rfl:    fenofibrate 160 MG tablet, TAKE 1 TABLET BY MOUTH EVERY DAY Orally Once a day for 90 days, Disp: , Rfl:    gabapentin (NEURONTIN) 100 MG capsule, TK 1 C PO IN MORNING AND 3  CAPSULE HS, Disp: , Rfl:    glyBURIDE-metformin (GLUCOVANCE) 2.5-500 MG per tablet, Take 1 tablet by mouth daily with breakfast., Disp: , Rfl:    HUMALOG 100 UNIT/ML injection, Inject into the skin., Disp: , Rfl:    Incontinence Supply Disposable (DEPEND EASY FIT UNDERGARMENTS) MISC, 3 per day as directed TID for 90 days, Disp: , Rfl:    insulin glargine (LANTUS) 100 UNIT/ML injection, Inject into the skin at bedtime., Disp: , Rfl:    insulin lispro (HUMALOG) 100 UNIT/ML injection, INJECT UNDER SKIN 0 UNIT(0-150), 3 UNITS(151-200), 5 UNITS(201-300) AND 7 UNITS IF GREATER THAN 301., Disp: , Rfl:    Insulin Syringe-Needle U-100 (INSULIN SYRINGE .5CC/31GX5/16") 31G X 5/16" 0.5 ML MISC, USE UNDER THE SKIN UP TO THREE TIMES DAILY., Disp: , Rfl:    Loratadine (CLARITIN) 10 MG CAPS, one po HS Oral Once a day for 90 days, Disp: , Rfl:    metFORMIN (GLUCOPHAGE-XR) 500 MG 24 hr tablet, TK 2 TS PO QAM AND 1 T QPM, Disp: , Rfl:    mupirocin ointment (BACTROBAN) 2 %, Apply to left foot once daily until healed., Disp: 22 g, Rfl: 1   NONFORMULARY OR COMPOUNDED ITEM, [facility] compound:  Peripheral Neuropathy cream - Bupivacaine 1%, Doxepin 3%, Gabapentin 6%,  Pentoxifylline 3%, Topiramate 1%, dispense 120 grams, apply 1-2 grams to affected area 3-4 times daily, +2refills., Disp: 120 each, Rfl: 2   olmesartan (BENICAR) 20 MG tablet, Take 20 mg by mouth daily., Disp: , Rfl:    olmesartan (BENICAR) 20 MG tablet, Take 1 tablet by mouth daily., Disp: , Rfl:    omeprazole (PRILOSEC) 20 MG capsule, Take 20 mg by mouth daily., Disp: , Rfl:    polyethylene glycol (MIRALAX) 17 g packet, 1 packet mixed with 8 ounces of fluid Orally Once a day for 90 days, Disp: , Rfl:    polyethylene glycol powder (MIRALAX) 17 GM/SCOOP powder, 1 packet mixed with 8 ounces of fluid Orally Once a day for 90 days, Disp: , Rfl:    sitaGLIPtin (JANUVIA) 100 MG tablet, Take 1 tablet by mouth daily., Disp: , Rfl:    triamcinolone (KENALOG)  0.025 % cream, APP EXT BID, Disp: , Rfl:    triamcinolone (KENALOG) 0.025 % ointment, Apply 1 application topically 2 (two) times daily., Disp: 30 g, Rfl: 0   triazolam (HALCION) 0.125 MG tablet, TAKE 3 TABLETS BY MOUTH PRIOR TO DENTAL PROCEDURE AS DIRECTED BY DENTAL STAFF, Disp: , Rfl: 0  Social History   Tobacco Use  Smoking Status Never  Smokeless Tobacco Never    Allergies  Allergen Reactions   Ace Inhibitors     Other reaction(s): cough Other reaction(s): cough   Iodine     Other reaction(s): Unknown Other reaction(s): Unknown   Shellfish Allergy     Other reaction(s): Unknown   Shellfish-Derived Products     Other reaction(s): Unknown Other reaction(s): Unknown   Simvastatin     Other reaction(s): intolerance to Other reaction(s): intolerance to   Objective:  There were no vitals filed for this visit. There is no height or weight on file to calculate BMI. Constitutional Well developed. Well nourished.  Vascular Dorsalis pedis pulses nonpalpable bilaterally. Posterior tibial pulses not palpable bilaterally. Capillary refill normal to all digits.  No cyanosis or clubbing noted. Pedal hair growth normal.  Neurologic Normal speech. Oriented to person, place, and time. Epicritic sensation to light touch grossly present bilaterally.  Dermatologic Nails well groomed and normal in appearance. No open wounds. No skin lesions.  Orthopedic: Positive pain at ATFL ligament with some ligament laxity.  Negative anterior drawer test or talar tilt test noted.  Subjective component with history of fall noted multiple times a year    Radiographs: None Assessment:   1. Vascular abnormality   2. Instability of right ankle joint   3. Left ankle instability   4. Diabetic peripheral neuropathy associated with type 2 diabetes mellitus (HCC)   5. History of fall    Plan:  Patient was evaluated and treated and all questions answered.  Bilateral ankle instability with history of  following -All questions and concerns were discussed with the patient in extensive detail given the nature of instability in the setting of multiple falls especially in this last year patient will benefit from more balance bracing he will be scheduled to see Trish with orthotics department for more balance bracing  Vascular abnormality right sided -ABIs PVRs were ordered to assess the blood flow to the right lower extremity

## 2023-04-19 ENCOUNTER — Ambulatory Visit (HOSPITAL_COMMUNITY)
Admission: RE | Admit: 2023-04-19 | Discharge: 2023-04-19 | Disposition: A | Discharge status: Home / Self Care | Payer: 59 | Source: Ambulatory Visit | Attending: Podiatry | Admitting: Podiatry

## 2023-04-19 DIAGNOSIS — I999 Unspecified disorder of circulatory system: Secondary | ICD-10-CM | POA: Diagnosis present

## 2023-04-20 LAB — VAS US ABI WITH/WO TBI
Left ABI: 1.13
Right ABI: 1.08

## 2023-05-07 NOTE — Progress Notes (Signed)
[employer] Home Search  search Payer  [id number] - [employer] Provider  [facility] Home / Prior authorizations & notifications / Inquiry response  Print Prior authorization procedure code inquiry response  Start a new task 1,Inquiry request, completed 2, current step,Inquiry response, not completed Product type, state and diagnosis code Medical services only. Excludes [employer]. Product type  info Medicare [facility] Diagnosis code and description M25.371 Other instability, right ankle This is paired with selected procedure codes Inquiry response Use the "Proceed with submission" button to proceed with a prior authorization submission for the services below. Procedure code [phone] Description Addition to lower extremity orthosis, soft interface for molded plastic, below knee section Inquiry summary Notification/Prior Authorization not required for this service. Show place of service breakdown Procedure code [phone] Description Ankle-foot orthosis (AFO), plastic or other material, custom fabricated Inquiry summary Notification/Prior Authorization not required for this service. Show place of service breakdown Procedure code [phone] Description Addition to lower extremity, lacer molded to patient model, for custom fabricated orthosis only Inquiry summary Notification/Prior Authorization not required for this service. Hide place of service breakdown    Above attempt for Prior Berkley Harvey / stated not needed

## 2023-05-21 ENCOUNTER — Telehealth: Payer: Self-pay

## 2023-05-21 NOTE — Telephone Encounter (Signed)
Called pt to schedule a brace pick up appt. Left him a voicemail

## 2023-06-03 ENCOUNTER — Telehealth: Payer: Self-pay

## 2023-06-03 NOTE — Telephone Encounter (Signed)
 Appt for balance brace fitting made by [facility] 12/27 and set for 1/22 at 3pm  Charges will need to be added at pick up

## 2023-06-23 ENCOUNTER — Ambulatory Visit (INDEPENDENT_AMBULATORY_CARE_PROVIDER_SITE_OTHER): Payer: 59

## 2023-06-23 DIAGNOSIS — M2141 Flat foot [pes planus] (acquired), right foot: Secondary | ICD-10-CM

## 2023-06-23 DIAGNOSIS — M25371 Other instability, right ankle: Secondary | ICD-10-CM | POA: Diagnosis not present

## 2023-06-23 DIAGNOSIS — M2142 Flat foot [pes planus] (acquired), left foot: Secondary | ICD-10-CM

## 2023-06-23 DIAGNOSIS — Z9181 History of falling: Secondary | ICD-10-CM | POA: Diagnosis not present

## 2023-06-23 DIAGNOSIS — M25372 Other instability, left ankle: Secondary | ICD-10-CM | POA: Diagnosis not present

## 2023-06-23 DIAGNOSIS — E1142 Type 2 diabetes mellitus with diabetic polyneuropathy: Secondary | ICD-10-CM

## 2023-06-23 NOTE — Progress Notes (Signed)
Patient presents today to pick up custom molded AFO's balance braces with Sister patient diagnosed with HX of fall BIL ankle weakness, DM Neuropathy  Orthosis was dispensed and fit was good patient was very happy with function. Brace adds support and stability to ankle and will also help to slow progression of ankle deformity.  Patient reviewed with me instructions for break-in and wear. Written instructions given to patient.  Patient will follow up as needed.   Addison Bailey Cped, CFo, CFm

## 2023-06-28 ENCOUNTER — Encounter: Payer: Self-pay | Admitting: Podiatry

## 2023-06-28 ENCOUNTER — Ambulatory Visit (INDEPENDENT_AMBULATORY_CARE_PROVIDER_SITE_OTHER): Payer: 59 | Admitting: Podiatry

## 2023-06-28 DIAGNOSIS — M79675 Pain in left toe(s): Secondary | ICD-10-CM

## 2023-06-28 DIAGNOSIS — M79674 Pain in right toe(s): Secondary | ICD-10-CM

## 2023-06-28 DIAGNOSIS — E1142 Type 2 diabetes mellitus with diabetic polyneuropathy: Secondary | ICD-10-CM

## 2023-06-28 DIAGNOSIS — B351 Tinea unguium: Secondary | ICD-10-CM

## 2023-06-28 NOTE — Progress Notes (Signed)
This patient returns to my office for at risk foot care.  This patient requires this care by a professional since this patient will be at risk due to having diabetes and autism.  This patient is unable to cut nails himself since the patient cannot reach his nails.These nails are painful walking and wearing shoes.  This patient presents for at risk foot care today.  General Appearance  Alert, conversant and in no acute stress.  Vascular  Dorsalis pedis and posterior tibial  pulses are palpable  bilaterally.  Capillary return is within normal limits  bilaterally. Temperature is within normal limits  bilaterally.  Neurologic  Senn-Weinstein monofilament wire test diminished  bilaterally. Muscle power within normal limits bilaterally.  Nails Thick disfigured discolored nails with subungual debris  from hallux to fifth toes bilaterally. No evidence of bacterial infection or drainage bilaterally.  Orthopedic  No limitations of motion  feet .  No crepitus or effusions noted.  No bony pathology or digital deformities noted. Pes planus  B/L.  Skin  normotropic skin with no porokeratosis noted bilaterally.  No signs of infections or ulcers noted.     Onychomycosis  Pain in right toes  Pain in left toes  Consent was obtained for treatment procedures.   Mechanical debridement of nails 1-5  bilaterally performed with a nail nipper.  Filed with dremel without incident. Patient qualifies for diabetic shoes due to DPN, pes planus and HAV.     Return office visit   3 months                   Told patient to return for periodic foot care and evaluation due to potential at risk complications.   Helane Gunther DPM

## 2023-09-27 ENCOUNTER — Ambulatory Visit (INDEPENDENT_AMBULATORY_CARE_PROVIDER_SITE_OTHER): Payer: 59 | Admitting: Podiatry

## 2023-09-27 ENCOUNTER — Encounter: Payer: Self-pay | Admitting: Podiatry

## 2023-09-27 DIAGNOSIS — M79674 Pain in right toe(s): Secondary | ICD-10-CM

## 2023-09-27 DIAGNOSIS — M79675 Pain in left toe(s): Secondary | ICD-10-CM

## 2023-09-27 DIAGNOSIS — B351 Tinea unguium: Secondary | ICD-10-CM

## 2023-09-27 DIAGNOSIS — E1142 Type 2 diabetes mellitus with diabetic polyneuropathy: Secondary | ICD-10-CM

## 2023-09-27 NOTE — Progress Notes (Signed)
 This patient returns to my office for at risk foot care.  This patient requires this care by a professional since this patient will be at risk due to having diabetes and autism.  This patient is unable to cut nails himself since the patient cannot reach his nails.These nails are painful walking and wearing shoes.  This patient presents for at risk foot care today.  General Appearance  Alert, conversant and in no acute stress.  Vascular  Dorsalis pedis and posterior tibial  pulses are palpable  bilaterally.  Capillary return is within normal limits  bilaterally. Temperature is within normal limits  bilaterally.  Neurologic  Senn-Weinstein monofilament wire test diminished  bilaterally. Muscle power within normal limits bilaterally.  Nails Thick disfigured discolored nails with subungual debris  from hallux to fifth toes bilaterally. No evidence of bacterial infection or drainage bilaterally.  Orthopedic  No limitations of motion  feet .  No crepitus or effusions noted.  No bony pathology or digital deformities noted. Pes planus  B/L.  Skin  normotropic skin with no porokeratosis noted bilaterally.  No signs of infections or ulcers noted.     Onychomycosis  Pain in right toes  Pain in left toes  Consent was obtained for treatment procedures.   Mechanical debridement of nails 1-5  bilaterally performed with a nail nipper.  Filed with dremel without incident. Patient qualifies for diabetic shoes due to DPN, pes planus and HAV.     Return office visit   3 months                   Told patient to return for periodic foot care and evaluation due to potential at risk complications.   Helane Gunther DPM

## 2023-12-27 ENCOUNTER — Encounter: Payer: Self-pay | Admitting: Podiatry

## 2023-12-27 ENCOUNTER — Ambulatory Visit (INDEPENDENT_AMBULATORY_CARE_PROVIDER_SITE_OTHER): Admitting: Podiatry

## 2023-12-27 DIAGNOSIS — M79675 Pain in left toe(s): Secondary | ICD-10-CM | POA: Diagnosis not present

## 2023-12-27 DIAGNOSIS — B351 Tinea unguium: Secondary | ICD-10-CM

## 2023-12-27 DIAGNOSIS — M79674 Pain in right toe(s): Secondary | ICD-10-CM

## 2023-12-27 DIAGNOSIS — E1142 Type 2 diabetes mellitus with diabetic polyneuropathy: Secondary | ICD-10-CM | POA: Diagnosis not present

## 2023-12-27 NOTE — Progress Notes (Signed)
 This patient returns to my office for at risk foot care.  This patient requires this care by a professional since this patient will be at risk due to having diabetes and autism.  This patient is unable to cut nails himself since the patient cannot reach his nails.These nails are painful walking and wearing shoes.  This patient presents for at risk foot care today.  General Appearance  Alert, conversant and in no acute stress.  Vascular  Dorsalis pedis and posterior tibial  pulses are palpable  bilaterally.  Capillary return is within normal limits  bilaterally. Temperature is within normal limits  bilaterally.  Neurologic  Senn-Weinstein monofilament wire test diminished  bilaterally. Muscle power within normal limits bilaterally.  Nails Thick disfigured discolored nails with subungual debris  from hallux to fifth toes bilaterally. No evidence of bacterial infection or drainage bilaterally.  Orthopedic  No limitations of motion  feet .  No crepitus or effusions noted.  No bony pathology or digital deformities noted. Pes planus  B/L.  Skin  normotropic skin with no porokeratosis noted bilaterally.  No signs of infections or ulcers noted.     Onychomycosis  Pain in right toes  Pain in left toes  Consent was obtained for treatment procedures.   Mechanical debridement of nails 1-5  bilaterally performed with a nail nipper.  Filed with dremel without incident. Patient qualifies for diabetic shoes due to DPN, pes planus and HAV.     Return office visit   3 months                   Told patient to return for periodic foot care and evaluation due to potential at risk complications.   Helane Gunther DPM

## 2024-03-19 NOTE — Progress Notes (Unsigned)
 New Patient Note  RE: Eric Cummings MRN: [id number] DOB: 04/02/1966 Date of Office Visit: 03/20/2024  Consult requested by: Onita Rush, MD Primary care provider: Onita Rush, MD  Chief Complaint: No chief complaint on file.  History of Present Illness: I had the pleasure of seeing Eric Cummings for initial evaluation at the [facility] on 03/20/2024. He is a 58 y.o. male, who is referred here by Onita Rush, MD for the evaluation of ***.  Discussed the use of AI scribe software for clinical note transcription with the patient, who gave verbal consent to proceed.  History of Present Illness             ***  Assessment and Plan: Eric Cummings is a 58 y.o. male with: ***  Assessment and Plan               No follow-ups on file.  No orders of the defined types were placed in this encounter.  Lab Orders  No laboratory test(s) ordered today    Other allergy screening: Asthma: {Blank single:19197::yes,no} Rhino conjunctivitis: {Blank single:19197::yes,no} Food allergy: {Blank single:19197::yes,no} Medication allergy: {Blank single:19197::yes,no} Hymenoptera allergy: {Blank single:19197::yes,no} Urticaria: {Blank single:19197::yes,no} Eczema:{Blank single:19197::yes,no} History of recurrent infections suggestive of immunodeficency: {Blank single:19197::yes,no}  Diagnostics: Spirometry:  Tracings reviewed. His effort: {Blank single:19197::Good reproducible efforts.,It was hard to get consistent efforts and there is a question as to whether this reflects a maximal maneuver.,Poor effort, data can not be interpreted.} FVC: ***L FEV1: ***L, ***% predicted FEV1/FVC ratio: ***% Interpretation: {Blank single:19197::Spirometry consistent with mild obstructive disease,Spirometry consistent with moderate obstructive disease,Spirometry consistent with severe obstructive disease,Spirometry consistent with  possible restrictive disease,Spirometry consistent with mixed obstructive and restrictive disease,Spirometry uninterpretable due to technique,Spirometry consistent with normal pattern,No overt abnormalities noted given today's efforts}.  Please see scanned spirometry results for details.  Skin Testing: {Blank single:19197::Select foods,Environmental allergy panel,Environmental allergy panel and select foods,Food allergy panel,None,Deferred due to recent antihistamines use}. *** Results discussed with patient/family.   Past Medical History: Patient Active Problem List   Diagnosis Date Noted  . Urinary incontinence 10/07/2021  . Nocturia 05/15/2021  . Encounter for screening for other disorder 04/04/2018  . Diabetic peripheral neuropathy associated with type 2 diabetes mellitus (HCC) 01/08/2016  . Diabetic renal disease (HCC) 01/08/2016  . Adult failure to thrive syndrome 08/16/2015  . Constipation 05/17/2015  . Polyneuropathy 05/17/2015  . Tinea unguium 05/17/2015  . Cardiac arrhythmia 01/25/2015  . Long term (current) use of insulin (HCC) 01/25/2015  . Abnormal gait 09/14/2014  . Type 1 diabetes mellitus without complications (HCC) 09/14/2014  . Cough 11/30/2013  . Syncope and collapse 08/21/2013  . Impacted cerumen 07/29/2012  . Underimmunization status 03/01/2012  . Otalgia 11/27/2011  . Gastro-esophageal reflux disease without esophagitis 06/20/2010  . Allergic rhinitis 02/28/2010  . Dental caries 06/07/2009  . Hyperglycemia due to type 2 diabetes mellitus (HCC) 06/07/2009  . Autistic disorder 05/28/2009  . Dermatitis 05/28/2009  . Essential hypertension 05/28/2009  . Hyperlipidemia 05/28/2009  . Obesity 05/28/2009  . Profound intellectual disabilities 05/28/2009   Past Medical History:  Diagnosis Date  . Diabetes mellitus without complication [facility])    Past Surgical History: No past surgical history on file. Medication List:  Current Outpatient  Medications  Medication Sig Dispense Refill  . ACCU-CHEK AVIVA PLUS test strip 1 each 3 (three) times daily.    . Accu-Chek FastClix Lancets MISC Apply topically.    SABRA amLODipine (NORVASC) 10 MG tablet Take 10 mg by  mouth daily.    . B-D ULTRAFINE III SHORT PEN 31G X 8 MM MISC SMARTSIG:SUB-Q 1-3 Times Daily    . Blood Glucose Calibration (ACCU-CHEK AVIVA) SOLN use to calibrate meter Dx:  E11.65    . Canagliflozin 100 MG TABS Take by mouth.    . empagliflozin (JARDIANCE) 10 MG TABS tablet Take 1 tablet by mouth daily.    SABRA EPINEPHrine (EPIPEN 2-PAK) 0.3 mg/0.3 mL IJ SOAJ injection use as directed for anaphylaxis prn    . fenofibrate 160 MG tablet Take 160 mg by mouth daily.    . fenofibrate 160 MG tablet Take 1 tablet by mouth daily.    . fenofibrate 160 MG tablet TAKE 1 TABLET BY MOUTH EVERY DAY Orally Once a day for 90 days    . gabapentin (NEURONTIN) 100 MG capsule TK 1 C PO IN MORNING AND 3 CAPSULE HS    . glyBURIDE-metformin (GLUCOVANCE) 2.5-500 MG per tablet Take 1 tablet by mouth daily with breakfast.    . HUMALOG 100 UNIT/ML injection Inject into the skin.    . Incontinence Supply Disposable (DEPEND EASY FIT UNDERGARMENTS) MISC 3 per day as directed TID for 90 days    . insulin glargine (LANTUS) 100 UNIT/ML injection Inject into the skin at bedtime.    . insulin lispro (HUMALOG) 100 UNIT/ML injection INJECT UNDER SKIN 0 UNIT(0-150), 3 UNITS(151-200), 5 UNITS(201-300) AND 7 UNITS IF GREATER THAN 301.    . Insulin Syringe-Needle U-100 (INSULIN SYRINGE .5CC/31GX5/16) 31G X 5/16 0.5 ML MISC USE UNDER THE SKIN UP TO THREE TIMES DAILY.    SABRA Loratadine (CLARITIN) 10 MG CAPS one po HS Oral Once a day for 90 days    . metFORMIN (GLUCOPHAGE-XR) 500 MG 24 hr tablet TK 2 TS PO QAM AND 1 T QPM    . mupirocin  ointment (BACTROBAN ) 2 % Apply to left foot once daily until healed. 22 g 1  . NONFORMULARY OR COMPOUNDED ITEM [facility] compound:  Peripheral Neuropathy cream - Bupivacaine 1%,  Doxepin 3%, Gabapentin 6%, Pentoxifylline 3%, Topiramate 1%, dispense 120 grams, apply 1-2 grams to affected area 3-4 times daily, +2refills. 120 each 2  . olmesartan (BENICAR) 20 MG tablet Take 20 mg by mouth daily.    SABRA olmesartan (BENICAR) 20 MG tablet Take 1 tablet by mouth daily.    SABRA omeprazole (PRILOSEC) 20 MG capsule Take 20 mg by mouth daily.    . polyethylene glycol (MIRALAX) 17 g packet 1 packet mixed with 8 ounces of fluid Orally Once a day for 90 days    . polyethylene glycol powder (MIRALAX) 17 GM/SCOOP powder 1 packet mixed with 8 ounces of fluid Orally Once a day for 90 days    . sitaGLIPtin (JANUVIA) 100 MG tablet Take 1 tablet by mouth daily.    . triamcinolone  (KENALOG ) 0.025 % cream APP EXT BID    . triamcinolone  (KENALOG ) 0.025 % ointment Apply 1 application topically 2 (two) times daily. 30 g 0  . triazolam (HALCION) 0.125 MG tablet TAKE 3 TABLETS BY MOUTH PRIOR TO DENTAL PROCEDURE AS DIRECTED BY DENTAL STAFF  0   No current facility-administered medications for this visit.   Allergies: Allergies  Allergen Reactions  . Ace Inhibitors     Other reaction(s): cough Other reaction(s): cough  . Iodine     Other reaction(s): Unknown Other reaction(s): Unknown  . Shellfish Allergy     Other reaction(s): Unknown  . Shellfish Protein-Containing Drug Products     Other reaction(s):  Unknown Other reaction(s): Unknown  . Simvastatin     Other reaction(s): intolerance to Other reaction(s): intolerance to   Social History: Social History   Socioeconomic History  . Marital status: Single    Spouse name: Not on file  . Number of children: Not on file  . Years of education: Not on file  . Highest education level: Not on file  Occupational History  . Not on file  Tobacco Use  . Smoking status: Never  . Smokeless tobacco: Never  Substance and Sexual Activity  . Alcohol use: No  . Drug use: No  . Sexual activity: Not on file  Other Topics Concern  . Not on file   Social History Narrative  . Not on file   Social Drivers of Health   Financial Resource Strain: Not on file  Food Insecurity: Not on file  Transportation Needs: Not on file  Physical Activity: Not on file  Stress: Not on file  Social Connections: Not on file   Lives in a ***. Smoking: *** Occupation: ***  Environmental HistorySurveyor, minerals in the house: Network engineer in the family room: {Blank single:19197::yes,no} Carpet in the bedroom: {Blank single:19197::yes,no} Heating: {Blank single:19197::electric,gas,heat pump} Cooling: {Blank single:19197::central,window,heat pump} Pet: {Blank single:19197::yes ***,no}  Family History: No family history on file. Problem                               Relation Asthma                                   *** Eczema                                *** Food allergy                          *** Allergic rhino conjunctivitis     ***  Review of Systems  Constitutional:  Negative for appetite change, chills, fever and unexpected weight change.  HENT:  Negative for congestion and rhinorrhea.   Eyes:  Negative for itching.  Respiratory:  Negative for cough, chest tightness, shortness of breath and wheezing.   Cardiovascular:  Negative for chest pain.  Gastrointestinal:  Negative for abdominal pain.  Genitourinary:  Negative for difficulty urinating.  Skin:  Negative for rash.  Neurological:  Negative for headaches.    Objective: There were no vitals taken for this visit. There is no height or weight on file to calculate BMI. Physical Exam Vitals and nursing note reviewed.  Constitutional:      Appearance: Normal appearance. He is well-developed.  HENT:     Head: Normocephalic and atraumatic.     Right Ear: Tympanic membrane and external ear normal.     Left Ear: Tympanic membrane and external ear normal.     Nose: Nose normal.     Mouth/Throat:     Mouth: Mucous membranes  are moist.     Pharynx: Oropharynx is clear.  Eyes:     Conjunctiva/sclera: Conjunctivae normal.  Cardiovascular:     Rate and Rhythm: Normal rate and regular rhythm.     Heart sounds: Normal heart sounds. No murmur heard.    No friction rub. No gallop.  Pulmonary:     Effort: Pulmonary effort is  normal.     Breath sounds: Normal breath sounds. No wheezing, rhonchi or rales.  Musculoskeletal:     Cervical back: Neck supple.  Skin:    General: Skin is warm.     Findings: No rash.  Neurological:     Mental Status: He is alert and oriented to person, place, and time.  Psychiatric:        Behavior: Behavior normal.   The plan was reviewed with the patient/family, and all questions/concerned were addressed.  It was my pleasure to see Christine today and participate in his care. Please feel free to contact me with any questions or concerns.  Sincerely,  Orlan Cramp, DO Allergy & Immunology  [facility]  [facility] office: [phone] [facility] office: [phone]

## 2024-03-20 ENCOUNTER — Other Ambulatory Visit: Payer: Self-pay

## 2024-03-20 ENCOUNTER — Encounter: Payer: Self-pay | Admitting: Allergy

## 2024-03-20 ENCOUNTER — Ambulatory Visit: Admitting: Allergy

## 2024-03-20 VITALS — BP 130/82 | HR 81 | Temp 98.0°F | Resp 19 | Ht 70.0 in | Wt 161.4 lb

## 2024-03-20 DIAGNOSIS — L299 Pruritus, unspecified: Secondary | ICD-10-CM

## 2024-03-20 DIAGNOSIS — T7819XA Other adverse food reactions, not elsewhere classified, initial encounter: Secondary | ICD-10-CM

## 2024-03-20 DIAGNOSIS — R21 Rash and other nonspecific skin eruption: Secondary | ICD-10-CM

## 2024-03-20 DIAGNOSIS — Z713 Dietary counseling and surveillance: Secondary | ICD-10-CM

## 2024-03-20 DIAGNOSIS — J3089 Other allergic rhinitis: Secondary | ICD-10-CM

## 2024-03-20 DIAGNOSIS — T7819XD Other adverse food reactions, not elsewhere classified, subsequent encounter: Secondary | ICD-10-CM

## 2024-03-20 DIAGNOSIS — L853 Xerosis cutis: Secondary | ICD-10-CM

## 2024-03-20 MED ORDER — FLUOCINOLONE ACETONIDE BODY 0.01 % EX OIL: TOPICAL_OIL | CUTANEOUS | 2 refills | Status: DC

## 2024-03-20 MED ORDER — CETIRIZINE HCL 10 MG PO TABS: 10.0000 mg | ORAL_TABLET | Freq: Every day | ORAL | 5 refills | Status: DC

## 2024-03-20 NOTE — Patient Instructions (Addendum)
 Skin  I don't think his itching is due to allergies. Keep track of rashes and take pictures. Write down what you had done/eaten during flares.  See below for proper skin care. Use fragrance free and dye free products. No dryer sheets or fabric softener.   Take zyrtec 10mg  1 pill at night. Stop fexofenadine. May use derma-smoothe oil once a day on the whole body. Once rash/itching clears up only use up to twice per week.   Foods Continue to avoid eggs, peanuts, cinnamon, peppers, seafood, soy. If bloodwork is negative will refer to GI next.   Get bloodwork We are ordering labs, so please allow 1-2 weeks for the results to come back. With the newly implemented Cures Act, the labs might be visible to you at the same time that they become visible to me. However, I will not address the results until all of the results are back, so please be patient.  In the meantime, continue recommendations in your patient instructions, including avoidance measures (if applicable), until you hear from me.  Follow up in 1 month or sooner if needed.   Skin care recommendations  Bath time: Always use lukewarm water. AVOID very hot or cold water. Keep bathing time to 5-10 minutes. Do NOT use bubble bath. Use a mild soap and use just enough to wash the dirty areas. Do NOT scrub skin vigorously.  After bathing, pat dry your skin with a towel. Do NOT rub or scrub the skin.  Moisturizers and prescriptions:  ALWAYS apply moisturizers immediately after bathing (within 3 minutes). This helps to lock-in moisture. Use the moisturizer several times a day over the whole body. Good summer moisturizers include: Aveeno, CeraVe, Cetaphil. Good winter moisturizers include: Aquaphor, Vaseline, Cerave, Cetaphil, Eucerin, Vanicream. When using moisturizers along with medications, the moisturizer should be applied about one hour after applying the medication to prevent diluting effect of the medication or moisturize around  where you applied the medications. When not using medications, the moisturizer can be continued twice daily as maintenance.  Laundry and clothing: Avoid laundry products with added color or perfumes. Use unscented hypo-allergenic laundry products such as Tide free, Cheer free & gentle, and All free and clear.  If the skin still seems dry or sensitive, you can try double-rinsing the clothes. Avoid tight or scratchy clothing such as wool. Do not use fabric softeners or dyer sheets.

## 2024-03-22 ENCOUNTER — Ambulatory Visit (INDEPENDENT_AMBULATORY_CARE_PROVIDER_SITE_OTHER): Admitting: Podiatry

## 2024-03-22 DIAGNOSIS — M79671 Pain in right foot: Secondary | ICD-10-CM | POA: Diagnosis not present

## 2024-03-22 DIAGNOSIS — T148XXA Other injury of unspecified body region, initial encounter: Secondary | ICD-10-CM | POA: Diagnosis not present

## 2024-03-22 MED ORDER — IBUPROFEN 800 MG PO TABS: 800.0000 mg | ORAL_TABLET | Freq: Four times a day (QID) | ORAL | 1 refills | Status: DC | PRN

## 2024-03-22 NOTE — Progress Notes (Signed)
 Subjective:  Patient ID: Eric Cummings, male    DOB: 25-Aug-1965,  MRN: [id number]  Chief Complaint  Patient presents with   Foot Pain    58 y.o. male presents with the above complaint.  Patient presents with complaint of right lateral forefoot ecchymosis.  He has history of autism and is unable to articulate exactly her pain is pain.  He is here with his caretaker who states that he has been causing some discomfort to the right foot and has been causing some antalgic gait.  Just want to get it evaluated has not seen anyone else prior to seeing me does not recall any injury to the area.   Review of Systems: Negative except as noted in the HPI. Denies N/V/F/Ch.  Past Medical History:  Diagnosis Date   Diabetes mellitus without complication (HCC)    Eczema    Urticaria     Current Outpatient Medications:    ibuprofen (ADVIL) 800 MG tablet, Take 1 tablet (800 mg total) by mouth every 6 (six) hours as needed., Disp: 60 tablet, Rfl: 1   ACCU-CHEK AVIVA PLUS test strip, 1 each 3 (three) times daily., Disp: , Rfl:    Accu-Chek FastClix Lancets MISC, Apply topically., Disp: , Rfl:    amLODipine (NORVASC) 10 MG tablet, Take 10 mg by mouth daily., Disp: , Rfl:    B-D ULTRAFINE III SHORT PEN 31G X 8 MM MISC, SMARTSIG:SUB-Q 1-3 Times Daily, Disp: , Rfl:    Blood Glucose Calibration (ACCU-CHEK AVIVA) SOLN, use to calibrate meter Dx:  E11.65, Disp: , Rfl:    Canagliflozin 100 MG TABS, Take by mouth., Disp: , Rfl:    cetirizine (ZYRTEC ALLERGY) 10 MG tablet, Take 1 tablet (10 mg total) by mouth at bedtime. For itching, Disp: 30 tablet, Rfl: 5   empagliflozin (JARDIANCE) 10 MG TABS tablet, Take 1 tablet by mouth daily., Disp: , Rfl:    EPINEPHrine (EPIPEN 2-PAK) 0.3 mg/0.3 mL IJ SOAJ injection, use as directed for anaphylaxis prn, Disp: , Rfl:    fenofibrate 160 MG tablet, Take 160 mg by mouth daily., Disp: , Rfl:    fenofibrate 160 MG tablet, Take 1 tablet by mouth daily., Disp: , Rfl:     fenofibrate 160 MG tablet, TAKE 1 TABLET BY MOUTH EVERY DAY Orally Once a day for 90 days, Disp: , Rfl:    Fluocinolone Acetonide Body (DERMA-SMOOTHE/FS BODY) 0.01 % OIL, Apply once a day on the whole body. Once rash/itching clears up only use up to twice per week., Disp: 118 mL, Rfl: 2   gabapentin (NEURONTIN) 100 MG capsule, TK 1 C PO IN MORNING AND 3 CAPSULE HS, Disp: , Rfl:    glyBURIDE-metformin (GLUCOVANCE) 2.5-500 MG per tablet, Take 1 tablet by mouth daily with breakfast., Disp: , Rfl:    HUMALOG 100 UNIT/ML injection, Inject into the skin., Disp: , Rfl:    Incontinence Supply Disposable (DEPEND EASY FIT UNDERGARMENTS) MISC, 3 per day as directed TID for 90 days, Disp: , Rfl:    insulin glargine (LANTUS) 100 UNIT/ML injection, Inject into the skin at bedtime., Disp: , Rfl:    insulin lispro (HUMALOG) 100 UNIT/ML injection, INJECT UNDER SKIN 0 UNIT(0-150), 3 UNITS(151-200), 5 UNITS(201-300) AND 7 UNITS IF GREATER THAN 301., Disp: , Rfl:    Insulin Syringe-Needle U-100 (INSULIN SYRINGE .5CC/31GX5/16) 31G X 5/16 0.5 ML MISC, USE UNDER THE SKIN UP TO THREE TIMES DAILY., Disp: , Rfl:    metFORMIN (GLUCOPHAGE-XR) 500 MG 24 hr tablet, TK 2  TS PO QAM AND 1 T QPM, Disp: , Rfl:    NONFORMULARY OR COMPOUNDED ITEM, [facility] compound:  Peripheral Neuropathy cream - Bupivacaine 1%, Doxepin 3%, Gabapentin 6%, Pentoxifylline 3%, Topiramate 1%, dispense 120 grams, apply 1-2 grams to affected area 3-4 times daily, +2refills., Disp: 120 each, Rfl: 2   olmesartan (BENICAR) 20 MG tablet, Take 20 mg by mouth daily., Disp: , Rfl:    olmesartan (BENICAR) 20 MG tablet, Take 1 tablet by mouth daily., Disp: , Rfl:    omeprazole (PRILOSEC) 20 MG capsule, Take 20 mg by mouth daily., Disp: , Rfl:    polyethylene glycol (MIRALAX) 17 g packet, 1 packet mixed with 8 ounces of fluid Orally Once a day for 90 days, Disp: , Rfl:    polyethylene glycol powder (MIRALAX) 17 GM/SCOOP powder, 1 packet mixed with 8 ounces of  fluid Orally Once a day for 90 days, Disp: , Rfl:    sitaGLIPtin (JANUVIA) 100 MG tablet, Take 1 tablet by mouth daily., Disp: , Rfl:    triazolam (HALCION) 0.125 MG tablet, TAKE 3 TABLETS BY MOUTH PRIOR TO DENTAL PROCEDURE AS DIRECTED BY DENTAL STAFF, Disp: , Rfl: 0  Social History   Tobacco Use  Smoking Status Never  Smokeless Tobacco Never    Allergies  Allergen Reactions   Ace Inhibitors     Other reaction(s): cough Other reaction(s): cough   Iodine     Other reaction(s): Unknown Other reaction(s): Unknown   Shellfish Allergy     Other reaction(s): Unknown   Shellfish Protein-Containing Drug Products     Other reaction(s): Unknown Other reaction(s): Unknown   Simvastatin     Other reaction(s): intolerance to Other reaction(s): intolerance to   Objective:  There were no vitals filed for this visit. There is no height or weight on file to calculate BMI. Constitutional Well developed. Well nourished.  Vascular Dorsalis pedis pulses palpable bilaterally. Posterior tibial pulses palpable bilaterally. Capillary refill normal to all digits.  No cyanosis or clubbing noted. Pedal hair growth normal.  Neurologic Normal speech. Oriented to person, place, and time. Epicritic sensation to light touch grossly present bilaterally.  Dermatologic Nails well groomed and normal in appearance. No open wounds. No skin lesions.  Orthopedic: Mild pain on palpation to the right lateral foot some swelling noted.  Some ecchymosis noted.  No redness no open wounds noted.  Mild discomfort to the right lateral foot   Radiographs: None Assessment:   1. Contusion of soft tissue    Plan:  Patient was evaluated and treated and all questions answered.  Right lateral soft tissue contusion - All questions and concerns were discussed with the patient in extensive detail given the presence of contusion patient would benefit from cam boot immobilization patient agrees with proceed with cam boot  immobilization. Cam boot was dispensed-  No follow-ups on file.

## 2024-03-25 LAB — F098-IGE GLIADIN, WHEAT 607953

## 2024-03-27 ENCOUNTER — Ambulatory Visit: Admitting: Podiatry

## 2024-03-27 ENCOUNTER — Encounter: Payer: Self-pay | Admitting: Podiatry

## 2024-03-27 DIAGNOSIS — E1142 Type 2 diabetes mellitus with diabetic polyneuropathy: Secondary | ICD-10-CM | POA: Diagnosis not present

## 2024-03-27 DIAGNOSIS — T148XXA Other injury of unspecified body region, initial encounter: Secondary | ICD-10-CM | POA: Diagnosis not present

## 2024-03-27 DIAGNOSIS — M79674 Pain in right toe(s): Secondary | ICD-10-CM | POA: Diagnosis not present

## 2024-03-27 DIAGNOSIS — B351 Tinea unguium: Secondary | ICD-10-CM | POA: Diagnosis not present

## 2024-03-27 DIAGNOSIS — M79675 Pain in left toe(s): Secondary | ICD-10-CM | POA: Diagnosis not present

## 2024-03-27 NOTE — Progress Notes (Signed)
 This patient returns to my office for at risk foot care.  This patient requires this care by a professional since this patient will be at risk due to having diabetes and autism.  This patient is unable to cut nails himself since the patient cannot reach his nails.These nails are painful walking and wearing shoes.  This patient presents for at risk foot care today.  General Appearance  Alert, conversant and in no acute stress.  Vascular  Dorsalis pedis and posterior tibial  pulses are palpable  bilaterally.  Capillary return is within normal limits  bilaterally. Temperature is within normal limits  bilaterally.  Neurologic  Senn-Weinstein monofilament wire test diminished  bilaterally. Muscle power within normal limits bilaterally.  Nails Thick disfigured discolored nails with subungual debris  from hallux to fifth toes bilaterally. No evidence of bacterial infection or drainage bilaterally.  Orthopedic  No limitations of motion  feet .  No crepitus or effusions noted.  No bony pathology or digital deformities noted. Pes planus  B/L.  Skin  normotropic skin with no porokeratosis noted bilaterally.  No signs of infections or ulcers noted.     Onychomycosis  Pain in right toes  Pain in left toes  Consent was obtained for treatment procedures.   Mechanical debridement of nails 1-5  bilaterally performed with a nail nipper.  Filed with dremel without incident. Patient  given surgical shoe for his soft tissue contusion in which he is benig treated by Dr.  Tobie.   Return office visit   3 months                   Told patient to return for periodic foot care and evaluation due to potential at risk complications.   Cordella Bold DPM

## 2024-03-28 LAB — F098-IGE GLIADIN, WHEAT 607953: F098-IgE Gliadin, Wheat: <0.1 kU/L

## 2024-03-28 LAB — CBC WITH DIFFERENTIAL/PLATELET
Basophils Absolute: 0 x10E3/uL (ref 0.0–0.2)
Basos: 1 %
EOS (ABSOLUTE): 0.1 x10E3/uL (ref 0.0–0.4)
Eos: 1 %
Hematocrit: 49.9 % (ref 37.5–51.0)
Hemoglobin: 15.9 g/dL (ref 13.0–17.7)
Immature Grans (Abs): 0 x10E3/uL (ref 0.0–0.1)
Immature Granulocytes: 0 %
Lymphocytes Absolute: 1.4 x10E3/uL (ref 0.7–3.1)
Lymphs: 17 %
MCH: 28.2 pg (ref 26.6–33.0)
MCHC: 31.9 g/dL (ref 31.5–35.7)
MCV: 89 fL (ref 79–97)
Monocytes Absolute: 0.6 x10E3/uL (ref 0.1–0.9)
Monocytes: 7 %
Neutrophils Absolute: 6.1 x10E3/uL (ref 1.4–7.0)
Neutrophils: 74 %
Platelets: 309 x10E3/uL (ref 150–450)
RBC: 5.63 x10E6/uL (ref 4.14–5.80)
RDW: 12.5 % (ref 11.6–15.4)
WBC: 8.3 x10E3/uL (ref 3.4–10.8)

## 2024-03-28 LAB — PANEL 604726
Cor A 1 IgE: <0.1 kU/L
Cor A 14 IgE: <0.1 kU/L
Cor A 8 IgE: <0.1 kU/L
Cor A 9 IgE: <0.1 kU/L

## 2024-03-28 LAB — ALLERGY PROFILE, FOOD
Allergen Salmon IgE: 0.31 kU/L — AB
Codfish IgE: 1.5 kU/L — AB
F001-IgE Egg White: <0.1 kU/L
F002-IgE Milk: 0.12 kU/L — AB
F004-IgE Wheat: 0.19 kU/L — AB
F010-IgE Sesame Seed: 0.2 kU/L — AB
F017-IgE Hazelnut (Filbert): 0.54 kU/L — AB
F018-IgE Brazil Nut: <0.1 kU/L
F020-IgE Almond: 0.1 kU/L — AB
F202-IgE Cashew Nut: <0.1 kU/L
F256-IgE Walnut: <0.1 kU/L
F416-IgE Tri a 19(w-5 gliadin): <0.1 kU/L
Peanut, IgE: 0.17 kU/L — AB
Scallop IgE: 25.3 kU/L — AB
Shrimp IgE: 84.9 kU/L — AB
Soybean IgE: <0.1 kU/L
Tuna: 0.81 kU/L — AB

## 2024-03-28 LAB — COMPREHENSIVE METABOLIC PANEL WITH GFR
ALT: 31 IU/L (ref 0–44)
AST: 23 IU/L (ref 0–40)
Albumin: 4.7 g/dL (ref 3.8–4.9)
Alkaline Phosphatase: 91 IU/L (ref 47–123)
BUN/Creatinine Ratio: 28 — ABNORMAL HIGH (ref 9–20)
BUN: 36 mg/dL — ABNORMAL HIGH (ref 6–24)
Bilirubin Total: 0.5 mg/dL (ref 0.0–1.2)
CO2: 22 mmol/L (ref 20–29)
Calcium: 9.9 mg/dL (ref 8.7–10.2)
Chloride: 97 mmol/L (ref 96–106)
Creatinine, Ser: 1.3 mg/dL — ABNORMAL HIGH (ref 0.76–1.27)
Globulin, Total: 2.9 g/dL (ref 1.5–4.5)
Glucose: 318 mg/dL — ABNORMAL HIGH (ref 70–99)
Potassium: 4.7 mmol/L (ref 3.5–5.2)
Sodium: 136 mmol/L (ref 134–144)
Total Protein: 7.6 g/dL (ref 6.0–8.5)
eGFR: 64 mL/min/1.73 (ref >59)

## 2024-03-28 LAB — ALLERGENS W/TOTAL IGE AREA 2
Alternaria Alternata IgE: <0.1 kU/L
Aspergillus Fumigatus IgE: <0.1 kU/L
Bermuda Grass IgE: 0.18 kU/L — AB
Cat Dander IgE: <0.1 kU/L
Cedar, Mountain IgE: 0.39 kU/L — AB
Cladosporium Herbarum IgE: <0.1 kU/L
Cockroach, German IgE: 25.1 kU/L — AB
Common Silver Birch IgE: 0.12 kU/L — AB
Cottonwood IgE: 0.34 kU/L — AB
D Farinae IgE: >100 kU/L — AB
D Pteronyssinus IgE: >100 kU/L — AB
Dog Dander IgE: 0.42 kU/L — AB
Elm, American IgE: 0.28 kU/L — AB
Johnson Grass IgE: 0.3 kU/L — AB
Maple/Box Elder IgE: 0.22 kU/L — AB
Mouse Urine IgE: <0.1 kU/L
Oak, White IgE: 0.17 kU/L — AB
Pecan, Hickory IgE: 0.23 kU/L — AB
Penicillium Chrysogen IgE: <0.1 kU/L
Pigweed, Rough IgE: 0.12 kU/L — AB
Ragweed, Short IgE: 0.84 kU/L — AB
Sheep Sorrel IgE Qn: 0.22 kU/L — AB
Timothy Grass IgE: 6.84 kU/L — AB
White Mulberry IgE: 0.61 kU/L — AB

## 2024-03-28 LAB — PEANUT COMPONENTS
F352-IgE Ara h 8: <0.1 kU/L
F422-IgE Ara h 1: <0.1 kU/L
F423-IgE Ara h 2: <0.1 kU/L
F424-IgE Ara h 3: <0.1 kU/L
F427-IgE Ara h 9: <0.1 kU/L
F447-IgE Ara h 6: <0.1 kU/L

## 2024-03-28 LAB — ALPHA-GAL PANEL
Allergen Lamb IgE: 0.13 kU/L — AB
Beef IgE: 0.24 kU/L — AB
IgE (Immunoglobulin E), Serum: 917 [IU]/mL — ABNORMAL HIGH (ref 6–495)
O215-IgE Alpha-Gal: <0.1 kU/L
Pork IgE: <0.1 kU/L

## 2024-03-28 LAB — F433-IGE TRI A 14 (NSLTP): F433-IgE Tri a 14 (nsLTP): <0.1 kU/L

## 2024-03-28 LAB — G212-IGE PHL P 12 (PROFILIN): G212-IgE Phl p 12 (Profilin): <0.1 kU/L

## 2024-03-28 LAB — CHRONIC URTICARIA PD-BAT: Pooled Donor- BAT CU: 5.2 % (ref 0.00–10.60)

## 2024-03-28 LAB — ALLERGEN, CINNAMON, RF220: Allergen Cinnamon IgE: <0.1 kU/L

## 2024-03-28 LAB — TRYPTASE: Tryptase: 12.6 ug/L (ref 2.2–13.2)

## 2024-03-28 LAB — O214-IGE CCD DETERMINANTS: O214-IgE CCD Determinants: <0.1 kU/L

## 2024-03-28 LAB — THYROID CASCADE PROFILE: TSH: 1.42 u[IU]/mL (ref 0.450–4.500)

## 2024-03-28 LAB — ANTINUCLEAR ANTIBODIES, IFA: ANA Titer 1: NEGATIVE

## 2024-03-28 LAB — F263-IGE GREEN BELL PEPPER: Allergen Green Bell Pepper IgE: <0.1 kU/L

## 2024-03-28 LAB — F449-IGE SES I 1 607742: F449-IgE Ses i 1: <0.1 kU/L

## 2024-03-28 LAB — ALLERGEN COMPONENT COMMENTS

## 2024-03-31 ENCOUNTER — Ambulatory Visit: Payer: Self-pay | Admitting: Allergy

## 2024-04-16 NOTE — Progress Notes (Unsigned)
 Follow Up Note  RE: Eric Cummings MRN: [id number] DOB: 1965/10/03 Date of Office Visit: 04/17/2024  Referring provider: Onita Rush, MD Primary care provider: Onita Rush, MD  Chief Complaint: Pruritus (Still having itching. ) and Rash (Comes and goes above his eyes. )  History of Present Illness: I had the pleasure of seeing Eric Cummings for a follow up visit at the [facility] on 04/17/2024. He is a 58 y.o. male, who is being followed for pruritus, possible food intolerances. His previous allergy office visit was on 03/20/2024 with Dr. Luke. Today is a regular follow up visit. He is accompanied today by his sister who provided/contributed to the history.   Discussed the use of AI scribe software for clinical note transcription with the patient, who gave verbal consent to proceed.    He has been experiencing ongoing issues of itching and rash. His sister is trying to determine if these symptoms are related to food allergies or environmental factors. He had a reaction to shellfish over twenty years ago that required hospitalization and oxygen therapy. He is currently taking Zyrtec, one pill at night, which helps somewhat with the itching.  His sister has been managing his diet to identify potential food triggers. He has attempted to go dairy-free, but many dairy alternatives contain sea salt, which he reacts to negatively. He has been consuming oatmeal without issues, but milk and cheese may still cause itching. He avoids seafood, eggs, peanuts, cinnamon, peppers, and soy. He has a history of gagging with peanuts, thought to be due to texture rather than an allergic reaction.  He has significant environmental allergies, including dust mites, dogs, grass, and cockroaches. He has a development worker, international aid at home, which may contribute to his symptoms. His sister mentions that he has changed his bedding to a hospital bed due to urinary incontinence issues.  He has sensory issues,  particularly with running water, which can lead to behavioral episodes. His sister tries to apply lotion to his skin. He has a rash above his eyes that comes and goes. He has been using derma smoothie oil, which helps alleviate some of the dryness and itching.  He had a significant meltdown at a doctor's office recently, possibly due to discomfort from wearing a boot.      2025 labs: Blood count, kidney function, liver function, electrolytes, thyroid, autoimmune screener, inflammation markers, chronic urticaria index (checks for autoantibodies that trigger mast cells), tryptase (checks for mast cell issues) were all normal which is great.    Environmental panel was positive to dust mites, dog, grass, cockroach and borderline to some tree, ragweed and weed pollen. See below for environmental control measures. This can definitely make him itchy.   Food panel Positive to shellfish, fish.  Borderline to milk, sesame, peanut, hazelnut, wheat. Other foods were all negative.    Monitor symptoms after he eats milk, sesame and wheat - okay to still eat.   Continue to avoid eggs, peanuts, cinnamon, peppers, seafood, soy.    His sugar numbers were high - please follow up with PCP regarding that.    Continue with zyrtec daily.   Keep appointment in November to discuss further.  Assessment and Plan: Eric Cummings is a 58 y.o. male with: Pruritus with excoriation and xerosis in the setting of autism spectrum disorder Past history - Chronic pruritus with excoriation and xerosis, likely multifactorial due to autism-related sensory behaviors, diabetes-related neuropathy, and dry skin. Allergy less likely. Interim history - slight improvement  with zyrtec and derma-smoothe oil. No changes with eliminating wheat and dairy from diet. Reviewed 2025 labs. Unclear if environmental allergies may be also contributing to symptoms vs xerosis vs behavioral. Discussed using the least amount of medications to control  symptoms due to his other medical issues.  Keep track of rashes and take pictures. Write down what you had done/eaten during flares.  Continue proper skin care. Take zyrtec 10mg  1 pill twice a day as needed.  May use derma-smoothe oil once a day on the whole body. Once rash/itching clears up only use up to twice per week.  Consider biologics to help with the pruritus if work up is unremarkable and symptoms persistent.   Other adverse food reactions, not elsewhere classified, subsequent encounter Past history - Gagging and difficulty breathing with certain foods, possibly related to reflux or swallowing difficulties rather than true IgE mediated food allergies.  Interim history - 2025 labs positive to shellfish, fish. Borderline to milk, sesame, peanut, hazelnut, wheat. Monitor symptoms after he eats milk, sesame and wheat - okay to still eat. Continue to avoid SEAFOOD. Avoid eggs, peanuts, cinnamon, peppers, soy due to the gagging. I have prescribed epinephrine injectable device and demonstrated proper use. For mild symptoms you can take over the counter antihistamines (zyrtec 10mg  to 20mg ) and monitor symptoms closely.  If symptoms worsen or if you have severe symptoms including breathing issues, throat closure, significant swelling, whole body hives, severe diarrhea and vomiting, lightheadedness then use epinephrine and seek immediate medical care afterwards. Emergency action plan given.  Seasonal and perennial allergic rhinitis 2025 labs positive to dust mites, dog, grass, cockroach and borderline to some tree, ragweed and weed pollen. See below for environmental control measures. Take zyrtec 10mg  1 pill twice a day as needed.   Return in about 4 months (around 08/15/2024).  Meds ordered this encounter  Medications   EPINEPHrine 0.3 mg/0.3 mL IJ SOAJ injection    Sig: Inject 0.3 mg into the muscle as needed for anaphylaxis.    Dispense:  2 each    Refill:  1    May dispense  generic/Mylan/Teva brand.   Fluocinolone Acetonide Body (DERMA-SMOOTHE/FS BODY) 0.01 % OIL    Sig: Apply once a day on the whole body. Once rash/itching clears up only use up to twice per week.    Dispense:  118 mL    Refill:  3   cetirizine (ZYRTEC ALLERGY) 10 MG tablet    Sig: Take 1 tablet (10 mg total) by mouth 2 (two) times daily as needed (itching, allergies).    Dispense:  60 tablet    Refill:  5   Lab Orders  No laboratory test(s) ordered today    Diagnostics: None.    Medication List:  Current Outpatient Medications  Medication Sig Dispense Refill   ACCU-CHEK AVIVA PLUS test strip 1 each 3 (three) times daily.     Accu-Chek FastClix Lancets MISC Apply topically.     amLODipine (NORVASC) 10 MG tablet Take 10 mg by mouth daily.     B-D ULTRAFINE III SHORT PEN 31G X 8 MM MISC SMARTSIG:SUB-Q 1-3 Times Daily     Blood Glucose Calibration (ACCU-CHEK AVIVA) SOLN use to calibrate meter Dx:  E11.65     Canagliflozin 100 MG TABS Take by mouth.     cetirizine (ZYRTEC ALLERGY) 10 MG tablet Take 1 tablet (10 mg total) by mouth 2 (two) times daily as needed (itching, allergies). 60 tablet 5   empagliflozin (JARDIANCE) 10 MG TABS  tablet Take 1 tablet by mouth daily.     EPINEPHrine 0.3 mg/0.3 mL IJ SOAJ injection Inject 0.3 mg into the muscle as needed for anaphylaxis. 2 each 1   fenofibrate 160 MG tablet Take 160 mg by mouth daily.     fenofibrate 160 MG tablet Take 1 tablet by mouth daily.     fenofibrate 160 MG tablet TAKE 1 TABLET BY MOUTH EVERY DAY Orally Once a day for 90 days     gabapentin (NEURONTIN) 100 MG capsule TK 1 C PO IN MORNING AND 3 CAPSULE HS     glyBURIDE-metformin (GLUCOVANCE) 2.5-500 MG per tablet Take 1 tablet by mouth daily with breakfast.     HUMALOG 100 UNIT/ML injection Inject into the skin.     ibuprofen (ADVIL) 800 MG tablet Take 1 tablet (800 mg total) by mouth every 6 (six) hours as needed. 60 tablet 1   Incontinence Supply Disposable (DEPEND EASY FIT  UNDERGARMENTS) MISC 3 per day as directed TID for 90 days     insulin glargine (LANTUS) 100 UNIT/ML injection Inject into the skin at bedtime.     insulin lispro (HUMALOG) 100 UNIT/ML injection INJECT UNDER SKIN 0 UNIT(0-150), 3 UNITS(151-200), 5 UNITS(201-300) AND 7 UNITS IF GREATER THAN 301.     Insulin Syringe-Needle U-100 (INSULIN SYRINGE .5CC/31GX5/16) 31G X 5/16 0.5 ML MISC USE UNDER THE SKIN UP TO THREE TIMES DAILY.     metFORMIN (GLUCOPHAGE-XR) 500 MG 24 hr tablet TK 2 TS PO QAM AND 1 T QPM     NONFORMULARY OR COMPOUNDED ITEM [facility] compound:  Peripheral Neuropathy cream - Bupivacaine 1%, Doxepin 3%, Gabapentin 6%, Pentoxifylline 3%, Topiramate 1%, dispense 120 grams, apply 1-2 grams to affected area 3-4 times daily, +2refills. 120 each 2   olmesartan (BENICAR) 20 MG tablet Take 20 mg by mouth daily.     olmesartan (BENICAR) 20 MG tablet Take 1 tablet by mouth daily.     omeprazole (PRILOSEC) 20 MG capsule Take 20 mg by mouth daily.     polyethylene glycol (MIRALAX) 17 g packet 1 packet mixed with 8 ounces of fluid Orally Once a day for 90 days     polyethylene glycol powder (MIRALAX) 17 GM/SCOOP powder 1 packet mixed with 8 ounces of fluid Orally Once a day for 90 days     sitaGLIPtin (JANUVIA) 100 MG tablet Take 1 tablet by mouth daily.     triazolam (HALCION) 0.125 MG tablet TAKE 3 TABLETS BY MOUTH PRIOR TO DENTAL PROCEDURE AS DIRECTED BY DENTAL STAFF  0   Fluocinolone Acetonide Body (DERMA-SMOOTHE/FS BODY) 0.01 % OIL Apply once a day on the whole body. Once rash/itching clears up only use up to twice per week. 118 mL 3   No current facility-administered medications for this visit.   Allergies: Allergies  Allergen Reactions   Ace Inhibitors     Other reaction(s): cough Other reaction(s): cough   Iodine     Other reaction(s): Unknown Other reaction(s): Unknown   Shellfish Allergy     Other reaction(s): Unknown   Shellfish Protein-Containing Drug Products     Other  reaction(s): Unknown Other reaction(s): Unknown   Simvastatin     Other reaction(s): intolerance to Other reaction(s): intolerance to   I reviewed his past medical history, social history, family history, and environmental history and no significant changes have been reported from his previous visit.  Review of Systems  Constitutional:  Negative for appetite change, chills, fever and unexpected weight change.  HENT:  Negative for congestion and rhinorrhea.   Eyes:  Negative for itching.  Respiratory:  Negative for cough, chest tightness, shortness of breath and wheezing.   Cardiovascular:  Negative for chest pain.  Gastrointestinal:  Negative for abdominal pain.  Genitourinary:  Negative for difficulty urinating.  Skin:  Positive for rash.       pruritus  Allergic/Immunologic: Positive for environmental allergies and food allergies.  Neurological:  Negative for headaches.    Objective: BP 116/80   Pulse 69   Temp 97.9 F (36.6 C)   Resp 19   SpO2 99%  There is no height or weight on file to calculate BMI. Physical Exam Vitals and nursing note reviewed.  Constitutional:      Appearance: He is well-developed.  HENT:     Head: Normocephalic and atraumatic.     Right Ear: Tympanic membrane and external ear normal.     Left Ear: Tympanic membrane and external ear normal.     Nose: Nose normal.     Mouth/Throat:     Mouth: Mucous membranes are moist.     Pharynx: Oropharynx is clear.  Eyes:     Conjunctiva/sclera: Conjunctivae normal.  Cardiovascular:     Rate and Rhythm: Normal rate and regular rhythm.     Heart sounds: Normal heart sounds. No murmur heard.    No friction rub. No gallop.  Pulmonary:     Effort: Pulmonary effort is normal.     Breath sounds: Normal breath sounds. No wheezing, rhonchi or rales.  Musculoskeletal:     Cervical back: Neck supple.  Skin:    General: Skin is warm and dry.     Findings: Rash present.     Comments: Dry skin throughout with  excoriation marks on the neck, and upper extremities b/l. Less excoriation marks today than last visit.   Neurological:     Mental Status: He is alert.    Previous notes and tests were reviewed. The plan was reviewed with the patient/family, and all questions/concerned were addressed.  It was my pleasure to see Ronnell today and participate in his care. Please feel free to contact me with any questions or concerns.  Sincerely,  Orlan Cramp, DO Allergy & Immunology  [facility]  [facility] office: [phone] [facility] office: [phone]

## 2024-04-17 ENCOUNTER — Encounter: Payer: Self-pay | Admitting: Allergy

## 2024-04-17 ENCOUNTER — Other Ambulatory Visit: Payer: Self-pay

## 2024-04-17 ENCOUNTER — Ambulatory Visit (INDEPENDENT_AMBULATORY_CARE_PROVIDER_SITE_OTHER): Admitting: Allergy

## 2024-04-17 VITALS — BP 116/80 | HR 69 | Temp 97.9°F | Resp 19

## 2024-04-17 DIAGNOSIS — J302 Other seasonal allergic rhinitis: Secondary | ICD-10-CM

## 2024-04-17 DIAGNOSIS — T7819XD Other adverse food reactions, not elsewhere classified, subsequent encounter: Secondary | ICD-10-CM

## 2024-04-17 DIAGNOSIS — L299 Pruritus, unspecified: Secondary | ICD-10-CM | POA: Diagnosis not present

## 2024-04-17 DIAGNOSIS — J3089 Other allergic rhinitis: Secondary | ICD-10-CM | POA: Diagnosis not present

## 2024-04-17 DIAGNOSIS — R21 Rash and other nonspecific skin eruption: Secondary | ICD-10-CM | POA: Diagnosis not present

## 2024-04-17 MED ORDER — FLUOCINOLONE ACETONIDE BODY 0.01 % EX OIL: TOPICAL_OIL | CUTANEOUS | 3 refills | Status: DC

## 2024-04-17 MED ORDER — CETIRIZINE HCL 10 MG PO TABS: 10.0000 mg | ORAL_TABLET | Freq: Two times a day (BID) | ORAL | 5 refills | Status: AC | PRN

## 2024-04-17 MED ORDER — EPINEPHRINE 0.3 MG/0.3ML IJ SOAJ: 0.3000 mg | INTRAMUSCULAR | 1 refills | Status: AC | PRN

## 2024-04-17 NOTE — Patient Instructions (Addendum)
 Skin  Keep track of rashes and take pictures. Write down what you had done/eaten during flares.  Continue proper skin care. Take zyrtec 10mg  1 pill twice a day as needed.  May use derma-smoothe oil once a day on the whole body. Once rash/itching clears up only use up to twice per week.   Foods 2025 labs positive to shellfish, fish. Borderline to milk, sesame, peanut, hazelnut, wheat. Monitor symptoms after he eats milk, sesame and wheat - okay to still eat. Continue to avoid SEAFOOD. Avoid eggs, peanuts, cinnamon, peppers, soy due to the gagging. I have prescribed epinephrine injectable device and demonstrated proper use. For mild symptoms you can take over the counter antihistamines (zyrtec 10mg  to 20mg ) and monitor symptoms closely.  If symptoms worsen or if you have severe symptoms including breathing issues, throat closure, significant swelling, whole body hives, severe diarrhea and vomiting, lightheadedness then use epinephrine and seek immediate medical care afterwards. Emergency action plan given.  Environmental allergies 2025 labs positive to dust mites, dog, grass, cockroach and borderline to some tree, ragweed and weed pollen. See below for environmental control measures. Take zyrtec 10mg  1 pill twice a day as needed.   Follow up in 4 month or sooner if needed.   Control of House Dust Mite Allergen Dust mite allergens are a common trigger of allergy and asthma symptoms. While they can be found throughout the house, these microscopic creatures thrive in warm, humid environments such as bedding, upholstered furniture and carpeting. Because so much time is spent in the bedroom, it is essential to reduce mite levels there.  Encase pillows, mattresses, and box springs in special allergen-proof fabric covers or airtight, zippered plastic covers.  Bedding should be washed weekly in hot water (130 F) and dried in a hot dryer. Allergen-proof covers are available for comforters and  pillows that can't be regularly washed.  Wash the allergy-proof covers every few months. Minimize clutter in the bedroom. Keep pets out of the bedroom.  Keep humidity less than 50% by using a dehumidifier or air conditioning. You can buy a humidity measuring device called a hygrometer to monitor this.  If possible, replace carpets with hardwood, linoleum, or washable area rugs. If that's not possible, vacuum frequently with a vacuum that has a HEPA filter. Remove all upholstered furniture and non-washable window drapes from the bedroom. Remove all non-washable stuffed toys from the bedroom.  Wash stuffed toys weekly.  Pet Allergen Avoidance: Contrary to popular opinion, there are no "hypoallergenic" breeds of dogs or cats. That is because people are not allergic to an animal's hair, but to an allergen found in the animal's saliva, dander (dead skin flakes) or urine. Pet allergy symptoms typically occur within minutes. For some people, symptoms can build up and become most severe 8 to 12 hours after contact with the animal. People with severe allergies can experience reactions in public places if dander has been transported on the pet owners' clothing. Keeping an animal outdoors is only a partial solution, since homes with pets in the yard still have higher concentrations of animal allergens. Before getting a pet, ask your allergist to determine if you are allergic to animals. If your pet is already considered part of your family, try to minimize contact and keep the pet out of the bedroom and other rooms where you spend a great deal of time. As with dust mites, vacuum carpets often or replace carpet with a hardwood floor, tile or linoleum. High-efficiency particulate air (HEPA) cleaners can  reduce allergen levels over time. While dander and saliva are the source of cat and dog allergens, urine is the source of allergens from rabbits, hamsters, mice and guinea pigs; so ask a non-allergic family member to  clean the animal's cage. If you have a pet allergy, talk to your allergist about the potential for allergy immunotherapy (allergy shots). This strategy can often provide long-term relief. Reducing Pollen Exposure Pollen seasons: trees (spring), grass (summer) and ragweed/weeds (fall). Keep windows closed in your home and car to lower pollen exposure.  Install air conditioning in the bedroom and throughout the house if possible.  Avoid going out in dry windy days - especially early morning. Pollen counts are highest between 5 - 10 AM and on dry, hot and windy days.  Save outside activities for late afternoon or after a heavy rain, when pollen levels are lower.  Avoid mowing of grass if you have grass pollen allergy. Be aware that pollen can also be transported indoors on people and pets.  Dry your clothes in an automatic dryer rather than hanging them outside where they might collect pollen.  Rinse hair and eyes before bedtime. Cockroach Allergen Avoidance Cockroaches are often found in the homes of densely populated urban areas, schools or commercial buildings, but these creatures can lurk almost anywhere. This does not mean that you have a dirty house or living area. Block all areas where roaches can enter the home. This includes crevices, wall cracks and windows.  Cockroaches need water to survive, so fix and seal all leaky faucets and pipes. Have an exterminator go through the house when your family and pets are gone to eliminate any remaining roaches. Keep food in lidded containers and put pet food dishes away after your pets are done eating. Vacuum and sweep the floor after meals, and take out garbage and recyclables. Use lidded garbage containers in the kitchen. Wash dishes immediately after use and clean under stoves, refrigerators or toasters where crumbs can accumulate. Wipe off the stove and other kitchen surfaces and cupboards regularly.   Skin care recommendations  Bath  time: Always use lukewarm water. AVOID very hot or cold water. Keep bathing time to 5-10 minutes. Do NOT use bubble bath. Use a mild soap and use just enough to wash the dirty areas. Do NOT scrub skin vigorously.  After bathing, pat dry your skin with a towel. Do NOT rub or scrub the skin.  Moisturizers and prescriptions:  ALWAYS apply moisturizers immediately after bathing (within 3 minutes). This helps to lock-in moisture. Use the moisturizer several times a day over the whole body. Good summer moisturizers include: Aveeno, CeraVe, Cetaphil. Good winter moisturizers include: Aquaphor, Vaseline, Cerave, Cetaphil, Eucerin, Vanicream. When using moisturizers along with medications, the moisturizer should be applied about one hour after applying the medication to prevent diluting effect of the medication or moisturize around where you applied the medications. When not using medications, the moisturizer can be continued twice daily as maintenance.  Laundry and clothing: Avoid laundry products with added color or perfumes. Use unscented hypo-allergenic laundry products such as Tide free, Cheer free & gentle, and All free and clear.  If the skin still seems dry or sensitive, you can try double-rinsing the clothes. Avoid tight or scratchy clothing such as wool. Do not use fabric softeners or dyer sheets.

## 2024-06-14 ENCOUNTER — Emergency Department (HOSPITAL_COMMUNITY)

## 2024-06-14 ENCOUNTER — Inpatient Hospital Stay (HOSPITAL_COMMUNITY)
Admission: EM | Admit: 2024-06-14 | Discharge: 2024-06-19 | DRG: 065 | Disposition: A | Discharge status: Skilled Nursing Facility | Attending: Internal Medicine | Admitting: Internal Medicine

## 2024-06-14 ENCOUNTER — Other Ambulatory Visit: Payer: Self-pay

## 2024-06-14 DIAGNOSIS — Z91013 Allergy to seafood: Secondary | ICD-10-CM

## 2024-06-14 DIAGNOSIS — R29709 NIHSS score 9: Secondary | ICD-10-CM | POA: Diagnosis present

## 2024-06-14 DIAGNOSIS — I4891 Unspecified atrial fibrillation: Secondary | ICD-10-CM | POA: Diagnosis present

## 2024-06-14 DIAGNOSIS — I10 Essential (primary) hypertension: Secondary | ICD-10-CM | POA: Diagnosis present

## 2024-06-14 DIAGNOSIS — E785 Hyperlipidemia, unspecified: Secondary | ICD-10-CM | POA: Diagnosis present

## 2024-06-14 DIAGNOSIS — F39 Unspecified mood [affective] disorder: Secondary | ICD-10-CM | POA: Diagnosis present

## 2024-06-14 DIAGNOSIS — I480 Paroxysmal atrial fibrillation: Secondary | ICD-10-CM | POA: Diagnosis present

## 2024-06-14 DIAGNOSIS — E1165 Type 2 diabetes mellitus with hyperglycemia: Secondary | ICD-10-CM | POA: Diagnosis present

## 2024-06-14 DIAGNOSIS — Z79899 Other long term (current) drug therapy: Secondary | ICD-10-CM

## 2024-06-14 DIAGNOSIS — I63422 Cerebral infarction due to embolism of left anterior cerebral artery: Principal | ICD-10-CM | POA: Diagnosis present

## 2024-06-14 DIAGNOSIS — Z888 Allergy status to other drugs, medicaments and biological substances status: Secondary | ICD-10-CM

## 2024-06-14 DIAGNOSIS — Z794 Long term (current) use of insulin: Secondary | ICD-10-CM

## 2024-06-14 DIAGNOSIS — R569 Unspecified convulsions: Secondary | ICD-10-CM

## 2024-06-14 DIAGNOSIS — Z751 Person awaiting admission to adequate facility elsewhere: Secondary | ICD-10-CM

## 2024-06-14 DIAGNOSIS — K219 Gastro-esophageal reflux disease without esophagitis: Secondary | ICD-10-CM | POA: Diagnosis present

## 2024-06-14 DIAGNOSIS — E1142 Type 2 diabetes mellitus with diabetic polyneuropathy: Secondary | ICD-10-CM | POA: Diagnosis present

## 2024-06-14 DIAGNOSIS — Z7901 Long term (current) use of anticoagulants: Secondary | ICD-10-CM

## 2024-06-14 DIAGNOSIS — F840 Autistic disorder: Secondary | ICD-10-CM | POA: Diagnosis present

## 2024-06-14 DIAGNOSIS — R29898 Other symptoms and signs involving the musculoskeletal system: Principal | ICD-10-CM

## 2024-06-14 DIAGNOSIS — G8191 Hemiplegia, unspecified affecting right dominant side: Secondary | ICD-10-CM | POA: Diagnosis present

## 2024-06-14 DIAGNOSIS — R29818 Other symptoms and signs involving the nervous system: Secondary | ICD-10-CM | POA: Diagnosis not present

## 2024-06-14 DIAGNOSIS — I639 Cerebral infarction, unspecified: Secondary | ICD-10-CM | POA: Diagnosis present

## 2024-06-14 DIAGNOSIS — Z91041 Radiographic dye allergy status: Secondary | ICD-10-CM

## 2024-06-14 DIAGNOSIS — Z7984 Long term (current) use of oral hypoglycemic drugs: Secondary | ICD-10-CM

## 2024-06-14 DIAGNOSIS — Z9181 History of falling: Secondary | ICD-10-CM

## 2024-06-14 DIAGNOSIS — R1310 Dysphagia, unspecified: Secondary | ICD-10-CM | POA: Diagnosis present

## 2024-06-14 LAB — COMPREHENSIVE METABOLIC PANEL WITH GFR
ALT: 45 U/L — ABNORMAL HIGH (ref 0–44)
AST: 36 U/L (ref 15–41)
Albumin: 4.5 g/dL (ref 3.5–5.0)
Alkaline Phosphatase: 113 U/L (ref 38–126)
Anion gap: 11 (ref 5–15)
BUN: 34 mg/dL — ABNORMAL HIGH (ref 6–20)
CO2: 29 mmol/L (ref 22–32)
Calcium: 9.9 mg/dL (ref 8.9–10.3)
Chloride: 103 mmol/L (ref 98–111)
Creatinine, Ser: 1.22 mg/dL (ref 0.61–1.24)
GFR, Estimated: >60 mL/min
Glucose, Bld: 260 mg/dL — ABNORMAL HIGH (ref 70–99)
Potassium: 5.3 mmol/L — ABNORMAL HIGH (ref 3.5–5.1)
Sodium: 143 mmol/L (ref 135–145)
Total Bilirubin: 0.5 mg/dL (ref 0.0–1.2)
Total Protein: 7.7 g/dL (ref 6.5–8.1)

## 2024-06-14 LAB — DIFFERENTIAL
Abs Immature Granulocytes: 0.03 K/uL (ref 0.00–0.07)
Basophils Absolute: 0 K/uL (ref 0.0–0.1)
Basophils Relative: 0 %
Eosinophils Absolute: 0.1 K/uL (ref 0.0–0.5)
Eosinophils Relative: 2 %
Immature Granulocytes: 0 %
Lymphocytes Relative: 26 %
Lymphs Abs: 1.8 K/uL (ref 0.7–4.0)
Monocytes Absolute: 0.8 K/uL (ref 0.1–1.0)
Monocytes Relative: 11 %
Neutro Abs: 4.4 K/uL (ref 1.7–7.7)
Neutrophils Relative %: 61 %

## 2024-06-14 LAB — PROTIME-INR
INR: 1 (ref 0.8–1.2)
Prothrombin Time: 14 s (ref 11.4–15.2)

## 2024-06-14 LAB — CBG MONITORING, ED
Glucose-Capillary: 250 mg/dL — ABNORMAL HIGH (ref 70–99)
Glucose-Capillary: 273 mg/dL — ABNORMAL HIGH (ref 70–99)

## 2024-06-14 LAB — I-STAT CHEM 8, ED
BUN: 41 mg/dL — ABNORMAL HIGH (ref 6–20)
Calcium, Ion: 1.2 mmol/L (ref 1.15–1.40)
Chloride: 103 mmol/L (ref 98–111)
Creatinine, Ser: 1.3 mg/dL — ABNORMAL HIGH (ref 0.61–1.24)
Glucose, Bld: 251 mg/dL — ABNORMAL HIGH (ref 70–99)
HCT: 48 % (ref 39.0–52.0)
Hemoglobin: 16.3 g/dL (ref 13.0–17.0)
Potassium: 5.1 mmol/L (ref 3.5–5.1)
Sodium: 142 mmol/L (ref 135–145)
TCO2: 30 mmol/L (ref 22–32)

## 2024-06-14 LAB — CBC
HCT: 46.9 % (ref 39.0–52.0)
Hemoglobin: 15 g/dL (ref 13.0–17.0)
MCH: 28.2 pg (ref 26.0–34.0)
MCHC: 32 g/dL (ref 30.0–36.0)
MCV: 88.2 fL (ref 80.0–100.0)
Platelets: 246 K/uL (ref 150–400)
RBC: 5.32 MIL/uL (ref 4.22–5.81)
RDW: 13.2 % (ref 11.5–15.5)
WBC: 7.2 K/uL (ref 4.0–10.5)
nRBC: 0 % (ref 0.0–0.2)

## 2024-06-14 LAB — APTT: aPTT: 22 s — ABNORMAL LOW (ref 24–36)

## 2024-06-14 LAB — ETHANOL: Alcohol, Ethyl (B): <15 mg/dL

## 2024-06-14 MED ORDER — GABAPENTIN 100 MG PO CAPS
100.0000 mg | ORAL_CAPSULE | Freq: Every day | ORAL | Status: DC
  Administered 2024-06-15 – 2024-06-19 (×4): 100 mg via ORAL
  Filled 2024-06-14 (×4): qty 1

## 2024-06-14 MED ORDER — SODIUM CHLORIDE 0.9% FLUSH
3.0000 mL | Freq: Once | INTRAVENOUS | Status: AC
  Administered 2024-06-14: 3 mL via INTRAVENOUS

## 2024-06-14 MED ORDER — SERTRALINE HCL 100 MG PO TABS
100.0000 mg | ORAL_TABLET | Freq: Every day | ORAL | Status: DC
  Administered 2024-06-15 – 2024-06-19 (×5): 100 mg via ORAL
  Filled 2024-06-14 (×5): qty 1

## 2024-06-14 MED ORDER — AMLODIPINE BESYLATE 10 MG PO TABS
10.0000 mg | ORAL_TABLET | Freq: Every day | ORAL | Status: DC
  Administered 2024-06-15 – 2024-06-16 (×2): 10 mg via ORAL
  Filled 2024-06-14: qty 1
  Filled 2024-06-14: qty 2

## 2024-06-14 MED ORDER — LORAZEPAM 2 MG/ML IJ SOLN
1.0000 mg | Freq: Once | INTRAMUSCULAR | Status: AC
  Administered 2024-06-14: 1 mg via INTRAVENOUS
  Filled 2024-06-14: qty 1

## 2024-06-14 MED ORDER — INSULIN ASPART 100 UNIT/ML IJ SOLN
0.0000 [IU] | Freq: Every day | INTRAMUSCULAR | Status: DC
  Administered 2024-06-14: 3 [IU] via SUBCUTANEOUS
  Administered 2024-06-15 – 2024-06-16 (×2): 2 [IU] via SUBCUTANEOUS
  Filled 2024-06-14: qty 2
  Filled 2024-06-14: qty 3
  Filled 2024-06-14: qty 2

## 2024-06-14 MED ORDER — FENOFIBRATE 160 MG PO TABS
160.0000 mg | ORAL_TABLET | Freq: Every day | ORAL | Status: DC
  Administered 2024-06-15 – 2024-06-19 (×5): 160 mg via ORAL
  Filled 2024-06-14 (×6): qty 1

## 2024-06-14 MED ORDER — PANTOPRAZOLE SODIUM 40 MG PO TBEC
40.0000 mg | DELAYED_RELEASE_TABLET | Freq: Every day | ORAL | Status: DC
  Administered 2024-06-15 – 2024-06-19 (×5): 40 mg via ORAL
  Filled 2024-06-14 (×5): qty 1

## 2024-06-14 MED ORDER — ACETAMINOPHEN 500 MG PO TABS
1000.0000 mg | ORAL_TABLET | Freq: Once | ORAL | Status: AC
  Administered 2024-06-14: 1000 mg via ORAL
  Filled 2024-06-14: qty 2

## 2024-06-14 MED ORDER — METFORMIN HCL ER 500 MG PO TB24
500.0000 mg | ORAL_TABLET | Freq: Two times a day (BID) | ORAL | Status: DC
  Administered 2024-06-14 – 2024-06-19 (×9): 500 mg via ORAL
  Filled 2024-06-14 (×12): qty 1

## 2024-06-14 MED ORDER — LINAGLIPTIN 5 MG PO TABS
5.0000 mg | ORAL_TABLET | Freq: Every day | ORAL | Status: DC
  Administered 2024-06-15 – 2024-06-19 (×5): 5 mg via ORAL
  Filled 2024-06-14 (×5): qty 1

## 2024-06-14 MED ORDER — EMPAGLIFLOZIN 10 MG PO TABS
10.0000 mg | ORAL_TABLET | Freq: Every day | ORAL | Status: DC
  Administered 2024-06-15 – 2024-06-19 (×5): 10 mg via ORAL
  Filled 2024-06-14 (×5): qty 1

## 2024-06-14 MED ORDER — IRBESARTAN 300 MG PO TABS
150.0000 mg | ORAL_TABLET | Freq: Every day | ORAL | Status: DC
  Administered 2024-06-15 – 2024-06-19 (×5): 150 mg via ORAL
  Filled 2024-06-14 (×5): qty 1

## 2024-06-14 MED ORDER — INSULIN ASPART 100 UNIT/ML IJ SOLN
0.0000 [IU] | Freq: Three times a day (TID) | INTRAMUSCULAR | Status: DC
  Administered 2024-06-15: 5 [IU] via SUBCUTANEOUS
  Administered 2024-06-15: 3 [IU] via SUBCUTANEOUS
  Administered 2024-06-16: 5 [IU] via SUBCUTANEOUS
  Administered 2024-06-16 – 2024-06-17 (×4): 3 [IU] via SUBCUTANEOUS
  Administered 2024-06-17 – 2024-06-18 (×2): 5 [IU] via SUBCUTANEOUS
  Administered 2024-06-18: 2 [IU] via SUBCUTANEOUS
  Administered 2024-06-18: 3 [IU] via SUBCUTANEOUS
  Administered 2024-06-19: 2 [IU] via SUBCUTANEOUS
  Filled 2024-06-14: qty 1
  Filled 2024-06-14: qty 3
  Filled 2024-06-14: qty 5
  Filled 2024-06-14: qty 2
  Filled 2024-06-14: qty 1
  Filled 2024-06-14: qty 2
  Filled 2024-06-14: qty 3
  Filled 2024-06-14 (×3): qty 5
  Filled 2024-06-14 (×2): qty 3

## 2024-06-14 MED ORDER — GABAPENTIN 300 MG PO CAPS
300.0000 mg | ORAL_CAPSULE | Freq: Every day | ORAL | Status: DC
  Administered 2024-06-14 – 2024-06-18 (×5): 300 mg via ORAL
  Filled 2024-06-14 (×5): qty 1

## 2024-06-14 NOTE — Procedures (Signed)
 Patient Name: Eric Cummings  MRN: [id number]  Epilepsy Attending: Arlin MALVA Krebs  Referring Physician/Provider: Waddell Karna LABOR, NP  Date: 06/15/2023 Duration: 22.56 mins  Patient history: 59 y.o. 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 EEG to evaluate for seizure  Level of alertness: Awake  AEDs during EEG study: Ativan   Technical aspects: This EEG study was done with scalp electrodes positioned according to the 10-20 International system of electrode placement. Electrical activity was reviewed with band pass filter of 1-70Hz , sensitivity of 7 uV/mm, display speed of 66mm/sec with a 60Hz  notched filter applied as appropriate. EEG data were recorded continuously and digitally stored.  Video monitoring was available and reviewed as appropriate.  Description: The posterior dominant rhythm consists of 10 Hz activity of moderate voltage (25-35 uV) seen predominantly in posterior head regions, symmetric and reactive to eye opening and eye closing. There is an excessive amount of 15 to 18 Hz beta activity distributed symmetrically and diffusely. Hyperventilation and photic stimulation were not performed.     ABNORMALITY - Excessive beta, generalized  IMPRESSION: This study is within normal limits. The excessive beta activity seen in the background is most likely due to the effect of benzodiazepine and is a benign EEG pattern. No seizures or epileptiform discharges were seen throughout the recording.  A normal interictal EEG does not exclude the diagnosis of epilepsy.   Eric Cummings

## 2024-06-14 NOTE — Progress Notes (Signed)
 TOC consult received for potential placement. Eric Cummings is a 59 yo m w/ a hx of ASD (nonverbal at baseline) who presents to the ED from his adult Daycare [facility]) with initial concern for stroke. ED staff attempted to ambulate patient several times but he has been favoring R leg. Work up for this is ongoing, includes consult to PT/OT.   CSW met with patient's brother and legal guardian, Eric Cummings ([phone]), at bedside. Eric Cummings confirms the above, shares patient lives with his sister Eric Cummings [phone]) and Eric Cummings is also active in patient's care. Eric Cummings is not sure about the etiology of patient's leg but feels it is acute. States he has had issues with neuropathy in the past but nothing that would explain this. States patient is typically ambulatory without assistance devices at baseline. We discussed PT/OT's role in disposition planning. He shares concerns regarding if SNF would be an appropriate level of care for patient given his sensitivity to stimulus and potential for behaviors but also states he is not sure if family would be able to support patient adequately if he cannot ambulate independently. States he would be more interested in [facility] if therapy is recommended but if patient cannot ambulate, would need to discuss with Eric Cummings. Would likely need DME at that point as well.   Education on next steps provided. Eric Cummings is understanding that PT/OT recs are currently pending. Is expectant of follow up from [facility] after PT/OT eval. TOC following.

## 2024-06-14 NOTE — Consult Note (Addendum)
 NEUROLOGY CONSULT NOTE   Date of service: June 14, 2024 Patient Name: Eric Cummings MRN:  [id number] DOB:  1966/02/13 Chief Complaint: code stroke  Requesting Provider: Towana Ozell BROCKS, MD  History of Present Illness  Eric Cummings is a 59 y.o. 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 He was also noted to be incontinent on arrival.  But no definite witnessed seizure activity. CT head with no acute process. He has an iodine allergy  listed in chart, unsure what the reaction is so will hold off on CTA due to this, as this does not appear to be an LVO. While in CT scan patient had improvement in his mentation and was able to interact with us  and following commands/mimic and move right side.  Obtained stat MRI of the brain which is negative for acute stroke.  Patient will be admitted to the medical team for further evaluation will check EEG and lab work.  May consider adding anticonvulsants if he has further seizure-like episodes  LKW: 1200 Modified rankin score: 3-Moderate disability-requires help but walks WITHOUT assistance IV Thrombolysis:  No likely not a stroke  EVT: NO LVO    NIHSS components Score: Comment  1a Level of Conscious 0[]  1[]  2[]  3[]      1b LOC Questions 0[]  1[]  2[x]       1c LOC Commands 0[]  1[]  2[]       2 Best Gaze 0[]  1[]  2[]       3 Visual 0[]  1[]  2[]  3[]      4 Facial Palsy 0[]  1[]  2[]  3[]      5a Motor Arm - left 0[]  1[]  2[]  3[]  4[]  UN[]    5b Motor Arm - Right 0[]  1 2[]  3[]  4[]  UN[]    6a Motor Leg - Left 0[]  1[x]  2[]  3[]  4[]  UN[]    6b Motor Leg - Right 0[]  1[]  2[x]  3[]  4[]  UN[]    7 Limb Ataxia 0[]  1[]  2[]  UN[]      8 Sensory 0[]  1[]  2[]  UN[]      9 Best Language 0[]  1[]  2[x]  3[]      10 Dysarthria 0[]  1[]  2[x]  UN[]      11 Extinct. and Inattention 0[]  1[]  2[]        TOTAL:       ROS  Comprehensive ROS Unable to ascertain due to nonverbal at baseline   Past History   Past Medical History:  Diagnosis Date   Diabetes mellitus without complication (HCC)    Eczema    Urticaria     No past surgical history on file.  Family History: Family History  Problem Relation Age of Onset   Allergic rhinitis Mother    Asthma Father    Allergic rhinitis Father    Eczema Sister     Social History  reports that he has never smoked. He has never used smokeless tobacco. He reports that he does not drink alcohol and does not use drugs.  Allergies[1]  Medications  Current Medications[2]  Vitals   Vitals:   06/14/24 1400  Weight: 73.4 kg    Body mass index is 23.22 kg/m.   Physical Exam   Constitutional: Appears well-developed and well-nourished.  Psych: Affect appropriate to situation.  Eyes: No scleral injection.  HENT: No OP obstruction.  Head: Normocephalic.  Cardiovascular: Normal rate and regular rhythm.  Respiratory: Effort normal, non-labored breathing.  GI: Soft.  No distension. There is no tenderness.  Skin: WDI.   Neurologic Examination   Mental Status -  He is awake and alert, nonverbal at baseline, following commands   Cranial Nerves II - XII - II - Visual field intact blinks to threat bilaterally  III, IV, VI - Extraocular movements intact tracks . V - Facial sensation intact bilaterally . VII - Facial movement intact bilaterally . VIII - Hearing & vestibular intact bilaterally . X - unable to assess  XI - unable to assess  XII - unable to assess   Motor Strength - bilateral uppers appear equal with no drift, left leg with no drift, right leg with slight drift   Bulk was normal and fasciculations were absent .   Motor Tone - Muscle tone was assessed at the neck and appendages and was normal . Sensory - Light touch, temperature/pinprick were assessed and were symmetrical .   Coordination - unable to assess    Gait and Station - deferred.  Labs/Imaging/Neurodiagnostic studies   CBC:  Recent Labs  Lab 2024/06/29 1400 06/29/24 1402  WBC 7.2  --   NEUTROABS 4.4  --   HGB 15.0 16.3  HCT 46.9 48.0  MCV 88.2  --   PLT 246  --    Basic Metabolic Panel:  Lab Results  Component Value Date   NA 142 29-Jun-2024   K 5.1 Jun 29, 2024   CO2 22 03/20/2024   GLUCOSE 251 (H) 29-Jun-2024   BUN 41 (H) 2024-06-29   CREATININE 1.30 (H) 2024/06/29   CALCIUM 9.9 03/20/2024   GFRNONAA >60 06/17/2009   GFRAA  06/17/2009    >60        The eGFR has been calculated using the MDRD equation. This calculation has not been validated in all clinical situations. eGFR's persistently <60 mL/min signify possible Chronic Kidney Disease.   Lipid Panel: No results found for: LDLCALC HgbA1c: No results found for: HGBA1C Urine Drug Screen: No results found for: LABOPIA, COCAINSCRNUR, LABBENZ, AMPHETMU, THCU, LABBARB  Alcohol Level No results found for: ETH INR No results found for: INR APTT No results found for: APTT AED levels: No results found for: PHENYTOIN, ZONISAMIDE, LAMOTRIGINE, LEVETIRACETA  CT Head without contrast(Personally reviewed):  NO acute process  Neurodiagnostics rEEG:  ordered  ASSESSMENT   Eric Cummings is a 59 y.o. 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 He was also noted to be incontinent on arrival.  Strokelike episode-possibly unwitnessed seizure with postictal state RECOMMENDATIONS   - obtain STAT routine EEG to evaluate for seizures  - Check BMP, CBC, UA Admit to medical team - Neurology will follow above tests  ______________________________________________________________________    Signed, Karna DELENA Geralds, NP [facility]  I have personally obtained history,examined this  patient, reviewed notes, independently viewed imaging studies, participated in medical decision making and plan of care.ROS completed by me personally and pertinent positives fully documented  I have made any additions or clarifications directly to the above note. Agree with note above.  59 year old autistic male who is nonverbal at baseline and poorly function was at adult daycare when they noticed he was leaning to the right and not moving the right leg as much.  Unclear as to how much leg weakness and gait difficulty he has  at baseline as he is wearing bilateral foot braces suspect from his underlying diabetic neuropathy and gait difficulty.  Neurological exam limited due to patient's condition however CT head shows no acute abnormality.  Given not give IV TNK because his deficits are difficult to quantify not sure if they are new.  CT angiogram will be deferred as patient has a listed iodine allergy .  Stat limited MRI scan of the brain was obtained which shows no acute stroke.  Recommend check EEG for seizure activity and check admission panel labs CBC and UA.  Stroke team will follow.  Kindly call for questions.  Long discussion with Dr. Towana ER MD and answered questions   I personally spent a total of 55 minutes in the care of the patient today including getting/reviewing separately obtained history, performing a medically appropriate exam/evaluation, counseling and educating, placing orders, referring and communicating with other health care professionals, documenting clinical information in the EHR, independently interpreting results, and coordinating care.        Eather Popp, MD Medical Director [facility] Pager: [phone] 06/14/2024 3:48 PM     [1]  Allergies Allergen Reactions   Ace Inhibitors     Other reaction(s): cough Other reaction(s): cough   Iodine     Other reaction(s): Unknown Other reaction(s): Unknown   Shellfish Allergy      Other reaction(s): Unknown    Shellfish Protein-Containing Drug Products     Other reaction(s): Unknown Other reaction(s): Unknown   Simvastatin     Other reaction(s): intolerance to Other reaction(s): intolerance to  [2]  Current Facility-Administered Medications:    sodium chloride  flush (NS) 0.9 % injection 3 mL, 3 mL, Intravenous, Once, Towana Ozell BROCKS, MD  Current Outpatient Medications:    ACCU-CHEK AVIVA PLUS test strip, 1 each 3 (three) times daily., Disp: , Rfl:    Accu-Chek FastClix Lancets MISC, Apply topically., Disp: , Rfl:    amLODipine  (NORVASC ) 10 MG tablet, Take 10 mg by mouth daily., Disp: , Rfl:    B-D ULTRAFINE III SHORT PEN 31G X 8 MM MISC, SMARTSIG:SUB-Q 1-3 Times Daily, Disp: , Rfl:    Blood Glucose Calibration (ACCU-CHEK AVIVA) SOLN, use to calibrate meter Dx:  E11.65, Disp: , Rfl:    Canagliflozin 100 MG TABS, Take by mouth., Disp: , Rfl:    cetirizine  (ZYRTEC  ALLERGY ) 10 MG tablet, Take 1 tablet (10 mg total) by mouth 2 (two) times daily as needed (itching, allergies)., Disp: 60 tablet, Rfl: 5   empagliflozin  (JARDIANCE ) 10 MG TABS tablet, Take 1 tablet by mouth daily., Disp: , Rfl:    EPINEPHrine  0.3 mg/0.3 mL IJ SOAJ injection, Inject 0.3 mg into the muscle as needed for anaphylaxis., Disp: 2 each, Rfl: 1   fenofibrate  160 MG tablet, Take 160 mg by mouth daily., Disp: , Rfl:    fenofibrate  160 MG tablet, Take 1 tablet by mouth daily., Disp: , Rfl:    fenofibrate  160 MG tablet, TAKE 1 TABLET BY MOUTH EVERY DAY Orally Once a day for 90 days, Disp: , Rfl:    Fluocinolone  Acetonide Body (DERMA-SMOOTHE /FS BODY) 0.01 % OIL, Apply once a day on the whole body. Once rash/itching clears up only use up to twice per week., Disp: 118 mL, Rfl: 3   gabapentin  (NEURONTIN ) 100 MG capsule, TK 1 C PO IN MORNING AND 3 CAPSULE HS, Disp: , Rfl:    glyBURIDE-metformin  (GLUCOVANCE) 2.5-500 MG per tablet, Take 1 tablet by mouth daily with breakfast., Disp: , Rfl:  HUMALOG 100 UNIT/ML injection, Inject into  the skin., Disp: , Rfl:    ibuprofen  (ADVIL ) 800 MG tablet, Take 1 tablet (800 mg total) by mouth every 6 (six) hours as needed., Disp: 60 tablet, Rfl: 1   Incontinence Supply Disposable (DEPEND EASY FIT UNDERGARMENTS) MISC, 3 per day as directed TID for 90 days, Disp: , Rfl:    insulin  glargine (LANTUS ) 100 UNIT/ML injection, Inject into the skin at bedtime., Disp: , Rfl:    insulin  lispro (HUMALOG) 100 UNIT/ML injection, INJECT UNDER SKIN 0 UNIT(0-150), 3 UNITS(151-200), 5 UNITS(201-300) AND 7 UNITS IF GREATER THAN 301., Disp: , Rfl:    Insulin  Syringe-Needle U-100 (INSULIN  SYRINGE .5CC/31GX5/16) 31G X 5/16 0.5 ML MISC, USE UNDER THE SKIN UP TO THREE TIMES DAILY., Disp: , Rfl:    metFORMIN  (GLUCOPHAGE -XR) 500 MG 24 hr tablet, TK 2 TS PO QAM AND 1 T QPM, Disp: , Rfl:    NONFORMULARY OR COMPOUNDED ITEM, [facility] compound:  Peripheral Neuropathy cream - Bupivacaine 1%, Doxepin 3%, Gabapentin  6%, Pentoxifylline 3%, Topiramate 1%, dispense 120 grams, apply 1-2 grams to affected area 3-4 times daily, +2refills., Disp: 120 each, Rfl: 2   olmesartan (BENICAR) 20 MG tablet, Take 20 mg by mouth daily., Disp: , Rfl:    olmesartan (BENICAR) 20 MG tablet, Take 1 tablet by mouth daily., Disp: , Rfl:    omeprazole (PRILOSEC) 20 MG capsule, Take 20 mg by mouth daily., Disp: , Rfl:    polyethylene glycol (MIRALAX) 17 g packet, 1 packet mixed with 8 ounces of fluid Orally Once a day for 90 days, Disp: , Rfl:    polyethylene glycol powder (MIRALAX) 17 GM/SCOOP powder, 1 packet mixed with 8 ounces of fluid Orally Once a day for 90 days, Disp: , Rfl:    sitaGLIPtin (JANUVIA) 100 MG tablet, Take 1 tablet by mouth daily., Disp: , Rfl:    triazolam (HALCION) 0.125 MG tablet, TAKE 3 TABLETS BY MOUTH PRIOR TO DENTAL PROCEDURE AS DIRECTED BY DENTAL STAFF, Disp: , Rfl: 0

## 2024-06-14 NOTE — ED Notes (Signed)
 Pt back from MRI.

## 2024-06-14 NOTE — ED Provider Notes (Signed)
 " [place] EMERGENCY DEPARTMENT AT [facility] Provider Note   CSN: [id number] Arrival date & time: 06/14/24  1354     Patient presents with: No chief complaint on file.   Eric Cummings is a 59 y.o. male.  He is brought in by a EMS as a code stroke activation.  At baseline he is autistic minimally verbal.  Usually is ambulatory independently though.  He went to adult daycare today at 9 PM.  He was later noted by staff to be unable to get out of the chair.  EMS noted right leg weakness.  Patient is not particularly compliant with with exam so difficult to ascertain what his deficits are.  He has bilateral AFOs in place.  Level 5 caveat secondary to nonverbal, developmental delay.   The history is provided by the EMS personnel.  Cerebrovascular Accident This is a new problem. The current episode started 1 to 2 hours ago. The problem occurs constantly. The problem has not changed since onset.He has tried nothing for the symptoms. The treatment provided no relief.       Prior to Admission medications  Medication Sig Start Date End Date Taking? Authorizing Provider  ACCU-CHEK AVIVA PLUS test strip 1 each 3 (three) times daily. 02/23/20   [provider]  Accu-Chek FastClix Lancets MISC Apply topically. 02/23/20   [provider]  amLODipine  (NORVASC ) 10 MG tablet Take 10 mg by mouth daily.    [provider]  B-D ULTRAFINE III SHORT PEN 31G X 8 MM MISC SMARTSIG:SUB-Q 1-3 Times Daily 08/08/20   [provider]  Blood Glucose Calibration (ACCU-CHEK AVIVA) SOLN use to calibrate meter Dx:  E11.65 07/02/14   [provider]  Canagliflozin 100 MG TABS Take by mouth.    [provider]  cetirizine  (ZYRTEC  ALLERGY ) 10 MG tablet Take 1 tablet (10 mg total) by mouth 2 (two) times daily as needed (itching, allergies). 04/17/24   Luke Orlan HERO, DO  empagliflozin  (JARDIANCE ) 10 MG TABS tablet Take 1 tablet by mouth daily.    [provider]  EPINEPHrine  0.3 mg/0.3 mL IJ SOAJ injection Inject 0.3 mg into the muscle as needed for anaphylaxis. 04/17/24   Luke Orlan HERO, DO  fenofibrate  160 MG tablet Take 160 mg by mouth daily.    [provider]  fenofibrate  160 MG tablet Take 1 tablet by mouth daily.    [provider]  fenofibrate  160 MG tablet TAKE 1 TABLET BY MOUTH EVERY DAY Orally Once a day for 90 days    [provider]  Fluocinolone  Acetonide Body (DERMA-SMOOTHE /FS BODY) 0.01 % OIL Apply once a day on the whole body. Once rash/itching clears up only use up to twice per week. 04/17/24   Luke Orlan HERO, DO  gabapentin  (NEURONTIN ) 100 MG capsule TK 1 C PO IN MORNING AND 3 CAPSULE HS 11/12/18   [provider]  glyBURIDE-metformin  (GLUCOVANCE) 2.5-500 MG per tablet Take 1 tablet by mouth daily with breakfast.    [provider]  HUMALOG 100 UNIT/ML injection Inject into the skin. 10/09/20   [provider]  ibuprofen  (ADVIL ) 800 MG tablet Take 1 tablet (800 mg total) by mouth every 6 (six) hours as needed. 03/22/24   Tobie Franky SQUIBB, DPM  Incontinence Supply Disposable (DEPEND EASY FIT UNDERGARMENTS) MISC 3 per day as directed TID for 90 days    [provider]  insulin  glargine (LANTUS ) 100 UNIT/ML injection Inject into the skin at bedtime.  [provider]  insulin  lispro (HUMALOG) 100 UNIT/ML injection INJECT UNDER SKIN 0 UNIT(0-150), 3 UNITS(151-200), 5 UNITS(201-300) AND 7 UNITS IF GREATER THAN 301.    [provider]  Insulin  Syringe-Needle U-100 (INSULIN  SYRINGE .5CC/31GX5/16) 31G X 5/16 0.5 ML MISC USE UNDER THE SKIN UP TO THREE TIMES DAILY.    [provider]  metFORMIN  (GLUCOPHAGE -XR) 500 MG 24 hr tablet TK 2 TS PO QAM AND 1 T QPM 11/14/18   [provider]  NONFORMULARY OR COMPOUNDED ITEM [facility] compound:  Peripheral Neuropathy cream - Bupivacaine 1%, Doxepin 3%, Gabapentin  6%, Pentoxifylline 3%, Topiramate  1%, dispense 120 grams, apply 1-2 grams to affected area 3-4 times daily, +2refills. 09/06/15   Gershon Donnice SAUNDERS, DPM  olmesartan (BENICAR) 20 MG tablet Take 20 mg by mouth daily.    [provider]  olmesartan (BENICAR) 20 MG tablet Take 1 tablet by mouth daily.    [provider]  omeprazole (PRILOSEC) 20 MG capsule Take 20 mg by mouth daily.    [provider]  polyethylene glycol (MIRALAX) 17 g packet 1 packet mixed with 8 ounces of fluid Orally Once a day for 90 days    [provider]  polyethylene glycol powder (MIRALAX) 17 GM/SCOOP powder 1 packet mixed with 8 ounces of fluid Orally Once a day for 90 days    [provider]  sitaGLIPtin (JANUVIA) 100 MG tablet Take 1 tablet by mouth daily.    [provider]  triazolam (HALCION) 0.125 MG tablet TAKE 3 TABLETS BY MOUTH PRIOR TO DENTAL PROCEDURE AS DIRECTED BY DENTAL STAFF 03/07/18   [provider]    Allergies: Ace inhibitors, Iodine, Shellfish allergy , Shellfish protein-containing drug products, and Simvastatin    Review of Systems  Unable to perform ROS: Patient nonverbal    Updated Vital Signs BP 132/87 (BP Location: Right Arm)   Pulse 76   Temp 98.2 F (36.8 C) (Oral)   Resp 14   Ht 5' 10 (1.778 m)   Wt 74 kg   SpO2 99%   BMI 23.41 kg/m   Physical Exam Vitals and nursing note reviewed.  Constitutional:      General: He is not in acute distress.    Appearance: Normal appearance. He is well-developed.  HENT:     Head: Normocephalic and atraumatic.  Eyes:     Conjunctiva/sclera: Conjunctivae normal.  Cardiovascular:     Rate and Rhythm: Normal rate and regular rhythm.     Heart sounds: No murmur heard. Pulmonary:     Effort: Pulmonary effort is normal. No respiratory distress.     Breath sounds: Normal breath sounds.  Abdominal:     Palpations: Abdomen is soft.     Tenderness: There is no abdominal tenderness. There is no guarding or rebound.   Musculoskeletal:     Cervical back: Neck supple.     Right lower leg: No edema.     Left lower leg: No edema.     Comments: Bilateral ankle-foot orthotics  Skin:    General: Skin is warm and dry.     Capillary Refill: Capillary refill takes less than 2 seconds.  Neurological:     Mental Status: He is alert.     Comments: Patient is awake.  He is not answering questions.  He is not following commands.     (all labs ordered are listed, but only abnormal results are displayed) Labs Reviewed  APTT - Abnormal; Notable for the following components:  Result Value   aPTT 22 (*)    All other components within normal limits  COMPREHENSIVE METABOLIC PANEL WITH GFR - Abnormal; Notable for the following components:   Potassium 5.3 (*)    Glucose, Bld 260 (*)    BUN 34 (*)    ALT 45 (*)    All other components within normal limits  HEMOGLOBIN A1C - Abnormal; Notable for the following components:   Hgb A1c MFr Bld 8.7 (*)    All other components within normal limits  CBG MONITORING, ED - Abnormal; Notable for the following components:   Glucose-Capillary 250 (*)    All other components within normal limits  I-STAT CHEM 8, ED - Abnormal; Notable for the following components:   BUN 41 (*)    Creatinine, Ser 1.30 (*)    Glucose, Bld 251 (*)    All other components within normal limits  CBG MONITORING, ED - Abnormal; Notable for the following components:   Glucose-Capillary 273 (*)    All other components within normal limits  CBG MONITORING, ED - Abnormal; Notable for the following components:   Glucose-Capillary 199 (*)    All other components within normal limits  PROTIME-INR  CBC  DIFFERENTIAL  ETHANOL    EKG: EKG Interpretation Date/Time:  Wednesday June 14 2024 14:45:34 EST Ventricular Rate:  70 PR Interval:  210 QRS Duration:  86 QT Interval:  396 QTC Calculation: 428 R Axis:   91  Text Interpretation: Sinus rhythm Prolonged PR interval Borderline right axis  deviation Anteroseptal infarct, old No old tracing to compare Confirmed by Towana Sharper [phone]) on 06/14/2024 2:47:56 PM  Radiology: [facility] Lumbar Spine Complete Result Date: 06/14/2024 EXAM: 4 VIEW(S) XRAY OF THE LUMBAR SPINE 06/14/2024 06:25:00 PM COMPARISON: None available. CLINICAL HISTORY: PAIN PAIN PAIN FINDINGS: LUMBAR SPINE: BONES: Vertebral body heights are maintained. Alignment is normal. DISCS AND DEGENERATIVE CHANGES: Moderate facet arthrosis L3 - S1. SOFT TISSUES: No acute abnormality. IMPRESSION: 1. 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 Electronically signed by: Dorethia Molt MD 06/14/2024 06:29 PM EST RP Workstation: [id number]   DG Femur Portable Min 2 Views Right Result Date: 06/14/2024 EXAM: 2 VIEW(S) XRAY OF THE RIGHT FEMUR 06/14/2024 04:52:00 PM COMPARISON: None available. CLINICAL HISTORY: right hip? right hip? right hip? FINDINGS: BONES AND JOINTS: No acute fracture. No malalignment. SOFT TISSUES: Mild vascular calcifications. IMPRESSION: 1. No acute findings. Electronically signed by: Morgane Naveau MD 06/14/2024 05:03 PM EST RP Workstation: [id number]   DG Tibia/Fibula Right Result Date: 06/14/2024 CLINICAL DATA:  Possible right lower leg abnormality. No known injury. EXAM: RIGHT TIBIA AND FIBULA - 2 VIEW COMPARISON:  None Available. FINDINGS: There is no evidence of fracture or other focal bone lesions. Soft tissues are unremarkable. IMPRESSION: Negative. Electronically Signed   By: Toribio Agreste M.D.   On: 06/14/2024 17:01   DG Pelvis Portable Result Date: 06/14/2024 CLINICAL DATA:  Right hip pain. EXAM: PORTABLE PELVIS 1-2 VIEWS COMPARISON:  None Available. FINDINGS: No acute fracture or dislocation on this single image. There is heterogeneous density over the right femoral head. The bones are well mineralized. No significant arthritic changes. The soft tissues are unremarkable. IMPRESSION: No acute fracture or dislocation. Electronically Signed   By: Vanetta Chou M.D.    On: 06/14/2024 16:58   EEG adult Result Date: 06/14/2024 Shelton Arlin KIDD, MD     06/14/2024  4:39 PM Patient Name: Eric Cummings MRN: [id number] Epilepsy Attending: Arlin KIDD Shelton Referring Physician/Provider: Waddell Karna LABOR, NP  Date: 06/15/2023 Duration: 22.56 mins Patient history: 59 y.o. 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 EEG to evaluate for seizure Level of alertness: Awake AEDs during EEG study: Ativan  Technical aspects: This EEG study was done with scalp electrodes positioned according to the 10-20 International system of electrode placement. Electrical activity was reviewed with band pass filter of 1-70Hz , sensitivity of 7 uV/mm, display speed of 42mm/sec with a 60Hz  notched filter applied as appropriate. EEG data were recorded continuously and digitally stored.  Video monitoring was available and reviewed as appropriate. Description: The posterior dominant rhythm consists of 10 Hz activity of moderate voltage (25-35 uV) seen predominantly in posterior head regions, symmetric and reactive to eye opening and eye closing. There is an excessive amount of 15 to 18 Hz beta activity distributed symmetrically and diffusely. Hyperventilation and photic stimulation were not performed.   ABNORMALITY - Excessive beta, generalized IMPRESSION: This study is within normal limits. The excessive beta activity seen in the background is most likely due to the effect of benzodiazepine and is a benign EEG pattern. No seizures or epileptiform discharges were seen throughout the recording. A normal interictal EEG does not exclude the diagnosis of epilepsy. Arlin MALVA Krebs   MR BRAIN WO CONTRAST Result Date: 06/14/2024 HISTORY: stroke EXAM: MR BRAIN WITHOUT CONTRAST TECHNIQUE: Multiplanar, multiecho pulse sequences of the brain and surrounding structures  were obtained without intravenous contrast. Axial and coronal DWI, axial FLAIR, and axial GRE sequences were obtained. COMPARISON:  Same date CT head FINDINGS: The study is incomplete as the patient could not tolerate completing the exam. Axial and coronal DWI, motion degraded axial FLAIR, and axial GRE sequences were obtained. There is no evidence of acute infarct on the axial DWI sequence. There is no definite evidence of acute intracranial hemorrhage. Parenchymal volume is within normal limits. The ventricles are normal in size. Mild FLAIR signal abnormality in the periventricular white matter is nonspecific but may reflect mild underlying chronic small vessel ischemic change. There is no definite mass lesion. There is no mass effect or midline shift. IMPRESSION: 1. Incomplete study with axial and coronal DWI, motion degraded axial FLAIR, and axial GRE sequences obtained. 2.  No evidence of acute intracranial pathology on the provided images. Electronically signed by: Maude Harry MD 06/14/2024 03:27 PM EST RP Workstation: [id number]   CT HEAD CODE STROKE WO CONTRAST Result Date: 06/14/2024 EXAM: CT HEAD WITHOUT CONTRAST 06/14/2024 02:06:59 PM TECHNIQUE: CT of the head was performed without the administration of intravenous contrast. Automated exposure control, iterative reconstruction, and/or weight based adjustment of the mA/kV was utilized to reduce the radiation dose to as low as reasonably achievable. COMPARISON: None available. CLINICAL HISTORY: The patient presents with an acute neurological deficit, and a stroke is suspected. FINDINGS: BRAIN AND VENTRICLES: No acute hemorrhage. No evidence of acute infarct. No hydrocephalus. No extra-axial collection. No mass effect or midline shift. [place] Stroke Program Early CT (ASPECT) score: Ganglionic (caudate, IC, lentiform nucleus, insula, M1-M3): 7 Supraganglionic (M4-M6): 3 Total: 10 ORBITS: No acute abnormality. SINUSES: Polyps versus mucous retention cysts  in the maxillary sinuses. SOFT TISSUES AND SKULL: No acute soft tissue abnormality. No skull fracture. IMPRESSION: 1. No acute intracranial abnormality. 2. ASPECT score is 10. 3. Findings messaged to Dr. Rosemarie via the [facility] messaging system at 2:24 PM on 06/14/24. Electronically signed by: Donnice Mania  MD 06/14/2024 02:24 PM EST RP Workstation: [id number]     .Critical Care  Performed by: Towana Ozell BROCKS, MD Authorized by: Towana Ozell BROCKS, MD   Critical care provider statement:    Critical care time (minutes):  45   Critical care time was exclusive of:  Separately billable procedures and treating other patients   Critical care was necessary to treat or prevent imminent or life-threatening deterioration of the following conditions:  CNS failure or compromise   Critical care was time spent personally by me on the following activities:  Development of treatment plan with patient or surrogate, discussions with consultants, evaluation of patient's response to treatment, examination of patient, obtaining history from patient or surrogate, ordering and performing treatments and interventions, ordering and review of laboratory studies, ordering and review of radiographic studies, pulse oximetry, re-evaluation of patient's condition and review of old charts   I assumed direction of critical care for this patient from another provider in my specialty: no      Medications Ordered in the ED  amLODipine  (NORVASC ) tablet 10 mg (has no administration in time range)  empagliflozin  (JARDIANCE ) tablet 10 mg (has no administration in time range)  fenofibrate  tablet 160 mg (has no administration in time range)  metFORMIN  (GLUCOPHAGE -XR) 24 hr tablet 500 mg (500 mg Oral Given 06/14/24 2240)  insulin  aspart (novoLOG ) injection 0-15 Units (has no administration in time range)  insulin  aspart (novoLOG ) injection 0-5 Units (3 Units Subcutaneous Given 06/14/24 2239)  irbesartan  (AVAPRO ) tablet 150 mg (has no  administration in time range)  pantoprazole  (PROTONIX ) EC tablet 40 mg (has no administration in time range)  sertraline  (ZOLOFT ) tablet 100 mg (has no administration in time range)  linagliptin  (TRADJENTA ) tablet 5 mg (has no administration in time range)  gabapentin  (NEURONTIN ) capsule 300 mg (300 mg Oral Given 06/14/24 2240)  gabapentin  (NEURONTIN ) capsule 100 mg (has no administration in time range)  sodium chloride  flush (NS) 0.9 % injection 3 mL (3 mLs Intravenous Given 06/14/24 1526)  LORazepam  (ATIVAN ) injection 1 mg (1 mg Intravenous Given 06/14/24 1448)  acetaminophen  (TYLENOL ) tablet 1,000 mg (1,000 mg Oral Given 06/14/24 1801)    Clinical Course as of 06/15/24 0857  Wed Jun 14, 2024  1419 CT does not show any significant findings per Dr. Rosemarie.  He said his exam is improving.  He has a contrast allergy  of unclear significance so he does not want to proceed with a CTA.  He is not a candidate for TNK.  He feels if he does not completely resolve would benefit from medical admission for further workup. [MB]  1445 Neurology would now like him to get an MRI if possible.  He does seem like he has got some right leg weakness.  Cooperation is difficult with him.  Have ordered him some Ativan .  Labs coming back with potassium of 5.3 but with some hemolysis.  Sister is here and confirms he is nonverbal and sometimes will not follow commands [MB]    Clinical Course User Index [MB] Towana Ozell BROCKS, MD                                 Medical Decision Making Amount and/or Complexity of Data Reviewed Labs: ordered. Radiology: ordered.  Risk OTC drugs. Prescription drug management.   This patient complains of occultly ambulating; this involves an extensive number of treatment Options and is a complaint that carries with it a  high risk of complications and morbidity. The differential includes stroke, seizure, trauma, metabolic derangement  I ordered, reviewed and interpreted labs, which  included CBC normal, chemistries elevated potassium possible hemolysis, elevated glucose, alcohol negative I ordered medication Ativan  for sedation and reviewed PMP when indicated. I ordered imaging studies which included CT head, MRI brain and I independently    visualized and interpreted imaging which showed no acute findings Additional history obtained from patient's sister and brother, EMS Previous records obtained and reviewed in epic including outpatient PCP notes I consulted neurology Dr. Rosemarie and discussed lab and imaging findings and discussed disposition.  Cardiac monitoring reviewed, sinus rhythm Social determinants considered, no significant barriers Critical Interventions: Code stroke activation requiring bedside presence, multiple reassessments, evaluation of complex imaging  After the interventions stated above, I reevaluated the patient and found patient to be awake alert and still not really using his right leg Admission and further testing considered, his care is signed out to Dr. Ruthe to follow-up on results of MRI and further neurology recommendations.      Final diagnoses:  Weakness of right leg    ED Discharge Orders     None          Towana Ozell BROCKS, MD 06/15/24 [phone]  "

## 2024-06-14 NOTE — Code Documentation (Signed)
 Stroke Response Nurse Documentation Code Documentation  Eric Cummings is a 59 y.o. male arriving to [facility]  via [facility] on 06/14/2024 with past medical hx of autism, non-verbal. On No antithrombotic. Code stroke was activated by EMS.   Patient from adult day care where he was LKW at 1200 and now complaining of Rt leg weakness. Pt walked into day care at 1200, then later fell when trying to stand up due to rt leg weakness. He is non-verbal at baseline.  Stroke team at the bedside on patient arrival. Labs drawn and patient cleared for CT by Dr. Towana. Patient to CT with team. NIHSS 9, see documentation for details and code stroke times. Patient with disoriented, right leg weakness, Expressive aphasia , and dysarthria  on exam. The following imaging was completed:  CT Head and MRI. Patient is not a candidate for IV Thrombolytic due to improving symptoms, stroke not suspected. Patient is not a candidate for IR due to no new LVO symptoms, stroke not suspected..   Care Plan:    In window monitoring: q73min NIHSS and VS until OOW at 16:30. Then q 2.  NPO until Stroke Swallow screen obtained.   Bedside handoff with ED RN Rolin.    Eric Cummings  Stroke Response RN

## 2024-06-14 NOTE — ED Notes (Addendum)
 Provider at bedside to further assess pt.

## 2024-06-14 NOTE — ED Notes (Signed)
 Nurse and tech attempted to get pt OOB and ambulate with a walker per provider request and pt is unable to bear weight to right leg. Provider made aware.

## 2024-06-14 NOTE — TOC CM/SW Note (Signed)
 TOC consult received for d/c planning needs. Possible need for SNF placement. PT eval pending. Follow-up to be completed with patient as appropriate.   Merilee Batty, MSN, RN Case Management [phone]

## 2024-06-14 NOTE — ED Notes (Signed)
 Patient transported to MRI

## 2024-06-14 NOTE — Progress Notes (Signed)
 STAT EEG complete. Results pending

## 2024-06-14 NOTE — Progress Notes (Signed)
 To MRI with RN and SRN. 1 mg ativan  given for h/o claustrophobia. Pt monitored throughout.

## 2024-06-14 NOTE — ED Triage Notes (Signed)
 Pt brought in to ED by [facility] from a adult daycare. Reports pt arrive to adult daycare and walked in without difficulty at around noon. Pt baseline walks with no assistive devices and is nonverbal. Around 1230 staff noted that pt was unable to walk and was showing signs of right sided weakness. EMS arrive and reports pt was unable to bear weight to right leg without assistance and was unable to walk.   EMS vitals BP146/90 P 72 SpO2 100RA CBG 276

## 2024-06-14 NOTE — ED Provider Notes (Signed)
 Patient handed off to me awaiting MRI.  Seem to have maybe sudden right lower leg weakness that started this afternoon.  He has got history of autism very hard to get a history from him.  MRI is negative.  Although somewhat limited in the views Dr. Rosemarie with neurology states MRI is okay and no further need for repeat MRI any other further neurologic workup.  His EEG is also normal.  I got x-rays of his hip back femur tib-fib and there is no obvious fracture or malalignment.  He is not really tender on exam when I flex and extend at the hip knee or ankle.  He is got Doppler pulses on exam.  I do not think this is an arterial process.  I do think that this is likely may be some sort of soft tissue issue and he is a little bit uncomfortable as we tried several times to try to get him up to walk and he really seems to shy away from put in his right foot down.  Ultimately had very low suspicion for DVT.  But let him board here overnight get a DVT study in the morning having worked with physical therapy and Occupational Therapy in the morning.  He does have bilateral AFOs.  He does have some chronic neuropathy at baseline.  But at this time I do not see any acute indication for admission.  He does not use any assistive devices at home.  My hope is that with little bit of rest things will improve let him get a physical therapy evaluation in the morning DVT study in the morning and if unremarkable hopefully we can get him home.  He may need a walker.  Patient and boarder status.  This chart was dictated using voice recognition software.  Despite best efforts to proofread,  errors can occur which can change the documentation meaning.    Ruthe Cornet, DO 06/14/24 2203

## 2024-06-15 ENCOUNTER — Observation Stay (HOSPITAL_COMMUNITY)

## 2024-06-15 ENCOUNTER — Emergency Department (EMERGENCY_DEPARTMENT_HOSPITAL)

## 2024-06-15 DIAGNOSIS — M79661 Pain in right lower leg: Secondary | ICD-10-CM | POA: Diagnosis not present

## 2024-06-15 DIAGNOSIS — F840 Autistic disorder: Secondary | ICD-10-CM

## 2024-06-15 DIAGNOSIS — E118 Type 2 diabetes mellitus with unspecified complications: Secondary | ICD-10-CM | POA: Diagnosis not present

## 2024-06-15 DIAGNOSIS — E1151 Type 2 diabetes mellitus with diabetic peripheral angiopathy without gangrene: Secondary | ICD-10-CM | POA: Diagnosis not present

## 2024-06-15 DIAGNOSIS — Z751 Person awaiting admission to adequate facility elsewhere: Secondary | ICD-10-CM | POA: Diagnosis not present

## 2024-06-15 DIAGNOSIS — E785 Hyperlipidemia, unspecified: Secondary | ICD-10-CM | POA: Diagnosis present

## 2024-06-15 DIAGNOSIS — Z7901 Long term (current) use of anticoagulants: Secondary | ICD-10-CM | POA: Diagnosis not present

## 2024-06-15 DIAGNOSIS — Z91013 Allergy to seafood: Secondary | ICD-10-CM | POA: Diagnosis not present

## 2024-06-15 DIAGNOSIS — I4891 Unspecified atrial fibrillation: Secondary | ICD-10-CM | POA: Diagnosis not present

## 2024-06-15 DIAGNOSIS — I639 Cerebral infarction, unspecified: Secondary | ICD-10-CM | POA: Diagnosis not present

## 2024-06-15 DIAGNOSIS — G459 Transient cerebral ischemic attack, unspecified: Secondary | ICD-10-CM | POA: Diagnosis not present

## 2024-06-15 DIAGNOSIS — R29709 NIHSS score 9: Secondary | ICD-10-CM | POA: Diagnosis present

## 2024-06-15 DIAGNOSIS — I69391 Dysphagia following cerebral infarction: Secondary | ICD-10-CM | POA: Diagnosis not present

## 2024-06-15 DIAGNOSIS — I63422 Cerebral infarction due to embolism of left anterior cerebral artery: Secondary | ICD-10-CM | POA: Diagnosis present

## 2024-06-15 DIAGNOSIS — Z7984 Long term (current) use of oral hypoglycemic drugs: Secondary | ICD-10-CM | POA: Diagnosis not present

## 2024-06-15 DIAGNOSIS — Z888 Allergy status to other drugs, medicaments and biological substances status: Secondary | ICD-10-CM | POA: Diagnosis not present

## 2024-06-15 DIAGNOSIS — R1310 Dysphagia, unspecified: Secondary | ICD-10-CM | POA: Diagnosis present

## 2024-06-15 DIAGNOSIS — R29898 Other symptoms and signs involving the musculoskeletal system: Secondary | ICD-10-CM

## 2024-06-15 DIAGNOSIS — I10 Essential (primary) hypertension: Secondary | ICD-10-CM | POA: Diagnosis present

## 2024-06-15 DIAGNOSIS — Z794 Long term (current) use of insulin: Secondary | ICD-10-CM | POA: Diagnosis not present

## 2024-06-15 DIAGNOSIS — I480 Paroxysmal atrial fibrillation: Secondary | ICD-10-CM | POA: Diagnosis present

## 2024-06-15 DIAGNOSIS — E1142 Type 2 diabetes mellitus with diabetic polyneuropathy: Secondary | ICD-10-CM | POA: Diagnosis present

## 2024-06-15 DIAGNOSIS — K219 Gastro-esophageal reflux disease without esophagitis: Secondary | ICD-10-CM | POA: Diagnosis present

## 2024-06-15 DIAGNOSIS — E1165 Type 2 diabetes mellitus with hyperglycemia: Secondary | ICD-10-CM | POA: Diagnosis present

## 2024-06-15 DIAGNOSIS — F39 Unspecified mood [affective] disorder: Secondary | ICD-10-CM | POA: Diagnosis present

## 2024-06-15 DIAGNOSIS — Z9181 History of falling: Secondary | ICD-10-CM | POA: Diagnosis not present

## 2024-06-15 DIAGNOSIS — Z79899 Other long term (current) drug therapy: Secondary | ICD-10-CM | POA: Diagnosis not present

## 2024-06-15 DIAGNOSIS — Z91041 Radiographic dye allergy status: Secondary | ICD-10-CM | POA: Diagnosis not present

## 2024-06-15 DIAGNOSIS — G8191 Hemiplegia, unspecified affecting right dominant side: Secondary | ICD-10-CM | POA: Diagnosis present

## 2024-06-15 LAB — HEMOGLOBIN A1C
Hgb A1c MFr Bld: 8.7 % — ABNORMAL HIGH (ref 4.8–5.6)
Mean Plasma Glucose: 202.99 mg/dL

## 2024-06-15 LAB — CBG MONITORING, ED
Glucose-Capillary: 199 mg/dL — ABNORMAL HIGH (ref 70–99)
Glucose-Capillary: 226 mg/dL — ABNORMAL HIGH (ref 70–99)
Glucose-Capillary: 216 mg/dL — ABNORMAL HIGH (ref 70–99)
Glucose-Capillary: 223 mg/dL — ABNORMAL HIGH (ref 70–99)

## 2024-06-15 MED ORDER — ACETAMINOPHEN 160 MG/5ML PO SOLN: 650.0000 mg | ORAL | Status: DC | PRN

## 2024-06-15 MED ORDER — LORAZEPAM 2 MG/ML IJ SOLN
0.5000 mg | Freq: Once | INTRAMUSCULAR | Status: AC
  Administered 2024-06-15: 0.5 mg via INTRAVENOUS
  Filled 2024-06-15: qty 1

## 2024-06-15 MED ORDER — ACETAMINOPHEN 650 MG RE SUPP: 650.0000 mg | RECTAL | Status: DC | PRN

## 2024-06-15 MED ORDER — ACETAMINOPHEN 325 MG PO TABS: 650.0000 mg | ORAL_TABLET | ORAL | Status: DC | PRN

## 2024-06-15 MED ORDER — SENNOSIDES-DOCUSATE SODIUM 8.6-50 MG PO TABS: 1.0000 | ORAL_TABLET | Freq: Every evening | ORAL | Status: DC | PRN

## 2024-06-15 MED ORDER — DILTIAZEM HCL 30 MG PO TABS
30.0000 mg | ORAL_TABLET | Freq: Four times a day (QID) | ORAL | Status: DC
  Administered 2024-06-15 – 2024-06-17 (×6): 30 mg via ORAL
  Filled 2024-06-15 (×8): qty 1

## 2024-06-15 MED ORDER — STROKE: EARLY STAGES OF RECOVERY BOOK: Freq: Once | Status: DC

## 2024-06-15 MED ORDER — DILTIAZEM LOAD VIA INFUSION
15.0000 mg | Freq: Once | INTRAVENOUS | Status: AC
  Administered 2024-06-15: 15 mg via INTRAVENOUS
  Filled 2024-06-15: qty 15

## 2024-06-15 MED ORDER — ASPIRIN 81 MG PO TBEC: 81.0000 mg | DELAYED_RELEASE_TABLET | Freq: Every day | ORAL | Status: DC

## 2024-06-15 MED ORDER — DILTIAZEM HCL-DEXTROSE 125-5 MG/125ML-% IV SOLN (PREMIX)
5.0000 mg/h | INTRAVENOUS | Status: DC
  Administered 2024-06-15: 5 mg/h via INTRAVENOUS
  Administered 2024-06-16: 10 mg/h via INTRAVENOUS
  Administered 2024-06-16: 2.5 mg/h via INTRAVENOUS
  Administered 2024-06-16: 12.5 mg/h via INTRAVENOUS
  Administered 2024-06-16: 7.5 mg/h via INTRAVENOUS
  Administered 2024-06-16: 15 mg/h via INTRAVENOUS
  Administered 2024-06-16: 5 mg/h via INTRAVENOUS
  Filled 2024-06-15 (×2): qty 125

## 2024-06-15 MED ORDER — CLOBETASOL PROPIONATE 0.05 % EX CREA: 1.0000 | TOPICAL_CREAM | Freq: Two times a day (BID) | CUTANEOUS | Status: DC | PRN

## 2024-06-15 MED ORDER — SODIUM CHLORIDE 0.9 % IV SOLN: INTRAVENOUS | Status: AC

## 2024-06-15 NOTE — Evaluation (Signed)
 Occupational Therapy Evaluation Patient Details Name: Eric Cummings MRN: [id number] DOB: 1966/04/12 Today's Date: 06/15/2024   History of Present Illness   59 y.o. male presents to [facility] 06/14/24 with RLE weakness. MRI brain negative, EEG WNL. 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 He is incontinent and uses briefs. He is able to bathe, dress and feed himself with supervision. Pt lives with his sister and attends [employer]. Pt presents with R side weakness, zero sitting balance with R and anterior lean and poor standing balance with inability to weight shift onto R LE. Pt needs +2 assist for all mobility and min to total assist for ADLs. Patient will benefit from continued inpatient follow up therapy, <3 hours/day.     If plan is discharge home, recommend the following:   Two people to help with walking and/or transfers;A lot of help with bathing/dressing/bathroom;Assistance with cooking/housework;Assistance with feeding;Direct supervision/assist for medications management;Direct supervision/assist for financial management;Assist for transportation;Help with stairs or ramp for entrance     Functional Status Assessment   Patient has had a recent decline in their functional status and demonstrates the ability to make significant improvements in function in a reasonable and predictable amount of time.     Equipment Recommendations   Other (comment) (defer)     Recommendations for Other Services         Precautions/Restrictions   Precautions Precautions: Fall Recall of Precautions/Restrictions: Impaired Restrictions Weight Bearing Restrictions Per Provider Order: No     Mobility Bed Mobility Overal bed mobility: Needs Assistance Bed Mobility: Supine to Sit, Sit to Supine     Supine to sit: +2 for physical assistance, +2 for safety/equipment, HOB elevated, Max assist Sit to supine:  Total assist, +2 for physical assistance, +2 for safety/equipment   General bed mobility comments: unable to bring RLE off EOB with assist needed. MaxAx2 to raise trunk and accomodate for R lateral lean. TotalAx2 to return quickly to supine as pt was sliding anteriorly    Transfers Overall transfer level: Needs assistance Equipment used: Rolling walker (2 wheels) Transfers: Sit to/from Stand Sit to Stand: Mod assist, +2 physical assistance, +2 safety/equipment, Max assist           General transfer comment: assist needed to bring R hand to RW handle with difficulty grasping during stand. Slight assist to boost-up, however, heavy R lateral lean in standing. Attempted to take steps, however, pt unable to off-load LLE to take a step. One instance of BLE buckling with safe return to EOB.      Balance Overall balance assessment: Needs assistance, Mild deficits observed, not formally tested Sitting-balance support: Feet supported, Bilateral upper extremity supported Sitting balance-Leahy Scale: Zero Sitting balance - Comments: ModA to MaxA for seated balance with R lateral and occasional anterior trunk lean Postural control: Right lateral lean, Other (comment) (anterior) Standing balance support: Bilateral upper extremity supported, During functional activity, Reliant on assistive device for balance Standing balance-Leahy Scale: Poor Standing balance comment: ModAx2 for static balance, MaxAx2 for dynamic balance                           ADL either performed or assessed with clinical judgement   ADL Overall ADL's : Needs assistance/impaired Eating/Feeding: Minimal assistance;Bed level Eating/Feeding Details (indicate cue type and reason): cues to slow pace, primarily using L hand to lead Grooming: Wash/dry hands;Wash/dry face;Bed level;Set up  Upper Body Bathing: Total assistance;Sitting   Lower Body Bathing: Total assistance;Bed level   Upper Body Dressing : Maximal  assistance;Sitting   Lower Body Dressing: Total assistance;Bed level       Toileting- Clothing Manipulation and Hygiene: Total assistance;+2 for physical assistance;Sit to/from stand       Functional mobility during ADLs:  (unable to take steps)       Vision Ability to See in Adequate Light: 0 Adequate Patient Visual Report: No change from baseline Additional Comments: fields appear intact     Perception Perception: Not tested       Praxis Praxis: Not tested       Pertinent Vitals/Pain Pain Assessment Pain Assessment: Faces Faces Pain Scale: Hurts little more Pain Location: grimacing when attempting to weight-shift onto R LE Pain Descriptors / Indicators: Grimacing Pain Intervention(s): Limited activity within patient's tolerance, Repositioned     Extremity/Trunk Assessment Upper Extremity Assessment Upper Extremity Assessment: Right hand dominant;RUE deficits/detail RUE Deficits / Details: difficult to formally assess strength, appears to have shoulder weakness when asked to hold cup with R hand and finger feed, immediately uses L hand as lead extremity RUE Coordination: decreased fine motor;decreased gross motor   Lower Extremity Assessment Lower Extremity Assessment: Defer to PT evaluation RLE Deficits / Details: difficult to assess due to cognition. Would lean on R LE in standing, however, unable to off-load LLE to take steps. No spasticity/hypertonicity. WFL ROM RLE Coordination: decreased gross motor   Cervical / Trunk Assessment Cervical / Trunk Assessment: Other exceptions (weakness)   Communication Communication Communication: Impaired Factors Affecting Communication: Difficulty expressing self;Other (comment) (non verbal at baseline, uses thumbs up)   Cognition Arousal: Alert Behavior During Therapy: WFL for tasks assessed/performed Cognition: History of cognitive impairments                               Following commands:  Impaired Following commands impaired: Follows one step commands inconsistently, Follows one step commands with increased time     Cueing  General Comments   Cueing Techniques: Verbal cues;Tactile cues;Visual cues  Brother present and supportive during eval.   Exercises     Shoulder Instructions      Home Living Family/patient expects to be discharged to:: Private residence Living Arrangements: Other relatives (sister) Available Help at Discharge: Family;Personal care attendant;Available 24 hours/day Type of Home: House Home Access: Ramped entrance;Stairs to enter Entrance Stairs-Number of Steps: 5   Home Layout: One level     Bathroom Shower/Tub: Chief Strategy Officer: Standard Bathroom Accessibility: Yes   Home Equipment: None   Additional Comments: Attends day program. Has assist at home from family and attendant      Prior Functioning/Environment Prior Level of Function : Needs assist;History of Falls (last six months)             Mobility Comments: Per brother, pt was ModI with no AD for mobility with BAFOs. One fall leading to admit ADLs Comments: Supervision for bathing/dressing    OT Problem List: Decreased strength;Impaired balance (sitting and/or standing);Decreased coordination;Decreased cognition;Decreased knowledge of use of DME or AE;Impaired sensation;Impaired UE functional use   OT Treatment/Interventions: Self-care/ADL training;DME and/or AE instruction;Therapeutic activities;Patient/family education;Balance training;Neuromuscular education      OT Goals(Current goals can be found in the care plan section)   Acute Rehab OT Goals OT Goal Formulation: With family Time For Goal Achievement: 06/29/24 Potential to Achieve Goals: Good ADL Goals  Pt Will Perform Eating: with supervision;sitting (with R hand leading) Pt Will Perform Grooming: sitting;with supervision (supported sitting) Pt Will Perform Upper Body Dressing:  sitting Additional ADL Goal #1: Pt will complete bed mobility with mod assist in preparation for ADLs. Additional ADL Goal #2: Pt will demonstrate fair sitting balance x 5 minutes EOB as a precursor to ADLs. Additional ADL Goal #3: Pt will stand with +2 min assist and RW in preparation for toilet transfers.   OT Frequency:  Min 2X/week    Co-evaluation PT/OT/SLP Co-Evaluation/Treatment: Yes Reason for Co-Treatment: For patient/therapist safety;To address functional/ADL transfers;Necessary to address cognition/behavior during functional activity;Complexity of the patient's impairments (multi-system involvement) PT goals addressed during session: Mobility/safety with mobility;Balance;Proper use of DME OT goals addressed during session: ADL's and self-care      AM-PAC OT 6 Clicks Daily Activity     Outcome Measure Help from another person eating meals?: A Little Help from another person taking care of personal grooming?: A Little Help from another person toileting, which includes using toliet, bedpan, or urinal?: Total Help from another person bathing (including washing, rinsing, drying)?: Total Help from another person to put on and taking off regular upper body clothing?: A Lot Help from another person to put on and taking off regular lower body clothing?: Total 6 Click Score: 11   End of Session Equipment Utilized During Treatment: Gait belt;Rolling walker (2 wheels) Nurse Communication: Mobility status  Activity Tolerance: Patient tolerated treatment well Patient left: in bed;with call bell/phone within reach;with family/visitor present  OT Visit Diagnosis: Unsteadiness on feet (R26.81);Other abnormalities of gait and mobility (R26.89);Muscle weakness (generalized) (M62.81);Other symptoms and signs involving cognitive function                Time: 9150-9070 OT Time Calculation (min): 40 min Charges:  OT General Charges $OT Visit: 1 Visit OT Evaluation $OT Eval Moderate  Complexity: 1 Mod OT Treatments $Self Care/Home Management : 8-22 mins  Mliss HERO, OTR/L Acute Rehabilitation Services Office: [phone]   Kennth Mliss Helling 06/15/2024, 10:21 AM

## 2024-06-15 NOTE — Evaluation (Signed)
 Physical Therapy Evaluation Patient Details Name: Eric Cummings MRN: [id number] DOB: November 02, 1965 Today's Date: 06/15/2024  History of Present Illness  59 y.o. male presents to [facility] 06/14/24 with RLE weakness. MRI brain negative, EEG WNL. 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 PTA, pt would ambulate with BAFOs and no AD. Pt attends a day program with 24/7 assist when home from family and care attendant. Pt presents with R sided weakness, impaired coordination, and impaired static/dynamic balance. Pt required MaxAx2 to TotalAx2 for bed mobility. Once in seated position, pt had a heavy right lateral and occasional anterior trunk lean requiring ModA/MaxA for stability. Able to stand with ModAx2 and RW with difficulty keeping R hand on RW handle. Pt was leaning heavily to the right with inability to off-load LLE to take steps. One instance of BLE buckling with assist to lower to EOB. Pt then began to slide anteriorly with quick TotalAx2 to return to supine to prevent fall. Discussed post-acute rehab with pt's brother stating preference for <3hrs post acute rehab. Will continue to follow acutely.         If plan is discharge home, recommend the following: A lot of help with walking and/or transfers;A lot of help with bathing/dressing/bathroom;Assistance with cooking/housework;Assist for transportation;Help with stairs or ramp for entrance;Direct supervision/assist for medications management;Direct supervision/assist for financial management   Can travel by private vehicle   No    Equipment Recommendations Rolling walker (2 wheels);Wheelchair cushion (measurements PT);Wheelchair (measurements PT);BSC/3in1     Functional Status Assessment Patient has had a recent decline in their functional status and demonstrates the ability to make significant improvements in function in a reasonable and predictable  amount of time.     Precautions / Restrictions Precautions Precautions: Fall Recall of Precautions/Restrictions: Impaired Precaution/Restrictions Comments: BAFOs Restrictions Weight Bearing Restrictions Per Provider Order: No      Mobility  Bed Mobility Overal bed mobility: Needs Assistance Bed Mobility: Supine to Sit, Sit to Supine    Supine to sit: +2 for physical assistance, +2 for safety/equipment, HOB elevated, Max assist Sit to supine: Total assist, +2 for physical assistance, +2 for safety/equipment   General bed mobility comments: unable to bring RLE off EOB with assist needed. MaxAx2 to raise trunk and accomodate for R lateral lean. TotalAx2 to return quickly to supine as pt was sliding anteriorly    Transfers Overall transfer level: Needs assistance Equipment used: Rolling walker (2 wheels) Transfers: Sit to/from Stand Sit to Stand: Mod assist, +2 physical assistance, +2 safety/equipment, Max assist      General transfer comment: assist needed to bring R hand to RW handle with difficulty grasping during stand. Slight assist to boost-up, however, heavy R lateral lean in standing. Attempted to take steps, however, pt unable to off-load LLE to take a step. One instance of BLE buckling with safe return to EOB.       Balance Overall balance assessment: Needs assistance, Mild deficits observed, not formally tested Sitting-balance support: Feet supported, Bilateral upper extremity supported Sitting balance-Leahy Scale: Zero Sitting balance - Comments: ModA to MaxA for seated balance with R lateral and occasional anterior trunk lean Postural control: Right lateral lean, Other (comment) (anterior) Standing balance support: Bilateral upper extremity supported, During functional activity, Reliant on assistive device for balance Standing balance-Leahy Scale: Poor Standing balance comment: ModAx2 for static balance, MaxAx2 for dynamic balance       Pertinent Vitals/Pain  Pain  Assessment Pain Assessment: Faces Faces Pain Scale: Hurts little more Pain Location: grimacing when attempting to weight-shift onto R LE Pain Descriptors / Indicators: Aching, Discomfort Pain Intervention(s): Limited activity within patient's tolerance, Monitored during session, Repositioned    Home Living Family/patient expects to be discharged to:: Private residence Living Arrangements: Other relatives (brother and sister) Available Help at Discharge: Family;Personal care attendant;Available 24 hours/day Type of Home: House Home Access: Ramped entrance;Stairs to enter   Entrance Stairs-Number of Steps: 5   Home Layout: One level Home Equipment: None Additional Comments: Attends day program. Has assist at home from family and attendant    Prior Function Prior Level of Function : Needs assist;History of Falls (last six months)    Mobility Comments: Per brother, pt was ModI with no AD for mobility with BAFOs. One fall leading to admit ADLs Comments: Supervision for bathing/dressing     Extremity/Trunk Assessment   Upper Extremity Assessment Upper Extremity Assessment: Defer to OT evaluation    Lower Extremity Assessment Lower Extremity Assessment: RLE deficits/detail;Difficult to assess due to impaired cognition RLE Deficits / Details: difficult to assess due to cognition. Would lean on R LE in standing, however, unable to off-load LLE to take steps. No spasticity/hypertonicity. WFL ROM RLE Coordination: decreased gross motor    Cervical / Trunk Assessment Cervical / Trunk Assessment: Normal  Communication   Communication Communication: Impaired Factors Affecting Communication: Difficulty expressing self;Other (comment) (nonverbal at baseline)    Cognition Arousal: Alert Behavior During Therapy: WFL for tasks assessed/performed   PT - Cognitive impairments: History of cognitive impairments    PT - Cognition Comments: nonverbal at baseline, will use thumbs up and  occasionally follow commands with increased time Following commands: Impaired Following commands impaired: Follows one step commands inconsistently, Follows one step commands with increased time     Cueing Cueing Techniques: Verbal cues, Tactile cues, Visual cues     General Comments General comments (skin integrity, edema, etc.): Brother present and supportive during eval.     PT Assessment Patient needs continued PT services  PT Problem List Decreased strength;Decreased mobility;Decreased activity tolerance;Decreased balance;Decreased cognition;Decreased knowledge of use of DME;Decreased safety awareness       PT Treatment Interventions DME instruction;Gait training;Stair training;Functional mobility training;Therapeutic activities;Therapeutic exercise;Balance training;Neuromuscular re-education;Patient/family education    PT Goals (Current goals can be found in the Care Plan section)  Acute Rehab PT Goals Patient Stated Goal: unable to state goal PT Goal Formulation: With patient/family Time For Goal Achievement: 06/29/24 Potential to Achieve Goals: Good    Frequency Min 3X/week     Co-evaluation   Reason for Co-Treatment: For patient/therapist safety;To address functional/ADL transfers;Necessary to address cognition/behavior during functional activity;Complexity of the patient's impairments (multi-system involvement) PT goals addressed during session: Mobility/safety with mobility;Balance;Proper use of DME         AM-PAC PT 6 Clicks Mobility  Outcome Measure Help needed turning from your back to your side while in a flat bed without using bedrails?: A Lot Help needed moving from lying on your back to sitting on the side of a flat bed without using bedrails?: Total Help needed moving to and from a bed to a chair (including a wheelchair)?: Total Help needed standing up from a chair using your arms (e.g., wheelchair or bedside chair)?: Total Help needed to walk in  hospital room?: Total Help needed climbing 3-5 steps with a railing? : Total 6 Click Score: 7    End of Session Equipment Utilized During Treatment: Gait belt Activity  Tolerance: Patient tolerated treatment well Patient left: in bed;with call bell/phone within reach;with family/visitor present Nurse Communication: Mobility status PT Visit Diagnosis: Unsteadiness on feet (R26.81);Other abnormalities of gait and mobility (R26.89);Muscle weakness (generalized) (M62.81);History of falling (Z91.81)    Time: 9149-9074 PT Time Calculation (min) (ACUTE ONLY): 35 min   Charges:   PT Evaluation $PT Eval Moderate Complexity: 1 Mod   PT General Charges $$ ACUTE PT VISIT: 1 Visit       Kate ORN, PT, DPT Secure Chat Preferred  Rehab Office [phone]   Kate BRAVO Wendolyn 06/15/2024, 10:10 AM

## 2024-06-15 NOTE — Progress Notes (Signed)
 RLE venous duplex has been completed.  Preliminary results given to Dr. Ruthe.   Results can be found under chart review under CV PROC. 06/15/2024 11:55 AM Rondrick Barreira RVT, RDMS

## 2024-06-15 NOTE — [place] FL2 (Signed)
 " [place]  MEDICAID FL2 LEVEL OF CARE FORM     IDENTIFICATION  Patient Name: Eric Cummings Birthdate: [birth year] Sex: male Admission Date (Current Location): 06/14/2024  [facility] and [place] Number:  Producer, Television/film/video and Address:  [facility] [place], [address] [place], [place] [address]      Provider Number: [id number]  Attending Physician Name and Address:  Ruthe Cornet, DO  Relative Name and Phone Number:  Eric Cummings, Eric Cummings, [phone]    Current Level of Care: Hospital Recommended Level of Care: Skilled Nursing Facility Prior Approval Number:    Date Approved/Denied:   PASRR Number: [id number]  Discharge Plan: SNF    Current Diagnoses: Patient Active Problem List   Diagnosis Date Noted   Urinary incontinence 10/07/2021   Nocturia 05/15/2021   Encounter for screening for other disorder 04/04/2018   Diabetic peripheral neuropathy associated with type 2 diabetes mellitus (HCC) 01/08/2016   Diabetic renal disease (HCC) 01/08/2016   Adult failure to thrive syndrome 08/16/2015   Constipation 05/17/2015   Polyneuropathy 05/17/2015   Tinea unguium 05/17/2015   Cardiac arrhythmia 01/25/2015   Long term (current) use of insulin  (HCC) 01/25/2015   Abnormal gait 09/14/2014   Type 1 diabetes mellitus without complications (HCC) 09/14/2014   Cough 11/30/2013   Syncope and collapse 08/21/2013   Impacted cerumen 07/29/2012   Underimmunization status 03/01/2012   Otalgia 11/27/2011   Gastro-esophageal reflux disease without esophagitis 06/20/2010   Allergic rhinitis 02/28/2010   Dental caries 06/07/2009   Hyperglycemia due to type 2 diabetes mellitus (HCC) 06/07/2009   Autistic disorder 05/28/2009   Dermatitis 05/28/2009   Essential hypertension 05/28/2009   Hyperlipidemia 05/28/2009   Obesity 05/28/2009   Profound intellectual disabilities 05/28/2009    Orientation RESPIRATION BLADDER Height & Weight     Self, Time,  Situation, Place  Normal Continent Weight: 163 lb 2.3 oz (74 kg) Height:  5' 10 (177.8 cm)  BEHAVIORAL SYMPTOMS/MOOD NEUROLOGICAL BOWEL NUTRITION STATUS      Continent Diet (see dc summary)  AMBULATORY STATUS COMMUNICATION OF NEEDS Skin   Limited Assist Non-Verbally Normal                       Personal Care Assistance Level of Assistance  Bathing, Feeding, Dressing Bathing Assistance: Limited assistance Feeding assistance: Limited assistance Dressing Assistance: Limited assistance     Functional Limitations Info  Sight, Speech, Hearing Sight Info: Adequate Hearing Info: Adequate Speech Info: Adequate    SPECIAL CARE FACTORS FREQUENCY  PT (By licensed PT), OT (By licensed OT)     PT Frequency: 5x/wk OT Frequency: 5x/wk            Contractures Contractures Info: Not present    Additional Factors Info  Code Status, Allergies Code Status Info: Full code Allergies Info: Ace Inhibitors  Iodine  Shellfish Allergy   Shellfish Protein-containing Drug Products  Simvastatin           Current Medications (06/15/2024):  This is the current hospital active medication list Current Facility-Administered Medications  Medication Dose Route Frequency Provider Last Rate Last Admin   amLODipine  (NORVASC ) tablet 10 mg  10 mg Oral Daily Curatolo, Adam, DO   10 mg at 06/15/24 1101   empagliflozin  (JARDIANCE ) tablet 10 mg  10 mg Oral Daily Curatolo, Adam, DO   10 mg at 06/15/24 1101   fenofibrate  tablet 160 mg  160 mg Oral Daily Curatolo, Adam, DO   160 mg at 06/15/24  1100   gabapentin  (NEURONTIN ) capsule 100 mg  100 mg Oral QPC breakfast Curatolo, Adam, DO   100 mg at 06/15/24 1100   gabapentin  (NEURONTIN ) capsule 300 mg  300 mg Oral QHS Curatolo, Adam, DO   300 mg at 06/14/24 2240   insulin  aspart (novoLOG ) injection 0-15 Units  0-15 Units Subcutaneous TID WC Curatolo, Adam, DO   3 Units at 06/15/24 1101   insulin  aspart (novoLOG ) injection 0-5 Units  0-5 Units Subcutaneous QHS  Curatolo, Adam, DO   3 Units at 06/14/24 2239   irbesartan  (AVAPRO ) tablet 150 mg  150 mg Oral Daily Curatolo, Adam, DO   150 mg at 06/15/24 1102   linagliptin  (TRADJENTA ) tablet 5 mg  5 mg Oral Daily Curatolo, Adam, DO   5 mg at 06/15/24 1101   metFORMIN  (GLUCOPHAGE -XR) 24 hr tablet 500 mg  500 mg Oral BID WC Curatolo, Adam, DO   500 mg at 06/15/24 1100   pantoprazole  (PROTONIX ) EC tablet 40 mg  40 mg Oral Daily Curatolo, Adam, DO   40 mg at 06/15/24 1101   sertraline  (ZOLOFT ) tablet 100 mg  100 mg Oral Daily Curatolo, Adam, DO   100 mg at 06/15/24 1100   Current Outpatient Medications  Medication Sig Dispense Refill   amLODipine  (NORVASC ) 10 MG tablet Take 10 mg by mouth daily.     cetirizine  (ZYRTEC  ALLERGY ) 10 MG tablet Take 1 tablet (10 mg total) by mouth 2 (two) times daily as needed (itching, allergies). (Patient taking differently: Take 10 mg by mouth 3 times/day as needed-between meals & bedtime (itching, allergies).) 60 tablet 5   clobetasol  cream (TEMOVATE ) 0.05 % Apply 1 Application topically 2 (two) times daily as needed (breakout).     empagliflozin  (JARDIANCE ) 10 MG TABS tablet Take 1 tablet by mouth daily.     EPINEPHrine  0.3 mg/0.3 mL IJ SOAJ injection Inject 0.3 mg into the muscle as needed for anaphylaxis. 2 each 1   fenofibrate  160 MG tablet Take 160 mg by mouth daily.     Fluocinolone  Acetonide Body (DERMA-SMOOTHE /FS BODY) 0.01 % OIL Apply once a day on the whole body. Once rash/itching clears up only use up to twice per week. 118 mL 3   gabapentin  (NEURONTIN ) 100 MG capsule TK 1 C PO IN MORNING AND 3 CAPSULE HS     insulin  glargine (LANTUS ) 100 UNIT/ML injection Inject 25 Units into the skin at bedtime.     insulin  lispro (HUMALOG) 100 UNIT/ML injection INJECT UNDER SKIN 0 UNIT(0-150), 3 UNITS(151-200), 5 UNITS(201-300) AND 7 UNITS IF GREATER THAN 301.     metFORMIN  (GLUCOPHAGE -XR) 500 MG 24 hr tablet TK 2 TS PO QAM AND 1 T QPM     NONFORMULARY OR COMPOUNDED ITEM [facility]  Pharmacy compound:  Peripheral Neuropathy cream - Bupivacaine 1%, Doxepin 3%, Gabapentin  6%, Pentoxifylline 3%, Topiramate 1%, dispense 120 grams, apply 1-2 grams to affected area 3-4 times daily, +2refills. 120 each 2   olmesartan (BENICAR) 20 MG tablet Take 20 mg by mouth daily.     omeprazole (PRILOSEC) 20 MG capsule Take 20 mg by mouth daily.     polyethylene glycol (MIRALAX) 17 g packet Take 17 g by mouth 2 (two) times a week.     sertraline  (ZOLOFT ) 100 MG tablet Take 100 mg by mouth daily.     sitaGLIPtin (JANUVIA) 100 MG tablet Take 1 tablet by mouth daily.     triazolam (HALCION) 0.125 MG tablet TAKE 3 TABLETS BY MOUTH PRIOR TO DENTAL  PROCEDURE AS DIRECTED BY DENTAL STAFF  0     Discharge Medications: Please see discharge summary for a list of discharge medications.  Relevant Imaging Results:  Relevant Lab Results:   Additional Information SSN [id number]  Sheri ONEIDA Sharps, LCSW     "

## 2024-06-15 NOTE — Consult Note (Addendum)
 "  Cardiology Consultation   Patient ID: Eric Cummings MRN: [id number]; DOB: [birth year]  Admit date: 06/14/2024 Date of Consult: 06/15/2024  PCP:  Onita Rush, MD   [facility] Providers Cardiologist:  Dr. Kriste    Patient Profile: Eric Cummings is a 59 y.o. male with a hx of autism who is non-verbal at baseline, DM who is being seen 06/15/2024 for the evaluation of atrial fibrillation at the request of Dr. Cherlyn.  History of Present Illness: Mr. Eric Cummings is a 59 year old male with history of autism who is nonverbal at baseline, diabetes and peripheral neuropathy.  Been evaluated by cardiology in the past.  He was brought to the ED on 1/14 from an adult daycare facility for acute onsets of right sided leaning and weakness, activated as a code stroke in the field.  Evaluated by neurology.  CT head with no acute process.  Brain MRI negative for acute intracranial pathology, but was an incomplete study.   Labs in the ED showed sodium 143, potassium 5.3, creatinine 1.2, AST 19.5, WBC 7.2, hemoglobin 15.  EKG on admission showed sinus rhythm, 70 bpm.   Initially planned for discharge after being seen by PT with plans for SNF place but developed atrial fibrillation 1/15. [facility] called for admission and cardiology asked to evaluate.   In talking with brother at the bedside he reports prior to this event he has been able to ambulate independently with AFOs. Was weak in the right leg yesterday but they noted weakness in the right arm today.    Past Medical History:  Diagnosis Date   Diabetes mellitus without complication (HCC)    Eczema    Urticaria     No past surgical history on file.   Scheduled Meds:  amLODipine   10 mg Oral Daily   empagliflozin   10 mg Oral Daily   fenofibrate   160 mg Oral Daily   gabapentin   100 mg Oral QPC breakfast   gabapentin   300 mg Oral QHS   insulin  aspart  0-15 Units Subcutaneous TID WC   insulin  aspart  0-5 Units Subcutaneous QHS    irbesartan   150 mg Oral Daily   linagliptin   5 mg Oral Daily   metFORMIN   500 mg Oral BID WC   pantoprazole   40 mg Oral Daily   sertraline   100 mg Oral Daily   Continuous Infusions:  diltiazem  (CARDIZEM ) infusion 5 mg/hr (06/15/24 1553)   PRN Meds:   Allergies:   Allergies[1]  Social History:   Social History   Socioeconomic History   Marital status: Single    Spouse name: Not on file   Number of children: Not on file   Years of education: Not on file   Highest education level: Not on file  Occupational History   Not on file  Tobacco Use   Smoking status: Never   Smokeless tobacco: Never  Substance and Sexual Activity   Alcohol use: No   Drug use: No   Sexual activity: Not on file  Other Topics Concern   Not on file  Social History Narrative   Not on file   Social Drivers of Health   Tobacco Use: Low Risk (04/17/2024)   Patient History    Smoking Tobacco Use: Never    Smokeless Tobacco Use: Never    Passive Exposure: Not on file  Financial Resource Strain: Not on file  Food Insecurity: Not on file  Transportation Needs: Not on file  Physical Activity: Not on file  Stress: Not on file  Social Connections: Not on file  Intimate Partner Violence: Not on file  Depression (EYV7-0): Not on file  Alcohol Screen: Not on file  Housing: Not on file  Utilities: Not on file  Health Literacy: Not on file    Family History:    Family History  Problem Relation Age of Onset   Allergic rhinitis Mother    Asthma Father    Allergic rhinitis Father    Eczema Sister      ROS:  Please see the history of present illness.   All other ROS reviewed and negative.     Physical Exam/Data: Vitals:   06/14/24 1923 06/14/24 2015 06/14/24 2128 06/15/24 1206  BP: (!) 146/99 136/78 132/87 (!) 152/81  Pulse: 80 74 76 80  Resp: 16 20 14 20   Temp: 98.4 F (36.9 C)  98.2 F (36.8 C) 99.5 F (37.5 C)  TempSrc: Oral  Oral Oral  SpO2: 95% 100% 99% 96%  Weight:      Height:        No intake or output data in the 24 hours ending 06/15/24 1630    06/14/2024    2:31 PM 06/14/2024    2:00 PM 03/20/2024    2:34 PM  Last 3 Weights  Weight (lbs) 163 lb 2.3 oz 161 lb 13.1 oz 161 lb 6.4 oz  Weight (kg) 74 kg 73.4 kg 73.211 kg     Body mass index is 23.41 kg/m.  General:  Thin, AAM HEENT: normal Neck: no JVD Vascular: Distal pulses 2+ bilaterally Cardiac:  normal S1, S2; Irreg Irreg; no murmur  Lungs:  clear to auscultation bilaterally Abd: soft, nontender, no hepatomegaly  Ext: no edema Musculoskeletal:  weakness in the right U/L extremities Skin: warm and dry  Psych: non-verbal  EKG:  The EKG was personally reviewed and demonstrates:  1/15 atrial fibrillation RVR 153 bpm Telemetry:  Telemetry was personally reviewed and demonstrates:  initially sinus rhythm with conversion to atrial fibrillation the afternoon of 1/15  Relevant CV Studies:  N/a   Laboratory Data: High Sensitivity Troponin:  No results for input(s): TROPONINIHS in the last 720 hours. No results for input(s): TRNPT in the last 720 hours.    Chemistry Recent Labs  Lab 06/14/24 1400 06/14/24 1402  NA 143 142  K 5.3* 5.1  CL 103 103  CO2 29  --   GLUCOSE 260* 251*  BUN 34* 41*  CREATININE 1.22 1.30*  CALCIUM 9.9  --   GFRNONAA >60  --   ANIONGAP 11  --     Recent Labs  Lab 06/14/24 1400  PROT 7.7  ALBUMIN 4.5  AST 36  ALT 45*  ALKPHOS 113  BILITOT 0.5   Lipids No results for input(s): CHOL, TRIG, HDL, LABVLDL, LDLCALC, CHOLHDL in the last 168 hours.  Hematology Recent Labs  Lab 06/14/24 1400 06/14/24 1402  WBC 7.2  --   RBC 5.32  --   HGB 15.0 16.3  HCT 46.9 48.0  MCV 88.2  --   MCH 28.2  --   MCHC 32.0  --   RDW 13.2  --   PLT 246  --    Thyroid  No results for input(s): TSH, FREET4 in the last 168 hours.  BNPNo results for input(s): BNP, PROBNP in the last 168 hours.  DDimer No results for input(s): DDIMER in the last 168  hours.  Radiology/Studies:  CT CERVICAL SPINE WO CONTRAST Result Date: 06/15/2024 CLINICAL DATA:  [place]  EXAM: CT CERVICAL SPINE WITHOUT CONTRAST TECHNIQUE: Multidetector CT imaging of the cervical spine was performed without intravenous contrast. Multiplanar CT image reconstructions were also generated. RADIATION DOSE REDUCTION: This exam was performed according to the departmental dose-optimization program which includes automated exposure control, adjustment of the mA and/or kV according to patient size and/or use of iterative reconstruction technique. COMPARISON:  None Available. FINDINGS: Alignment: Alignment is anatomic. Skull base and vertebrae: No acute fracture. No primary bone lesion or focal pathologic process. Soft tissues and spinal canal: No prevertebral fluid or swelling. No visible canal hematoma. Disc levels: Mild multilevel spondylosis most pronounced at C3-4. Mild right predominant facet hypertrophy from C3-4 through C6-7. No significant bony encroachment upon the central canal or neural foramina. Upper chest: Airway is patent.  Lung apices are clear. Other: Reconstructed images demonstrate no additional findings. IMPRESSION: 1. No acute cervical spine fracture. 2. Mild multilevel cervical degenerative changes. Electronically Signed   By: Ozell Daring M.D.   On: 06/15/2024 16:18   VAS US  LOWER EXTREMITY VENOUS (DVT) (7a-5p) Result Date: 06/15/2024  Lower Venous DVT Study Patient Name:  Eric Cummings  Date of Exam:   06/15/2024 Medical Rec #: [id number]            Accession #:    [id number] Date of Birth: 06/13/1965             Patient Gender: M Patient Age:   25 years Exam Location:  [facility] Procedure:      VAS US  LOWER EXTREMITY VENOUS (DVT) Referring Phys: ADAM CURATOLO --------------------------------------------------------------------------------  Indications: Pain.  Limitations: Suboptimal positioning. Comparison Study: No previous exams Performing Technologist: Jody  Hill RVT, RDMS  Examination Guidelines: A complete evaluation includes B-mode imaging, spectral Doppler, color Doppler, and power Doppler as needed of all accessible portions of each vessel. Bilateral testing is considered an integral part of a complete examination. Limited examinations for reoccurring indications may be performed as noted. The reflux portion of the exam is performed with the patient in reverse Trendelenburg.  +---------+---------------+---------+-----------+----------+--------------+ RIGHT    CompressibilityPhasicitySpontaneityPropertiesThrombus Aging +---------+---------------+---------+-----------+----------+--------------+ CFV      Full           Yes      Yes                                 +---------+---------------+---------+-----------+----------+--------------+ SFJ      Full                                                        +---------+---------------+---------+-----------+----------+--------------+ FV Prox  Full           Yes      Yes                                 +---------+---------------+---------+-----------+----------+--------------+ FV Mid   Full           Yes      Yes                                 +---------+---------------+---------+-----------+----------+--------------+ FV DistalFull           Yes  Yes                                 +---------+---------------+---------+-----------+----------+--------------+ PFV      Full                                                        +---------+---------------+---------+-----------+----------+--------------+ POP      Full           Yes      Yes                                 +---------+---------------+---------+-----------+----------+--------------+ PTV      Full                                                        +---------+---------------+---------+-----------+----------+--------------+ PERO     Full                                                         +---------+---------------+---------+-----------+----------+--------------+   +----+---------------+---------+-----------+----------+--------------+ LEFTCompressibilityPhasicitySpontaneityPropertiesThrombus Aging +----+---------------+---------+-----------+----------+--------------+ CFV Full           Yes      Yes                                 +----+---------------+---------+-----------+----------+--------------+     Summary: RIGHT: - There is no evidence of deep vein thrombosis in the lower extremity.  - No cystic structure found in the popliteal fossa.  LEFT: - No evidence of common femoral vein obstruction.   *See table(s) above for measurements and observations. Electronically signed by Debby Robertson on 06/15/2024 at 2:12:55 PM.    Final    DG Lumbar Spine Complete Result Date: 06/14/2024 EXAM: 4 VIEW(S) XRAY OF THE LUMBAR SPINE 06/14/2024 06:25:00 PM COMPARISON: None available. CLINICAL HISTORY: PAIN PAIN PAIN FINDINGS: LUMBAR SPINE: BONES: Vertebral body heights are maintained. Alignment is normal. DISCS AND DEGENERATIVE CHANGES: Moderate facet arthrosis L3 - S1. SOFT TISSUES: No acute abnormality. IMPRESSION: 1. 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 Electronically signed by: Dorethia Molt MD 06/14/2024 06:29 PM EST RP Workstation: [id number]   DG Femur Portable Min 2 Views Right Result Date: 06/14/2024 EXAM: 2 VIEW(S) XRAY OF THE RIGHT FEMUR 06/14/2024 04:52:00 PM COMPARISON: None available. CLINICAL HISTORY: right hip? right hip? right hip? FINDINGS: BONES AND JOINTS: No acute fracture. No malalignment. SOFT TISSUES: Mild vascular calcifications. IMPRESSION: 1. No acute findings. Electronically signed by: Morgane Naveau MD 06/14/2024 05:03 PM EST RP Workstation: [id number]   DG Tibia/Fibula Right Result Date: 06/14/2024 CLINICAL DATA:  Possible right lower leg abnormality. No known injury. EXAM: RIGHT TIBIA AND FIBULA - 2 VIEW COMPARISON:  None Available. FINDINGS: There is no  evidence of fracture or other focal bone lesions. Soft tissues are unremarkable. IMPRESSION: Negative. Electronically Signed   By: Toribio  Jearld M.D.   On: 06/14/2024 17:01   DG Pelvis Portable Result Date: 06/14/2024 CLINICAL DATA:  Right hip pain. EXAM: PORTABLE PELVIS 1-2 VIEWS COMPARISON:  None Available. FINDINGS: No acute fracture or dislocation on this single image. There is heterogeneous density over the right femoral head. The bones are well mineralized. No significant arthritic changes. The soft tissues are unremarkable. IMPRESSION: No acute fracture or dislocation. Electronically Signed   By: Vanetta Chou M.D.   On: 06/14/2024 16:58   EEG adult Result Date: 06/14/2024 Shelton Arlin KIDD, MD     06/14/2024  4:39 PM Patient Name: BLAIN HUNSUCKER MRN: [id number] Epilepsy Attending: Arlin KIDD Shelton Referring Physician/Provider: Waddell Karna LABOR, NP Date: 06/15/2023 Duration: 22.56 mins Patient history: 59 y.o. 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 EEG to evaluate for seizure Level of alertness: Awake AEDs during EEG study: Ativan  Technical aspects: This EEG study was done with scalp electrodes positioned according to the 10-20 International system of electrode placement. Electrical activity was reviewed with band pass filter of 1-70Hz , sensitivity of 7 uV/mm, display speed of 19mm/sec with a 60Hz  notched filter applied as appropriate. EEG data were recorded continuously and digitally stored.  Video monitoring was available and reviewed as appropriate. Description: The posterior dominant rhythm consists of 10 Hz activity of moderate voltage (25-35 uV) seen predominantly in posterior head regions, symmetric and reactive to eye opening and eye closing. There is an excessive amount of 15 to 18 Hz beta activity distributed symmetrically and  diffusely. Hyperventilation and photic stimulation were not performed.   ABNORMALITY - Excessive beta, generalized IMPRESSION: This study is within normal limits. The excessive beta activity seen in the background is most likely due to the effect of benzodiazepine and is a benign EEG pattern. No seizures or epileptiform discharges were seen throughout the recording. A normal interictal EEG does not exclude the diagnosis of epilepsy. Arlin KIDD Shelton   MR BRAIN WO CONTRAST Result Date: 06/14/2024 HISTORY: stroke EXAM: MR BRAIN WITHOUT CONTRAST TECHNIQUE: Multiplanar, multiecho pulse sequences of the brain and surrounding structures were obtained without intravenous contrast. Axial and coronal DWI, axial FLAIR, and axial GRE sequences were obtained. COMPARISON:  Same date CT head FINDINGS: The study is incomplete as the patient could not tolerate completing the exam. Axial and coronal DWI, motion degraded axial FLAIR, and axial GRE sequences were obtained. There is no evidence of acute infarct on the axial DWI sequence. There is no definite evidence of acute intracranial hemorrhage. Parenchymal volume is within normal limits. The ventricles are normal in size. Mild FLAIR signal abnormality in the periventricular white matter is nonspecific but may reflect mild underlying chronic small vessel ischemic change. There is no definite mass lesion. There is no mass effect or midline shift. IMPRESSION: 1. Incomplete study with axial and coronal DWI, motion degraded axial FLAIR, and axial GRE sequences obtained. 2.  No evidence of acute intracranial pathology on the provided images. Electronically signed by: Maude Harry MD 06/14/2024 03:27 PM EST RP Workstation: [id number]   CT HEAD CODE STROKE WO CONTRAST Result Date: 06/14/2024 EXAM: CT HEAD WITHOUT CONTRAST 06/14/2024 02:06:59 PM TECHNIQUE: CT of the head was performed without the administration of intravenous contrast. Automated exposure control, iterative  reconstruction, and/or weight based adjustment of the mA/kV was utilized to reduce the radiation dose to as low as reasonably  achievable. COMPARISON: None available. CLINICAL HISTORY: The patient presents with an acute neurological deficit, and a stroke is suspected. FINDINGS: BRAIN AND VENTRICLES: No acute hemorrhage. No evidence of acute infarct. No hydrocephalus. No extra-axial collection. No mass effect or midline shift. [place] Stroke Program Early CT (ASPECT) score: Ganglionic (caudate, IC, lentiform nucleus, insula, M1-M3): 7 Supraganglionic (M4-M6): 3 Total: 10 ORBITS: No acute abnormality. SINUSES: Polyps versus mucous retention cysts in the maxillary sinuses. SOFT TISSUES AND SKULL: No acute soft tissue abnormality. No skull fracture. IMPRESSION: 1. No acute intracranial abnormality. 2. ASPECT score is 10. 3. Findings messaged to Dr. Rosemarie via the [facility] messaging system at 2:24 PM on 06/14/24. Electronically signed by: Donnice Mania MD 06/14/2024 02:24 PM EST RP Workstation: [id number]    Assessment and Plan:  Eric Cummings is a 58 y.o. male with a hx of autism who is non-verbal at baseline, DM who is being seen 06/15/2024 for the evaluation of atrial fibrillation at the request of Dr. Cherlyn.  New onset atrial fibrillation  -- initial EKG showed sinus rhythm, with repeat EKG this afternoon with atrial fibrillation RVR 153 bpm -- has been started on IV diltiazem  with 15mg  bolus x1, improved HR -- MRI was incomplete but felt to be ok per neurology yesterday. Now with worsening symptoms( right sided weakness) repeat MRI pending -- will start Eliquis  5mg  BID, ChadsVasc would be a 3 if stroke confirmed  -- check echo   Right sided weakness -- initially called a Code Stroke, seen by neurology  -- as above, MRI incomplete but felt to show no acute stroke per neurology -- worsening right sided weakness, initially in the right leg now in the right arm  -- repeat MRI pending   DM -- on  insulin  PTA -- Hgb A1c 8.7 -- per primary  Risk Assessment/Risk Scores:   CHA2DS2-VASc Score = 1 though would be a 3 if stroke confirmed   This indicates a 0.6% annual risk of stroke. The patient's score is based upon: CHF History: 0 HTN History: 0 Diabetes History: 1 Stroke History: 0 Vascular Disease History: 0 Age Score: 0 Gender Score: 0     For questions or updates, please contact [facility] Please consult [email or web address] for contact info under      Signed, Manuelita Rummer, NP  06/15/2024 4:30 PM  Patient seen and examined, note reviewed with the signed Advanced Practice Provider. I personally reviewed laboratory data, imaging studies and relevant notes. I independently examined the patient and formulated the important aspects of the plan. I have personally discussed the plan with the patient and/or family. Comments or changes to the note/plan are indicated below.  Patient profile: This is a 59 year old male with past medical history of autism spectrum disorder and is nonverbal at baseline, diabetes who initially presented as a code stroke due to acute right-sided leaning and weakness.  He had a brain MRI was negative however there was motion artifact.  On 06/15/2024 patient converted from normal sinus rhythm to atrial fibrillation with RVR which is a new diagnosis for him and prompted cardiology consult.    My Exam:  Physical Exam Vitals and nursing note reviewed. Exam conducted with a chaperone present.  Constitutional:      Comments: Laying in bed, nonverbal  Cardiovascular:     Rate and Rhythm: Tachycardia present. Rhythm irregular.  Pulmonary:     Effort: Pulmonary effort is normal. No respiratory distress.  Musculoskeletal:     Right lower leg: No edema.  Left lower leg: No edema.  Neurological:     Mental Status: He is alert.      Telemetry: Atrial fibrillation with RVR- Personally reviewed EKG: Atrial fibrillation with RVR- Personally  reviewed   Assessment & Plan:  New onset A-fib with RVR-CHA2DS2-VASc: At least 1 for diabetes but potentially as high as 3 if patient is deemed to have had a TIA or stroke.  Continue with diltiazem  drip for now and start on diltiazem  30 mg every 6 hours p.o. and wean off drip as able.  Start Eliquis  5 mg twice daily for now would recommend a 1 month outpatient monitor versus loop recorder to evaluate A-fib burden.  Will check an echocardiogram with Definity contrast to evaluate for thrombus. Right-sided weakness-repeat MRI ordered Diabetes Autism spectrum, nonverbal at baseline   Signed, Emeline Calender, DO [place]  [facility]  06/15/2024 4:36 PM        [1]  Allergies Allergen Reactions   Ace Inhibitors     Other reaction(s): cough Other reaction(s): cough   Iodine     Other reaction(s): Unknown Other reaction(s): Unknown   Shellfish Allergy      Other reaction(s): Unknown   Shellfish Protein-Containing Drug Products     Other reaction(s): Unknown Other reaction(s): Unknown   Simvastatin     Other reaction(s): intolerance to Other reaction(s): intolerance to   "

## 2024-06-15 NOTE — H&P (Signed)
 " History and Physical    Patient: Eric Cummings FMW:[id number] DOB: [birth year] DOA: 06/14/2024 DOS: the patient was seen and examined on 06/15/2024 PCP: Onita Rush, MD  Patient coming from: Home  Chief Complaint:  Chief Complaint  Patient presents with   Code Stroke   HPI: Eric Cummings is a 59 y.o. 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  Patient is non verbal and unable to get any history. Patient is able to follow some simple commands. Acute stroke ruled out with a negative CT head. Mri Brain was incomplete study due to motion degradation.  He had PT eval  and was to be discharged to SNF when bed available.  But this morning patient went into afib with RVR with rates in 150/min and he was referred to [facility] for admission.  Neurology recommended getting a repeat MRI brain and CT cervical spine before starting anti coagulation.  Family at bedside and was able to provide the above history.   Cardiology and Neurology on board.  [facility] admitted for admission.    Review of Systems: unable to review all systems due to the inability of the patient to answer questions. Past Medical History:  Diagnosis Date   Diabetes mellitus without complication (HCC)    Eczema    Urticaria    No past surgical history on file. Social History:  reports that he has never smoked. He has never used smokeless tobacco. He reports that he does not drink alcohol and does not use drugs.  Allergies[1]  Family History  Problem Relation Age of Onset   Allergic rhinitis Mother    Asthma Father    Allergic rhinitis Father    Eczema Sister     Prior to Admission medications  Medication Sig Start Date End Date Taking? Authorizing Provider  amLODipine  (NORVASC ) 10 MG tablet Take 10 mg by mouth daily.   Yes [provider]   cetirizine  (ZYRTEC  ALLERGY ) 10 MG tablet Take 1 tablet (10 mg total) by mouth 2 (two) times daily as needed (itching, allergies). Patient taking differently: Take 10 mg by mouth 3 times/day as needed-between meals & bedtime (itching, allergies). 04/17/24  Yes Luke Orlan HERO, DO  clobetasol  cream (TEMOVATE ) 0.05 % Apply 1 Application topically 2 (two) times daily as needed (breakout). 01/17/24  Yes [provider]  empagliflozin  (JARDIANCE ) 10 MG TABS tablet Take 1 tablet by mouth daily.   Yes [provider]  EPINEPHrine  0.3 mg/0.3 mL IJ SOAJ injection Inject 0.3 mg into the muscle as needed for anaphylaxis. 04/17/24  Yes Luke Orlan HERO, DO  fenofibrate  160 MG tablet Take 160 mg by mouth daily.   Yes [provider]  Fluocinolone  Acetonide Body (DERMA-SMOOTHE /FS BODY) 0.01 % OIL Apply once a day on the whole body. Once rash/itching clears up only use up to twice per week. 04/17/24  Yes Luke Orlan HERO, DO  gabapentin  (NEURONTIN ) 100 MG capsule TK 1 C PO IN MORNING AND 3 CAPSULE HS 11/12/18  Yes [provider]  insulin  glargine (LANTUS ) 100 UNIT/ML injection Inject 25 Units into the skin at bedtime.   Yes [provider]  insulin  lispro (HUMALOG) 100 UNIT/ML injection INJECT UNDER SKIN 0 UNIT(0-150), 3 UNITS(151-200), 5 UNITS(201-300) AND 7 UNITS IF GREATER THAN 301.   Yes [provider]  metFORMIN  (GLUCOPHAGE -XR) 500 MG 24 hr tablet TK 2 TS PO QAM AND 1 T QPM 11/14/18  Yes [provider]  NONFORMULARY OR COMPOUNDED ITEM [facility] compound:  Peripheral Neuropathy cream - Bupivacaine 1%, Doxepin 3%, Gabapentin  6%, Pentoxifylline 3%, Topiramate 1%, dispense 120 grams, apply 1-2 grams to affected area 3-4 times daily, +2refills. 09/06/15  Yes Gershon Donnice SAUNDERS, DPM  olmesartan (BENICAR) 20 MG tablet Take 20 mg by mouth daily.   Yes [provider]  omeprazole (PRILOSEC) 20 MG capsule Take 20 mg by mouth daily.   Yes [provider]  polyethylene glycol (MIRALAX) 17 g packet Take 17 g by mouth 2 (two) times a week.   Yes [provider]  sertraline  (ZOLOFT ) 100 MG tablet Take 100 mg by mouth daily. 05/18/24  Yes [provider]  sitaGLIPtin (JANUVIA) 100 MG tablet Take 1 tablet by mouth daily.   Yes [provider]  triazolam (HALCION) 0.125 MG tablet TAKE 3 TABLETS BY MOUTH PRIOR TO DENTAL PROCEDURE AS DIRECTED BY DENTAL STAFF 03/07/18  Yes [provider]    Physical Exam: Vitals:   06/14/24 1923 06/14/24 2015 06/14/24 2128 06/15/24 1206  BP: (!) 146/99 136/78 132/87 (!) 152/81  Pulse: 80 74 76 80  Resp: 16 20 14 20   Temp: 98.4 F (36.9 C)  98.2 F (36.8 C) 99.5 F (37.5 C)  TempSrc: Oral  Oral Oral  SpO2: 95% 100% 99% 96%  Weight:      Height:       General exam: Appears calm and comfortable  Respiratory system: Clear to auscultation. Respiratory effort normal. Cardiovascular system: S1 & S2 heard, irregularly irregular. No JVD. Or pedal edema Gastrointestinal system: Abdomen is soft bs+ Central nervous system: Alert , but non verbal. Follows simple commands.  Extremities: persistent right sided weakness.  Skin: No rashes,  Psychiatry: calm , not agitated.   Data Reviewed: Results for orders placed or performed during the hospital encounter of 06/14/24 (from the past 24 hours)  CBG monitoring, ED     Status: Abnormal   Collection Time: 06/14/24  9:52 PM  Result Value Ref Range   Glucose-Capillary 273 (H) 70 - 99 mg/dL  Hemoglobin J8r     Status: Abnormal   Collection Time: 06/15/24  1:37 AM  Result Value Ref Range   Hgb A1c MFr Bld 8.7 (H) 4.8 - 5.6 %   Mean Plasma Glucose 202.99 mg/dL  CBG monitoring, ED     Status: Abnormal   Collection Time: 06/15/24  7:38 AM  Result Value Ref Range   Glucose-Capillary 199 (H) 70 - 99 mg/dL  CBG monitoring, ED     Status: Abnormal   Collection Time: 06/15/24  1:14 PM  Result Value Ref Range   Glucose-Capillary 226 (H)  70 - 99 mg/dL  CBG monitoring, ED     Status: Abnormal   Collection Time: 06/15/24  4:37 PM  Result Value Ref Range   Glucose-Capillary 216 (H) 70 - 99 mg/dL    Results for orders placed or performed during the hospital encounter of 06/14/24 (from the past 48 hours)  CBG monitoring, ED     Status: Abnormal   Collection Time: 06/14/24  1:57 PM  Result Value Ref Range   Glucose-Capillary 250 (H) 70 - 99 mg/dL    Comment: Glucose reference range applies only to samples taken after fasting for at least 8 hours.  Protime-INR     Status: None   Collection Time: 06/14/24  2:00 PM  Result Value Ref Range   Prothrombin Time 14.0 11.4 - 15.2 seconds   INR 1.0 0.8 - 1.2    Comment: (NOTE) INR goal varies based on device and disease states. Performed at [facility], [address], [place], [place] [address]   APTT     Status: Abnormal   Collection Time: 06/14/24  2:00 PM  Result Value Ref Range   aPTT 22 (L) 24 - 36 seconds    Comment: Performed at [facility], [address] [place], [place] [address]  CBC     Status: None   Collection Time: 06/14/24  2:00 PM  Result Value Ref Range   WBC 7.2 4.0 - 10.5 K/uL   RBC 5.32 4.22 - 5.81 MIL/uL   Hemoglobin 15.0 13.0 - 17.0 g/dL   HCT 53.0 60.9 - 47.9 %   MCV 88.2 80.0 - 100.0 fL   MCH 28.2 26.0 - 34.0 pg   MCHC 32.0 30.0 - 36.0 g/dL   RDW 86.7 88.4 - 84.4 %   Platelets 246 150 - 400 K/uL   nRBC 0.0 0.0 - 0.2 %    Comment: Performed at [facility], [address], [place], [place] [address]  Differential     Status: None   Collection Time: 06/14/24  2:00 PM  Result Value Ref Range   Neutrophils Relative % 61 %   Neutro Abs 4.4 1.7 - 7.7 K/uL   Lymphocytes Relative 26 %   Lymphs Abs 1.8 0.7 - 4.0 K/uL   Monocytes Relative 11 %   Monocytes Absolute 0.8 0.1 - 1.0 K/uL   Eosinophils Relative 2 %   Eosinophils Absolute 0.1 0.0 - 0.5 K/uL   Basophils Relative 0 %   Basophils Absolute 0.0 0.0 - 0.1 K/uL   Immature  Granulocytes 0 %   Abs Immature Granulocytes 0.03 0.00 - 0.07 K/uL    Comment: Performed at [facility], [address], [place], [place] [address]  Comprehensive metabolic panel     Status: Abnormal   Collection Time: 06/14/24  2:00 PM  Result Value Ref Range   Sodium 143 135 - 145 mmol/L   Potassium 5.3 (H) 3.5 - 5.1 mmol/L    Comment: HEMOLYSIS AT THIS LEVEL MAY AFFECT RESULT   Chloride 103 98 - 111 mmol/L   CO2 29 22 - 32 mmol/L   Glucose, Bld 260 (H) 70 - 99 mg/dL    Comment: Glucose reference range applies only to samples taken after fasting for at least 8 hours.   BUN 34 (H) 6 - 20 mg/dL   Creatinine, Ser 8.77 0.61 - 1.24 mg/dL   Calcium 9.9 8.9 - 89.6 mg/dL   Total Protein 7.7 6.5 - 8.1 g/dL   Albumin 4.5 3.5 - 5.0 g/dL   AST 36 15 - 41 U/L    Comment: HEMOLYSIS AT THIS LEVEL MAY AFFECT RESULT   ALT 45 (H) 0 - 44 U/L   Alkaline Phosphatase 113 38 - 126 U/L   Total Bilirubin 0.5 0.0 - 1.2 mg/dL   GFR, Estimated >39 >39 mL/min    Comment: (NOTE) Calculated using the CKD-EPI Creatinine Equation (2021)    Anion gap 11 5 - 15    Comment: Performed at [facility], [address] [place], [place] [address]  Ethanol     Status: None   Collection Time: 06/14/24  2:00 PM  Result Value Ref Range  Alcohol, Ethyl (B) <15 <15 mg/dL    Comment: (NOTE) For medical purposes only. Performed at [facility], [address] [place], [place] [address]   I-stat chem 8, ed     Status: Abnormal   Collection Time: 06/14/24  2:02 PM  Result Value Ref Range   Sodium 142 135 - 145 mmol/L   Potassium 5.1 3.5 - 5.1 mmol/L   Chloride 103 98 - 111 mmol/L   BUN 41 (H) 6 - 20 mg/dL   Creatinine, Ser 8.69 (H) 0.61 - 1.24 mg/dL   Glucose, Bld 748 (H) 70 - 99 mg/dL    Comment: Glucose reference range applies only to samples taken after fasting for at least 8 hours.   Calcium, Ion 1.20 1.15 - 1.40 mmol/L   TCO2 30 22 - 32 mmol/L   Hemoglobin 16.3 13.0 - 17.0 g/dL   HCT  51.9 60.9 - 47.9 %  CBG monitoring, ED     Status: Abnormal   Collection Time: 06/14/24  9:52 PM  Result Value Ref Range   Glucose-Capillary 273 (H) 70 - 99 mg/dL    Comment: Glucose reference range applies only to samples taken after fasting for at least 8 hours.  Hemoglobin A1c     Status: Abnormal   Collection Time: 06/15/24  1:37 AM  Result Value Ref Range   Hgb A1c MFr Bld 8.7 (H) 4.8 - 5.6 %    Comment: (NOTE) Diagnosis of Diabetes The following HbA1c ranges recommended by the American Diabetes Association (ADA) may be used as an aid in the diagnosis of diabetes mellitus.  Hemoglobin             Suggested A1C NGSP%              Diagnosis  <5.7                   Non Diabetic  5.7-6.4                Pre-Diabetic  >6.4                   Diabetic  <7.0                   Glycemic control for                       adults with diabetes.     Mean Plasma Glucose 202.99 mg/dL    Comment: Performed at [facility], [address] [place], [place] [address]  CBG monitoring, ED     Status: Abnormal   Collection Time: 06/15/24  7:38 AM  Result Value Ref Range   Glucose-Capillary 199 (H) 70 - 99 mg/dL    Comment: Glucose reference range applies only to samples taken after fasting for at least 8 hours.  CBG monitoring, ED     Status: Abnormal   Collection Time: 06/15/24  1:14 PM  Result Value Ref Range   Glucose-Capillary 226 (H) 70 - 99 mg/dL    Comment: Glucose reference range applies only to samples taken after fasting for at least 8 hours.  CBG monitoring, ED     Status: Abnormal   Collection Time: 06/15/24  4:37 PM  Result Value Ref Range   Glucose-Capillary 216 (H) 70 - 99 mg/dL    Comment: Glucose reference range applies only to samples taken after fasting for at least 8 hours.    Assessment and Plan:  Right sided weakness started on 1/14  Differential include TIA vs CVA.  Initial MRI brain is an incomplete study due to motion degraded film.  Repeat MRI  brain ordered.  Stroke work up in progress.  Neurology on board.  Therapy eval recommending SNF.  Lipid panel ordered.  Hemoglobin A1c is 8.7.     Uncontrolled DM A1c is 8.7. Continue with SSI.    New onset atrial fibrillation with RVR Rate control with cardizem  and start the patient on anti coagulation/ eliquis  once MRI is done .  Cardiology on board  Echocardiogram ordered.    Fall:  CT cervical spine is negative for acute fractures.   H/o autism:  Non verbal and baseline as per family.    Advance Care Planning:   Code Status: Full Code   Consults: neurology, cardiology.   Family Communication: family at bedside.   Severity of Illness: The appropriate patient status for this patient is OBSERVATION. Observation status is judged to be reasonable and necessary in order to provide the required intensity of service to ensure the patient's safety. The patient's presenting symptoms, physical exam findings, and initial radiographic and laboratory data in the context of their medical condition is felt to place them at decreased risk for further clinical deterioration. Furthermore, it is anticipated that the patient will be medically stable for discharge from the hospital within 2 midnights of admission.   Author: Elgie Butter, MD 06/15/2024 5:43 PM  For on call review [email or web address].     [1]  Allergies Allergen Reactions   Ace Inhibitors     Other reaction(s): cough Other reaction(s): cough   Iodine     Other reaction(s): Unknown Other reaction(s): Unknown   Shellfish Allergy      Other reaction(s): Unknown   Shellfish Protein-Containing Drug Products     Other reaction(s): Unknown Other reaction(s): Unknown   Simvastatin     Other reaction(s): intolerance to Other reaction(s): intolerance to   "

## 2024-06-15 NOTE — Progress Notes (Addendum)
 CSW spoke w/ pt brother, Jasten Guyette ([phone]), to discuss SNF recommendation. Brother agrees that SNF placement would be appropriate at this time due to familys inability to safely care for pt at home given pts limited mobility. CSW faxed SNF referrals; bed offers pending.   UPDATE: Bed offer presented to pt brother, bed choice [facility]. Ins auth started; auth approve.

## 2024-06-15 NOTE — ED Provider Notes (Addendum)
 Emergency Medicine Observation Re-evaluation Note  Eric Cummings is a 59 y.o. male, seen on rounds today.  Pt initially presented to the ED for complaints of Code Stroke Currently, the patient is resting comfortably. Family is at the bedside.  He had come in with chief complaint of code stroke with right leg weakness.  However, nursing staff noted that patient's heart rate has been going up and down.  EKG reveals new onset A-fib with RVR.  Physical Exam  BP (!) 152/81   Pulse 80   Temp 99.5 F (37.5 C) (Oral)   Resp 20   Ht 5' 10 (1.778 m)   Wt 74 kg   SpO2 96%   BMI 23.41 kg/m  Physical Exam General: No acute distress Cardiac: Irregularly irregular, tachycardia Lungs: Clear Psych: Weakness in the right upper and lower extremity Exam significantly limited because of patient's advanced autism  ED Course / MDM  EKG:EKG Interpretation Date/Time:  Thursday June 15 2024 13:42:23 EST Ventricular Rate:  153 PR Interval:  210 QRS Duration:  77 QT Interval:  282 QTC Calculation: 450 R Axis:   58  Text Interpretation: Atrial fibrillation Paired ventricular premature complexes Repolarization abnormality, prob rate related A-fib is new Confirmed by Charlyn Sora [phone]) on 06/15/2024 2:55:35 PM  I have reviewed the labs performed to date as well as medications administered while in observation.  Recent changes in the last 24 hours include -new onset A-fib, MRI which was negative for acute stroke.  Seizure workup which was negative.  Plan  Current plan is for starting patient on diltiazem  drip.  I consulted neurology.  They recommend that patient get repeat MRI brain and C-spine and they will see the patient tomorrow.  Hold heparin until MRI is completed.  For now we will focus on rate control and proceed with admission request.   CRITICAL CARE Performed by: Sweet Jarvis   Total critical care time: 32 minutes  Critical care time was exclusive of separately  billable procedures and treating other patients.  Critical care was necessary to treat or prevent imminent or life-threatening deterioration.  Critical care was time spent personally by me on the following activities: development of treatment plan with patient and/or surrogate as well as nursing, discussions with consultants, evaluation of patient's response to treatment, examination of patient, obtaining history from patient or surrogate, ordering and performing treatments and interventions, ordering and review of laboratory studies, ordering and review of radiographic studies, pulse oximetry and re-evaluation of patient's condition.     Charlyn Sora, MD 06/15/24 1456    Charlyn Sora, MD 06/15/24 1457

## 2024-06-16 ENCOUNTER — Encounter (HOSPITAL_COMMUNITY): Payer: Self-pay | Admitting: Internal Medicine

## 2024-06-16 ENCOUNTER — Inpatient Hospital Stay (HOSPITAL_COMMUNITY)

## 2024-06-16 ENCOUNTER — Other Ambulatory Visit (HOSPITAL_COMMUNITY): Payer: Self-pay

## 2024-06-16 ENCOUNTER — Telehealth (HOSPITAL_COMMUNITY): Payer: Self-pay

## 2024-06-16 DIAGNOSIS — E1151 Type 2 diabetes mellitus with diabetic peripheral angiopathy without gangrene: Secondary | ICD-10-CM

## 2024-06-16 DIAGNOSIS — I63422 Cerebral infarction due to embolism of left anterior cerebral artery: Secondary | ICD-10-CM

## 2024-06-16 DIAGNOSIS — I639 Cerebral infarction, unspecified: Secondary | ICD-10-CM

## 2024-06-16 DIAGNOSIS — Z7984 Long term (current) use of oral hypoglycemic drugs: Secondary | ICD-10-CM | POA: Diagnosis not present

## 2024-06-16 DIAGNOSIS — I10 Essential (primary) hypertension: Secondary | ICD-10-CM | POA: Diagnosis not present

## 2024-06-16 DIAGNOSIS — I69391 Dysphagia following cerebral infarction: Secondary | ICD-10-CM | POA: Diagnosis not present

## 2024-06-16 DIAGNOSIS — E785 Hyperlipidemia, unspecified: Secondary | ICD-10-CM | POA: Diagnosis not present

## 2024-06-16 DIAGNOSIS — I4891 Unspecified atrial fibrillation: Secondary | ICD-10-CM | POA: Diagnosis not present

## 2024-06-16 DIAGNOSIS — R29709 NIHSS score 9: Secondary | ICD-10-CM

## 2024-06-16 DIAGNOSIS — Z794 Long term (current) use of insulin: Secondary | ICD-10-CM

## 2024-06-16 LAB — BASIC METABOLIC PANEL WITH GFR
Anion gap: 13 (ref 5–15)
BUN: 31 mg/dL — ABNORMAL HIGH (ref 6–20)
CO2: 22 mmol/L (ref 22–32)
Calcium: 9.2 mg/dL (ref 8.9–10.3)
Chloride: 97 mmol/L — ABNORMAL LOW (ref 98–111)
Creatinine, Ser: 1.22 mg/dL (ref 0.61–1.24)
GFR, Estimated: >60 mL/min
Glucose, Bld: 212 mg/dL — ABNORMAL HIGH (ref 70–99)
Potassium: 4.7 mmol/L (ref 3.5–5.1)
Sodium: 133 mmol/L — ABNORMAL LOW (ref 135–145)

## 2024-06-16 LAB — ECHOCARDIOGRAM COMPLETE
Height: 70 in
S' Lateral: 3.6 cm
Single Plane A2C EF: 58.8 %
Weight: 2610.25 [oz_av]

## 2024-06-16 LAB — CBC WITH DIFFERENTIAL/PLATELET
Abs Immature Granulocytes: 0.06 K/uL (ref 0.00–0.07)
Basophils Absolute: 0 K/uL (ref 0.0–0.1)
Basophils Relative: 0 %
Eosinophils Absolute: 0 K/uL (ref 0.0–0.5)
Eosinophils Relative: 0 %
HCT: 43.6 % (ref 39.0–52.0)
Hemoglobin: 14.2 g/dL (ref 13.0–17.0)
Immature Granulocytes: 1 %
Lymphocytes Relative: 13 %
Lymphs Abs: 1.3 K/uL (ref 0.7–4.0)
MCH: 28.1 pg (ref 26.0–34.0)
MCHC: 32.6 g/dL (ref 30.0–36.0)
MCV: 86.3 fL (ref 80.0–100.0)
Monocytes Absolute: 0.7 K/uL (ref 0.1–1.0)
Monocytes Relative: 7 %
Neutro Abs: 8.1 K/uL — ABNORMAL HIGH (ref 1.7–7.7)
Neutrophils Relative %: 79 %
Platelets: 254 K/uL (ref 150–400)
RBC: 5.05 MIL/uL (ref 4.22–5.81)
RDW: 13.1 % (ref 11.5–15.5)
WBC: 10.1 K/uL (ref 4.0–10.5)
nRBC: 0 % (ref 0.0–0.2)

## 2024-06-16 LAB — HIV ANTIBODY (ROUTINE TESTING W REFLEX): HIV Screen 4th Generation wRfx: NONREACTIVE

## 2024-06-16 LAB — LIPID PANEL
Cholesterol: 111 mg/dL (ref 0–200)
HDL: 42 mg/dL
LDL Cholesterol: 49 mg/dL (ref 0–99)
Total CHOL/HDL Ratio: 2.7 ratio
Triglycerides: 101 mg/dL
VLDL: 20 mg/dL (ref 0–40)

## 2024-06-16 LAB — GLUCOSE, CAPILLARY
Glucose-Capillary: 175 mg/dL — ABNORMAL HIGH (ref 70–99)
Glucose-Capillary: 224 mg/dL — ABNORMAL HIGH (ref 70–99)
Glucose-Capillary: 269 mg/dL — ABNORMAL HIGH (ref 70–99)
Glucose-Capillary: 209 mg/dL — ABNORMAL HIGH (ref 70–99)

## 2024-06-16 LAB — CBG MONITORING, ED: Glucose-Capillary: 177 mg/dL — ABNORMAL HIGH (ref 70–99)

## 2024-06-16 MED ORDER — INSULIN GLARGINE 100 UNITS/ML SOLOSTAR PEN: 10.0000 [IU] | PEN_INJECTOR | SUBCUTANEOUS | Status: DC

## 2024-06-16 MED ORDER — INSULIN GLARGINE-YFGN 100 UNIT/ML ~~LOC~~ SOLN
10.0000 [IU] | Freq: Every day | SUBCUTANEOUS | Status: DC
  Administered 2024-06-17 – 2024-06-19 (×3): 10 [IU] via SUBCUTANEOUS
  Filled 2024-06-16 (×4): qty 0.1

## 2024-06-16 MED ORDER — GADOBUTROL 1 MMOL/ML IV SOLN
7.0000 mL | Freq: Once | INTRAVENOUS | Status: AC | PRN
  Administered 2024-06-16: 7 mL via INTRAVENOUS

## 2024-06-16 MED ORDER — APIXABAN 5 MG PO TABS
5.0000 mg | ORAL_TABLET | Freq: Two times a day (BID) | ORAL | Status: DC
  Administered 2024-06-16 – 2024-06-19 (×8): 5 mg via ORAL
  Filled 2024-06-16 (×8): qty 1

## 2024-06-16 MED ORDER — LORAZEPAM 2 MG/ML IJ SOLN
0.5000 mg | Freq: Once | INTRAMUSCULAR | Status: AC
  Administered 2024-06-16: 0.5 mg via INTRAVENOUS
  Filled 2024-06-16: qty 1

## 2024-06-16 NOTE — Telephone Encounter (Signed)

## 2024-06-16 NOTE — Progress Notes (Signed)
" °  Echocardiogram 2D Echocardiogram has been performed.  Koleen KANDICE Popper, RDCS 06/16/2024, 4:42 PM "

## 2024-06-16 NOTE — Discharge Instructions (Signed)

## 2024-06-16 NOTE — Progress Notes (Addendum)
 PHARMACY - ANTICOAGULATION CONSULT NOTE  Pharmacy Consult for apixaban  Indication: atrial fibrillation  Allergies[1]  Patient Measurements: Height: 5' 10 (177.8 cm) Weight: 74 kg (163 lb 2.3 oz) IBW/kg (Calculated) : 73 HEPARIN DW (KG): 74  Vital Signs: BP: 138/73 (01/15 2330) Pulse Rate: 66 (01/15 2330)  Labs: Recent Labs    06/14/24 1400 06/14/24 1402  HGB 15.0 16.3  HCT 46.9 48.0  PLT 246  --   APTT 22*  --   LABPROT 14.0  --   INR 1.0  --   CREATININE 1.22 1.30*    Estimated Creatinine Clearance: 64 mL/min (A) (by C-G formula based on SCr of 1.3 mg/dL (H)).   Medical History: Past Medical History:  Diagnosis Date   Diabetes mellitus without complication (HCC)    Eczema    Urticaria     Assessment: 48 yoM presented with acute onset of RS weakness and concerns for stroke. 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 -CHADs-Vasc: 3   Goal of Therapy:  Monitor platelets by anticoagulation protocol: Yes   Plan:  -Start apixaban  5mg  PO BID -CBC daily  Lynwood Poplar, PharmD, BCPS Clinical Pharmacist 06/16/2024 12:30 AM        [1]  Allergies Allergen Reactions   Ace Inhibitors     Other reaction(s): cough Other reaction(s): cough   Iodine     Other reaction(s): Unknown Other reaction(s): Unknown   Shellfish Allergy      Other reaction(s): Unknown   Shellfish Protein-Containing Drug Products     Other reaction(s): Unknown Other reaction(s): Unknown   Simvastatin     Other reaction(s): intolerance to Other reaction(s): intolerance to

## 2024-06-16 NOTE — Progress Notes (Signed)
 "  Progress Note  Patient Name: Eric Cummings Date of Encounter: 06/16/2024  Primary Cardiologist: None   Subjective   Sister at bedside. No overnight events. Patient unable to provide history due to baseline nonverbal status.   Inpatient Medications    Scheduled Meds:   stroke: early stages of recovery book   Does not apply Once   amLODipine   10 mg Oral Daily   apixaban   5 mg Oral BID   diltiazem   30 mg Oral Q6H   empagliflozin   10 mg Oral Daily   fenofibrate   160 mg Oral Daily   gabapentin   100 mg Oral QPC breakfast   gabapentin   300 mg Oral QHS   insulin  aspart  0-15 Units Subcutaneous TID WC   insulin  aspart  0-5 Units Subcutaneous QHS   irbesartan   150 mg Oral Daily   linagliptin   5 mg Oral Daily   metFORMIN   500 mg Oral BID WC   pantoprazole   40 mg Oral Daily   sertraline   100 mg Oral Daily   Continuous Infusions:  sodium chloride  40 mL/hr at 06/15/24 2201   diltiazem  (CARDIZEM ) infusion Stopped (06/16/24 0637)   PRN Meds: acetaminophen  **OR** acetaminophen  (TYLENOL ) oral liquid 160 mg/5 mL **OR** acetaminophen , clobetasol  cream, senna-docusate   Vital Signs    Vitals:   06/16/24 0430 06/16/24 0500 06/16/24 0520 06/16/24 0550  BP:  115/66    Pulse: (!) 57 62 61 60  Resp: 16 15 15 15   Temp:      TempSrc:      SpO2: 98% 97% 99% 98%  Weight:      Height:       No intake or output data in the 24 hours ending 06/16/24 0733 Filed Weights   06/14/24 1400 06/14/24 1431  Weight: 73.4 kg 74 kg    Telemetry    Atrial fibrillation converted to normal sinus rhythm with frequent PACs and PVCs at roughly 8:30 PM- Personally Reviewed  ECG    Normal sinus rhythm- Personally Reviewed  Physical Exam   Physical Exam Vitals reviewed.  HENT:     Head: Normocephalic and atraumatic.  Eyes:     Conjunctiva/sclera: Conjunctivae normal.  Cardiovascular:     Rate and Rhythm: Normal rate and regular rhythm.  Pulmonary:     Effort: Pulmonary effort is normal.   Musculoskeletal:        General: No swelling.  Skin:    Coloration: Skin is not jaundiced.  Neurological:     Mental Status: He is alert.      Labs    Chemistry Recent Labs  Lab 06/14/24 1400 06/14/24 1402 06/16/24 0031  NA 143 142 133*  K 5.3* 5.1 4.7  CL 103 103 97*  CO2 29  --  22  GLUCOSE 260* 251* 212*  BUN 34* 41* 31*  CREATININE 1.22 1.30* 1.22  CALCIUM 9.9  --  9.2  PROT 7.7  --   --   ALBUMIN 4.5  --   --   AST 36  --   --   ALT 45*  --   --   ALKPHOS 113  --   --   BILITOT 0.5  --   --   GFRNONAA >60  --  >60  ANIONGAP 11  --  13     Hematology Recent Labs  Lab 06/14/24 1400 06/14/24 1402 06/16/24 0322  WBC 7.2  --  10.1  RBC 5.32  --  5.05  HGB 15.0 16.3 14.2  HCT 46.9 48.0 43.6  MCV 88.2  --  86.3  MCH 28.2  --  28.1  MCHC 32.0  --  32.6  RDW 13.2  --  13.1  PLT 246  --  254    Cardiac EnzymesNo results for input(s): TROPONINI in the last 168 hours. No results for input(s): TROPIPOC in the last 168 hours.   BNPNo results for input(s): BNP, PROBNP in the last 168 hours.   DDimer No results for input(s): DDIMER in the last 168 hours.   Radiology    MR ANGIO NECK W WO CONTRAST Result Date: 06/16/2024 EXAM: MRA Neck without and with contrast 06/16/2024 04:27:52 AM TECHNIQUE: Multiplanar multisequence MRA of the neck was performed without and with the administration of 7 mL of gadobutrol  (GADAVIST ) 1 MMOL/ML injection. 2D and 3D reformatted images are provided for review. Stenosis of the internal carotid arteries is measured using NASCET criteria. COMPARISON: None available CLINICAL HISTORY: stroke FINDINGS: CAROTID ARTERIES: The cervical segments of the internal carotid arteries are moderately tortuous bilaterally, but normal in caliber. No dissection. No hemodynamically significant stenosis by NASCET criteria. VERTEBRAL ARTERIES: The left vertebral artery is dominant and the right vertebral artery is relatively hypoplastic. No  dissection. No significant stenosis. IMPRESSION: 1. No hemodynamically significant stenosis or dissection of the neck arteries. 2. Moderate tortuosity of the cervical segments of the internal carotid arteries bilaterally, otherwise normal in caliber. 3. Dominant left vertebral artery with relatively hypoplastic right vertebral artery. Electronically signed by: Evalene Coho MD 06/16/2024 04:50 AM EST RP Workstation: [id number]   MR ANGIO HEAD WO CONTRAST Result Date: 06/16/2024 EXAM: MR Angiography Head without intravenous Contrast. 06/16/2024 04:27:37 AM TECHNIQUE: Magnetic resonance angiography images of the head without intravenous contrast. Multiplanar 2D and 3D reformatted images are provided for review. COMPARISON: None provided. CLINICAL HISTORY: Stroke, follow up. FINDINGS: ANTERIOR CIRCULATION: Mild stenosis of the ascending petrous segment of the right internal carotid artery. No significant stenosis of the left internal carotid artery. The right A1 segment is hypoplastic. No significant stenosis of the left anterior cerebral artery. No significant stenosis of the middle cerebral arteries. No aneurysm. POSTERIOR CIRCULATION: The left vertebral artery is dominant. No significant stenosis of the right vertebral artery. No significant stenosis of the posterior cerebral arteries. No significant stenosis of the basilar artery. No aneurysm. IMPRESSION: 1. Mild stenosis of the ascending petrous segment of the right internal carotid artery. 2. Hypoplastic right A1 segment. 3. Dominant left vertebral artery. Electronically signed by: Evalene Coho MD 06/16/2024 04:47 AM EST RP Workstation: [id number]   MR BRAIN WO CONTRAST Result Date: 06/15/2024 CLINICAL DATA:  Initial evaluation for acute neuro deficit, stroke suspected. EXAM: MRI HEAD WITHOUT CONTRAST TECHNIQUE: Multiplanar, multiecho pulse sequences of the brain and surrounding structures were obtained without intravenous contrast. COMPARISON:   Comparison made with prior MRI from 06/14/2024. FINDINGS: Brain: Cerebral volume within normal limits. Patchy T2/FLAIR hyperintensity involving the periventricular deep white matter, consistent with chronic small vessel ischemic disease, mild for age. Approximate 4.4 cm area of restricted diffusion seen involving the parafalcine left frontoparietal region, consistent with an acute left ACA distribution infarct (series 5, image 68). No associated hemorrhage or mass effect. No other evidence for acute or subacute ischemia. Gray-white matter differentiation otherwise maintained. No other areas of chronic cortical infarction. No acute or chronic intracranial blood products. No mass lesion, midline shift or mass effect no hydrocephalus or extra-axial fluid collection. Pituitary gland and suprasellar region within normal limits. Vascular: Major intracranial vascular flow voids  are maintained. Hypoplastic right vertebral artery noted. Skull and upper cervical spine: Craniocervical junction within normal limits. Bone marrow signal intensity overall within normal limits for no scalp soft tissue abnormality. Sinuses/Orbits: Globes and orbital soft tissues within normal limits. Scattered retention cysts noted about the maxillary sinuses. Paranasal sinuses are otherwise largely clear. No significant mastoid effusion. Other: None. IMPRESSION: 1. 4.4 cm acute left ACA distribution infarct. No associated hemorrhage or mass effect. 2. Underlying mild chronic microvascular ischemic disease. Electronically Signed   By: Morene Hoard M.D.   On: 06/15/2024 23:25   CT CERVICAL SPINE WO CONTRAST Result Date: 06/15/2024 CLINICAL DATA:  [facility] EXAM: CT CERVICAL SPINE WITHOUT CONTRAST TECHNIQUE: Multidetector CT imaging of the cervical spine was performed without intravenous contrast. Multiplanar CT image reconstructions were also generated. RADIATION DOSE REDUCTION: This exam was performed according to the departmental  dose-optimization program which includes automated exposure control, adjustment of the mA and/or kV according to patient size and/or use of iterative reconstruction technique. COMPARISON:  None Available. FINDINGS: Alignment: Alignment is anatomic. Skull base and vertebrae: No acute fracture. No primary bone lesion or focal pathologic process. Soft tissues and spinal canal: No prevertebral fluid or swelling. No visible canal hematoma. Disc levels: Mild multilevel spondylosis most pronounced at C3-4. Mild right predominant facet hypertrophy from C3-4 through C6-7. No significant bony encroachment upon the central canal or neural foramina. Upper chest: Airway is patent.  Lung apices are clear. Other: Reconstructed images demonstrate no additional findings. IMPRESSION: 1. No acute cervical spine fracture. 2. Mild multilevel cervical degenerative changes. Electronically Signed   By: Ozell Daring M.D.   On: 06/15/2024 16:18   VAS US  LOWER EXTREMITY VENOUS (DVT) (7a-5p) Result Date: 06/15/2024  Lower Venous DVT Study Patient Name:  VIHAAN GLOSS  Date of Exam:   06/15/2024 Medical Rec #: [id number]            Accession #:    [id number] Date of Birth: 07/09/1965             Patient Gender: M Patient Age:   29 years Exam Location:  [facility] Procedure:      VAS US  LOWER EXTREMITY VENOUS (DVT) Referring Phys: ADAM CURATOLO --------------------------------------------------------------------------------  Indications: Pain.  Limitations: Suboptimal positioning. Comparison Study: No previous exams Performing Technologist: Jody Hill RVT, RDMS  Examination Guidelines: A complete evaluation includes B-mode imaging, spectral Doppler, color Doppler, and power Doppler as needed of all accessible portions of each vessel. Bilateral testing is considered an integral part of a complete examination. Limited examinations for reoccurring indications may be performed as noted. The reflux portion of the exam is performed with  the patient in reverse Trendelenburg.  +---------+---------------+---------+-----------+----------+--------------+ RIGHT    CompressibilityPhasicitySpontaneityPropertiesThrombus Aging +---------+---------------+---------+-----------+----------+--------------+ CFV      Full           Yes      Yes                                 +---------+---------------+---------+-----------+----------+--------------+ SFJ      Full                                                        +---------+---------------+---------+-----------+----------+--------------+ FV Prox  Full  Yes      Yes                                 +---------+---------------+---------+-----------+----------+--------------+ FV Mid   Full           Yes      Yes                                 +---------+---------------+---------+-----------+----------+--------------+ FV DistalFull           Yes      Yes                                 +---------+---------------+---------+-----------+----------+--------------+ PFV      Full                                                        +---------+---------------+---------+-----------+----------+--------------+ POP      Full           Yes      Yes                                 +---------+---------------+---------+-----------+----------+--------------+ PTV      Full                                                        +---------+---------------+---------+-----------+----------+--------------+ PERO     Full                                                        +---------+---------------+---------+-----------+----------+--------------+   +----+---------------+---------+-----------+----------+--------------+ LEFTCompressibilityPhasicitySpontaneityPropertiesThrombus Aging +----+---------------+---------+-----------+----------+--------------+ CFV Full           Yes      Yes                                  +----+---------------+---------+-----------+----------+--------------+     Summary: RIGHT: - There is no evidence of deep vein thrombosis in the lower extremity.  - No cystic structure found in the popliteal fossa.  LEFT: - No evidence of common femoral vein obstruction.   *See table(s) above for measurements and observations. Electronically signed by Debby Robertson on 06/15/2024 at 2:12:55 PM.    Final    DG Lumbar Spine Complete Result Date: 06/14/2024 EXAM: 4 VIEW(S) XRAY OF THE LUMBAR SPINE 06/14/2024 06:25:00 PM COMPARISON: None available. CLINICAL HISTORY: PAIN PAIN PAIN FINDINGS: LUMBAR SPINE: BONES: Vertebral body heights are maintained. Alignment is normal. DISCS AND DEGENERATIVE CHANGES: Moderate facet arthrosis L3 - S1. SOFT TISSUES: No acute abnormality. IMPRESSION: 1. 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 Electronically signed by: Dorethia Molt MD 06/14/2024 06:29 PM EST RP Workstation: [id number]   DG Femur Portable Min 2 Views Right  Result Date: 06/14/2024 EXAM: 2 VIEW(S) XRAY OF THE RIGHT FEMUR 06/14/2024 04:52:00 PM COMPARISON: None available. CLINICAL HISTORY: right hip? right hip? right hip? FINDINGS: BONES AND JOINTS: No acute fracture. No malalignment. SOFT TISSUES: Mild vascular calcifications. IMPRESSION: 1. No acute findings. Electronically signed by: Morgane Naveau MD 06/14/2024 05:03 PM EST RP Workstation: [id number]   DG Tibia/Fibula Right Result Date: 06/14/2024 CLINICAL DATA:  Possible right lower leg abnormality. No known injury. EXAM: RIGHT TIBIA AND FIBULA - 2 VIEW COMPARISON:  None Available. FINDINGS: There is no evidence of fracture or other focal bone lesions. Soft tissues are unremarkable. IMPRESSION: Negative. Electronically Signed   By: Toribio Agreste M.D.   On: 06/14/2024 17:01   DG Pelvis Portable Result Date: 06/14/2024 CLINICAL DATA:  Right hip pain. EXAM: PORTABLE PELVIS 1-2 VIEWS COMPARISON:  None Available. FINDINGS: No acute fracture or dislocation on this  single image. There is heterogeneous density over the right femoral head. The bones are well mineralized. No significant arthritic changes. The soft tissues are unremarkable. IMPRESSION: No acute fracture or dislocation. Electronically Signed   By: Vanetta Chou M.D.   On: 06/14/2024 16:58   EEG adult Result Date: 06/14/2024 Shelton Arlin KIDD, MD     06/14/2024  4:39 PM Patient Name: LENN VOLKER MRN: [id number] Epilepsy Attending: Arlin KIDD Shelton Referring Physician/Provider: Waddell Karna LABOR, NP Date: 06/15/2023 Duration: 22.56 mins Patient history: 59 y.o. 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 EEG to evaluate for seizure Level of alertness: Awake AEDs during EEG study: Ativan  Technical aspects: This EEG study was done with scalp electrodes positioned according to the 10-20 International system of electrode placement. Electrical activity was reviewed with band pass filter of 1-70Hz , sensitivity of 7 uV/mm, display speed of 31mm/sec with a 60Hz  notched filter applied as appropriate. EEG data were recorded continuously and digitally stored.  Video monitoring was available and reviewed as appropriate. Description: The posterior dominant rhythm consists of 10 Hz activity of moderate voltage (25-35 uV) seen predominantly in posterior head regions, symmetric and reactive to eye opening and eye closing. There is an excessive amount of 15 to 18 Hz beta activity distributed symmetrically and diffusely. Hyperventilation and photic stimulation were not performed.   ABNORMALITY - Excessive beta, generalized IMPRESSION: This study is within normal limits. The excessive beta activity seen in the background is most likely due to the effect of benzodiazepine and is a benign EEG pattern. No seizures or epileptiform discharges were seen throughout the recording. A  normal interictal EEG does not exclude the diagnosis of epilepsy. Arlin KIDD Shelton   MR BRAIN WO CONTRAST Result Date: 06/14/2024 HISTORY: stroke EXAM: MR BRAIN WITHOUT CONTRAST TECHNIQUE: Multiplanar, multiecho pulse sequences of the brain and surrounding structures were obtained without intravenous contrast. Axial and coronal DWI, axial FLAIR, and axial GRE sequences were obtained. COMPARISON:  Same date CT head FINDINGS: The study is incomplete as the patient could not tolerate completing the exam. Axial and coronal DWI, motion degraded axial FLAIR, and axial GRE sequences were obtained. There is no evidence of acute infarct on the axial DWI sequence. There is no definite evidence of acute intracranial hemorrhage. Parenchymal volume is within normal limits. The ventricles are normal in size. Mild FLAIR signal abnormality in the periventricular white matter is nonspecific but may reflect mild underlying chronic small vessel ischemic change.  There is no definite mass lesion. There is no mass effect or midline shift. IMPRESSION: 1. Incomplete study with axial and coronal DWI, motion degraded axial FLAIR, and axial GRE sequences obtained. 2.  No evidence of acute intracranial pathology on the provided images. Electronically signed by: Maude Harry MD 06/14/2024 03:27 PM EST RP Workstation: [id number]   CT HEAD CODE STROKE WO CONTRAST Result Date: 06/14/2024 EXAM: CT HEAD WITHOUT CONTRAST 06/14/2024 02:06:59 PM TECHNIQUE: CT of the head was performed without the administration of intravenous contrast. Automated exposure control, iterative reconstruction, and/or weight based adjustment of the mA/kV was utilized to reduce the radiation dose to as low as reasonably achievable. COMPARISON: None available. CLINICAL HISTORY: The patient presents with an acute neurological deficit, and a stroke is suspected. FINDINGS: BRAIN AND VENTRICLES: No acute hemorrhage. No evidence of acute infarct. No hydrocephalus. No  extra-axial collection. No mass effect or midline shift. [place] Stroke Program Early CT (ASPECT) score: Ganglionic (caudate, IC, lentiform nucleus, insula, M1-M3): 7 Supraganglionic (M4-M6): 3 Total: 10 ORBITS: No acute abnormality. SINUSES: Polyps versus mucous retention cysts in the maxillary sinuses. SOFT TISSUES AND SKULL: No acute soft tissue abnormality. No skull fracture. IMPRESSION: 1. No acute intracranial abnormality. 2. ASPECT score is 10. 3. Findings messaged to Dr. Rosemarie via the [facility] messaging system at 2:24 PM on 06/14/24. Electronically signed by: Donnice Mania MD 06/14/2024 02:24 PM EST RP Workstation: [id number]    Cardiac Studies     Patient Profile     This is a 59 year old male with past medical history of autism spectrum disorder and is nonverbal at baseline, diabetes who initially presented as a code stroke due to acute right-sided leaning and weakness. He initially had a brain MRI which was negative however there was motion artifact and a repeat brain MRI was ordered and found to have acute left ACA stroke. On 06/15/2024 patient converted to atrial fibrillation with RVR which is a new diagnosis for him and prompted cardiology consult.   Assessment & Plan   New onset A-fib with RVR, currently in normal sinus rhythm-CHA2DS2-VASc: 3 (DM, Stroke) off diltiazem  drip and on diltiazem  30 mg every 4 hours however did not receive a dose this a.m. due to n.p.o. status.  If this is tolerated, would convert to diltiazem  120 mg daily.  Started Eliquis  5 mg twice daily.  Will check an echocardiogram with Definity contrast to evaluate for LV thrombus. Acute left ACA CVA with right-sided weakness- initial MRI with motion artifact. Repeat MRI showing 4.4 cm acute let ACA distribution infarct and mild chronic microvascular ischemic disease Diabetes Autism spectrum, nonverbal at baseline       For questions or updates, please contact [facility] Please consult [email or web address] for  contact info under        Signed, Emeline Calender, DO 06/16/2024, 7:33 AM    "

## 2024-06-16 NOTE — Progress Notes (Addendum)
 STROKE TEAM PROGRESS NOTE    SIGNIFICANT HOSPITAL EVENTS 1/14: Patient presented with right leg weakness, MRI negative for stroke 1/15: Patient found to be in A-fib with RVR, repeat MRI positive for left ACA distribution stroke  INTERIM HISTORY/SUBJECTIVE Patient presented 2 days ago with right-sided weakness where initial MRI was negative.  He went into A-fib and likely embolized again as repeat MRI yesterday showed left ACA branch infarct. Patient is converted back into normal sinus rhythm and remains hemodynamically stable.  He has been started on Eliquis .  OBJECTIVE  CBC    Component Value Date/Time   WBC 10.1 06/16/2024 0322   RBC 5.05 06/16/2024 0322   HGB 14.2 06/16/2024 0322   HGB 15.9 03/20/2024 1526   HCT 43.6 06/16/2024 0322   HCT 49.9 03/20/2024 1526   PLT 254 06/16/2024 0322   PLT 309 03/20/2024 1526   MCV 86.3 06/16/2024 0322   MCV 89 03/20/2024 1526   MCH 28.1 06/16/2024 0322   MCHC 32.6 06/16/2024 0322   RDW 13.1 06/16/2024 0322   RDW 12.5 03/20/2024 1526   LYMPHSABS 1.3 06/16/2024 0322   LYMPHSABS 1.4 03/20/2024 1526   MONOABS 0.7 06/16/2024 0322   EOSABS 0.0 06/16/2024 0322   EOSABS 0.1 03/20/2024 1526   BASOSABS 0.0 06/16/2024 0322   BASOSABS 0.0 03/20/2024 1526    BMET    Component Value Date/Time   NA 133 (L) 06/16/2024 0031   NA 136 03/20/2024 1526   K 4.7 06/16/2024 0031   CL 97 (L) 06/16/2024 0031   CO2 22 06/16/2024 0031   GLUCOSE 212 (H) 06/16/2024 0031   BUN 31 (H) 06/16/2024 0031   BUN 36 (H) 03/20/2024 1526   CREATININE 1.22 06/16/2024 0031   CALCIUM 9.2 06/16/2024 0031   EGFR 64 03/20/2024 1526   GFRNONAA >60 06/16/2024 0031    IMAGING past 24 hours MR ANGIO NECK W WO CONTRAST Result Date: 06/16/2024 EXAM: MRA Neck without and with contrast 06/16/2024 04:27:52 AM TECHNIQUE: Multiplanar multisequence MRA of the neck was performed without and with the administration of 7 mL of gadobutrol  (GADAVIST ) 1 MMOL/ML injection. 2D and 3D  reformatted images are provided for review. Stenosis of the internal carotid arteries is measured using NASCET criteria. COMPARISON: None available CLINICAL HISTORY: stroke FINDINGS: CAROTID ARTERIES: The cervical segments of the internal carotid arteries are moderately tortuous bilaterally, but normal in caliber. No dissection. No hemodynamically significant stenosis by NASCET criteria. VERTEBRAL ARTERIES: The left vertebral artery is dominant and the right vertebral artery is relatively hypoplastic. No dissection. No significant stenosis. IMPRESSION: 1. No hemodynamically significant stenosis or dissection of the neck arteries. 2. Moderate tortuosity of the cervical segments of the internal carotid arteries bilaterally, otherwise normal in caliber. 3. Dominant left vertebral artery with relatively hypoplastic right vertebral artery. Electronically signed by: Evalene Coho MD 06/16/2024 04:50 AM EST RP Workstation: [id number]   MR ANGIO HEAD WO CONTRAST Result Date: 06/16/2024 EXAM: MR Angiography Head without intravenous Contrast. 06/16/2024 04:27:37 AM TECHNIQUE: Magnetic resonance angiography images of the head without intravenous contrast. Multiplanar 2D and 3D reformatted images are provided for review. COMPARISON: None provided. CLINICAL HISTORY: Stroke, follow up. FINDINGS: ANTERIOR CIRCULATION: Mild stenosis of the ascending petrous segment of the right internal carotid artery. No significant stenosis of the left internal carotid artery. The right A1 segment is hypoplastic. No significant stenosis of the left anterior cerebral artery. No significant stenosis of the middle cerebral arteries. No aneurysm. POSTERIOR CIRCULATION: The left vertebral artery is dominant. No  significant stenosis of the right vertebral artery. No significant stenosis of the posterior cerebral arteries. No significant stenosis of the basilar artery. No aneurysm. IMPRESSION: 1. Mild stenosis of the ascending petrous segment of  the right internal carotid artery. 2. Hypoplastic right A1 segment. 3. Dominant left vertebral artery. Electronically signed by: Evalene Coho MD 06/16/2024 04:47 AM EST RP Workstation: [id number]   MR BRAIN WO CONTRAST Result Date: 06/15/2024 CLINICAL DATA:  Initial evaluation for acute neuro deficit, stroke suspected. EXAM: MRI HEAD WITHOUT CONTRAST TECHNIQUE: Multiplanar, multiecho pulse sequences of the brain and surrounding structures were obtained without intravenous contrast. COMPARISON:  Comparison made with prior MRI from 06/14/2024. FINDINGS: Brain: Cerebral volume within normal limits. Patchy T2/FLAIR hyperintensity involving the periventricular deep white matter, consistent with chronic small vessel ischemic disease, mild for age. Approximate 4.4 cm area of restricted diffusion seen involving the parafalcine left frontoparietal region, consistent with an acute left ACA distribution infarct (series 5, image 68). No associated hemorrhage or mass effect. No other evidence for acute or subacute ischemia. Gray-white matter differentiation otherwise maintained. No other areas of chronic cortical infarction. No acute or chronic intracranial blood products. No mass lesion, midline shift or mass effect no hydrocephalus or extra-axial fluid collection. Pituitary gland and suprasellar region within normal limits. Vascular: Major intracranial vascular flow voids are maintained. Hypoplastic right vertebral artery noted. Skull and upper cervical spine: Craniocervical junction within normal limits. Bone marrow signal intensity overall within normal limits for no scalp soft tissue abnormality. Sinuses/Orbits: Globes and orbital soft tissues within normal limits. Scattered retention cysts noted about the maxillary sinuses. Paranasal sinuses are otherwise largely clear. No significant mastoid effusion. Other: None. IMPRESSION: 1. 4.4 cm acute left ACA distribution infarct. No associated hemorrhage or mass effect. 2.  Underlying mild chronic microvascular ischemic disease. Electronically Signed   By: Morene Hoard M.D.   On: 06/15/2024 23:25   CT CERVICAL SPINE WO CONTRAST Result Date: 06/15/2024 CLINICAL DATA:  Eric Cummings EXAM: CT CERVICAL SPINE WITHOUT CONTRAST TECHNIQUE: Multidetector CT imaging of the cervical spine was performed without intravenous contrast. Multiplanar CT image reconstructions were also generated. RADIATION DOSE REDUCTION: This exam was performed according to the departmental dose-optimization program which includes automated exposure control, adjustment of the mA and/or kV according to patient size and/or use of iterative reconstruction technique. COMPARISON:  None Available. FINDINGS: Alignment: Alignment is anatomic. Skull base and vertebrae: No acute fracture. No primary bone lesion or focal pathologic process. Soft tissues and spinal canal: No prevertebral fluid or swelling. No visible canal hematoma. Disc levels: Mild multilevel spondylosis most pronounced at C3-4. Mild right predominant facet hypertrophy from C3-4 through C6-7. No significant bony encroachment upon the central canal or neural foramina. Upper chest: Airway is patent.  Lung apices are clear. Other: Reconstructed images demonstrate no additional findings. IMPRESSION: 1. No acute cervical spine fracture. 2. Mild multilevel cervical degenerative changes. Electronically Signed   By: Ozell Daring M.D.   On: 06/15/2024 16:18    Vitals:   06/16/24 0520 06/16/24 0550 06/16/24 0823 06/16/24 0945  BP:   127/70   Pulse: 61 60 63 66  Resp: 15 15 16 18   Temp:   98.3 F (36.8 C)   TempSrc:   Oral   SpO2: 99% 98% 99% 99%  Weight:      Height:         PHYSICAL EXAM General:  Alert, well-nourished, well-developed patient in no acute distress Psych:  Mood and affect appropriate for situation CV: Regular rate  and rhythm on monitor, some PACs Respiratory:  Regular, unlabored respirations on room air   NEURO:  Mental Status:  Responds to name and follows simple commands Speech/Language: Nonverbal at baseline  Cranial Nerves:  II: PERRL.  Blinks to threat bilaterally III, IV, VI: EOMI. Eyelids elevate symmetrically.  VII: Face is symmetrical resting and smiling VIII: hearing intact to voice. XII: tongue is midline without fasciculations. Motor: Able to move bilateral upper extremities and lower extremities with antigravity strength, but weakness noted in right upper and lower extremity Tone: is normal and bulk is normal Sensation- Intact to noxious throughout Coordination: Unable to perform Gait- deferred  Most Recent NIH  1a Level of Conscious.: 0 1b LOC Questions: 2 1c LOC Commands: 0 2 Best Gaze: 0 3 Visual: 0 4 Facial Palsy: 0 5a Motor Arm - left: 0 5b Motor Arm - Right: 0 6a Motor Leg - Left: 0 6b Motor Leg - Right: 2 7 Limb Ataxia: 0 8 Sensory: 0 9 Best Language: 3 10 Dysarthria: 2 11 Extinct. and Inatten.: 0 TOTAL: 9   ASSESSMENT/PLAN  Eric Cummings is a 59 y.o. male with history of autism, nonverbal at baseline, diabetes, peripheral neuropathy and gait difficulty who originally presented with right side bleeding and right-sided weakness.  Initial MRI was negative for acute infarct, but patient was then noted to go into A-fib with RVR.  Repeat MRI demonstrated left ACA stroke.  NIH on Admission 9  Acute Ischemic Infarct:  left ACA territory infarct Etiology: Cardioembolic in the setting of A-fib Code Stroke CT head No acute abnormality. ASPECTS 10.    MRI 1/14 no acute abnormality MRI 1/15 left ACA distribution infarct, underlying chronic microvascular ischemic disease MRA mild stenosis of petrous segment of right ICA, hypoplastic right A1 segment Carotid Doppler pending 2D Echo pending LDL 49 HgbA1c 8.7 VTE prophylaxis -fully anticoagulated with Eliquis  No antithrombotic prior to admission, now on Eliquis  (apixaban ) daily  Therapy recommendations:  SNF Disposition:  SNF  Atrial fibrillation Home Meds: None Continue telemetry monitoring Begin anticoagulation with Eliquis   Hypertension Home meds: Amlodipine  10 mg daily, olmesartan 20 mg daily Stable Blood Pressure Goal: BP less than 220/110   Hyperlipidemia Home meds: Fenofibrate  160 mg daily, resumed in hospital LDL 49, goal < 70 High intensity statin not indicated as LDL below goal Continue statin at discharge  Diabetes type II poorly controlled Home meds: Metformin  500 mg twice daily, Januvia 100 mg daily, Jardiance  10 mg daily, insulin  glargine 25 units daily at bedtime, insulin  lispro sliding scale HgbA1c 8.7, goal < 7.0 CBGs SSI Recommend close follow-up with PCP for better DM control  Dysphagia Patient has post-stroke dysphagia, SLP consulted    Diet   Diet NPO time specified   Advance diet as tolerated  Other Stroke Risk Factors None   Other Active Problems Autism-nonverbal at baseline  Hospital day # 1  Patient seen by NP with MD, MD to edit note as needed. Cortney E Everitt Clint Kill , MSN, AGACNP-BC [facility] See Amion for schedule and pager information 06/16/2024 10:53 AM   I have personally obtained history,examined this patient, reviewed notes, independently viewed imaging studies, participated in medical decision making and plan of care.ROS completed by me personally and pertinent positives fully documented  I have made any additions or clarifications directly to the above note. Agree with note above.  Patient has severe autism and is mute at baseline presented with sudden onset of right-sided weakness at the daycare.  Initial  exam was not convincing for stroke but MRI was obtained which was negative for stroke.  Overnight event into A-fib likely embolized as repeat MRI shows embolic left ACA branch infarct.  Clinical exam is limited due to his mental status but does suggest mild weakness in the right upper and lower extremity.  Recommend Eliquis  for stroke  prevention and continue ongoing stroke workup and aggressive risk factor modification.  Long discussion with patient's family at the bedside and answered questions.  Discussed with Dr. Larance   I personally spent a total of 50 minutes in the care of the patient today including getting/reviewing separately obtained history, performing a medically appropriate exam/evaluation, counseling and educating, placing orders, referring and communicating with other health care professionals, documenting clinical information in the EHR, independently interpreting results, and coordinating care.        Eather Popp, MD Medical Director [facility] Pager: [phone] 06/16/2024 2:55 PM  To contact Stroke Continuity provider, please refer to [email or web address]. After hours, contact General Neurology

## 2024-06-16 NOTE — Progress Notes (Signed)
 SLP Cancellation Note  Patient Details Name: Eric Cummings MRN: [id number] DOB: 1966/02/18   Cancelled treatment:        Orders for cognitive linguistic assessment received and appreciated.  Pt is non verbal at baseline 2/2 ASD.  Pt did follow directions for OME during swallow evaluation.  Spoke with family in room. She denies changes and pt appears to be at his communication baseline.  SLP will defer formal evaluation.   Anette FORBES Grippe, MA, CCC-SLP Acute Rehabilitation Services Office: [phone] 06/16/2024, 11:21 AM

## 2024-06-16 NOTE — Progress Notes (Signed)
 Carotid duplex has been completed.   Results can be found under chart review under CV PROC. 06/16/2024 4:35 PM Krina Mraz RVT, RDMS

## 2024-06-16 NOTE — Evaluation (Signed)
 Clinical/Bedside Swallow Evaluation Patient Details  Name: Eric Cummings MRN: [id number] Date of Birth: July 25, 1965  Today's Date: 06/16/2024 Time: SLP Start Time (ACUTE ONLY): 1104 SLP Stop Time (ACUTE ONLY): 1110 SLP Time Calculation (min) (ACUTE ONLY): 6 min  Past Medical History:  Past Medical History:  Diagnosis Date   Diabetes mellitus without complication (HCC)    Eczema    Urticaria    Past Surgical History: No past surgical history on file. HPI:  Eric Cummings is a 59 y.o. male who was brought to ED on 1/14 as a code stroke.  MRI 1/15: 4.4 cm acute left ACA distribution infarct. No associated hemorrhage or mass effect. 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    Assessment / Plan / Recommendation  Clinical Impression  Pt presents with normal swallow function as assessed clinically.  Pt tolerated all consistencies trialed including very large, rapid straw sips of thin liquid with no clinical s/s of aspiration. Pt consumed ~8oz of water in 3-4 sips. Pt exhibited excellent oral clearance of large trials of solids.  Family confirms pt consumes regular texture diet at baseline.  The incident with medications overnight is the first time she has ever seen him choke.  Pt has no further ST needs. SLP will sign off.   Recommend regular texture diet with thin liquids.   SLP Visit Diagnosis: Dysphagia, unspecified (R13.10)    Aspiration Risk  No limitations    Diet Recommendation Regular;Thin liquid    Medication Administration:  (As tolerated, no specific precautions.) Compensations: Slow rate;Small sips/bites Postural Changes: Seated upright at 90 degrees    Other Recommendations Oral Care Recommendations: Oral care BID     Swallow Evaluation Recommendations  N/A   Assistance Recommended at Discharge  N/A  Functional Status Assessment Patient has  not had a recent decline in their functional status  Frequency and Duration  (N/A)          Prognosis Prognosis for improved oropharyngeal function:  (N/A)      Swallow Study   General Date of Onset: 06/14/24 HPI: Eric Cummings is a 59 y.o. male who was brought to ED on 1/14 as a code stroke.  MRI 1/15: 4.4 cm acute left ACA distribution infarct. No associated hemorrhage or mass effect. 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 Type of Study: Bedside Swallow Evaluation Previous Swallow Assessment: none Diet Prior to this Study: NPO Temperature Spikes Noted: No Respiratory Status: Room air History of Recent Intubation: No Behavior/Cognition: Alert;Cooperative;Pleasant mood;Requires cueing Oral Cavity Assessment: Within Functional Limits Oral Care Completed by SLP: No Oral Cavity - Dentition: Adequate natural dentition Self-Feeding Abilities: Needs assist Patient Positioning: Upright in bed Baseline Vocal Quality: Not observed Volitional Cough: Weak Volitional Swallow: Unable to elicit    Oral/Motor/Sensory Function Overall Oral Motor/Sensory Function: Within functional limits Facial ROM: Within Functional Limits Facial Symmetry: Within Functional Limits Lingual ROM: Within Functional Limits Lingual Symmetry: Within Functional Limits Lingual Strength:  (unable to test) Velum:  (unable to test) Mandible: Within Functional Limits   Ice Chips Ice chips: Not tested   Thin Liquid Thin Liquid: Within functional limits Presentation: Straw    Nectar Thick Nectar Thick Liquid: Not tested   Honey Thick Honey Thick Liquid: Not  tested   Puree Puree: Within functional limits Presentation: Spoon   Solid     Solid: Within functional limits Presentation:  (SLP fed)      Anette FORBES Grippe, MA, CCC-SLP Acute Rehabilitation Services Office:  [phone] 06/16/2024,11:20 AM

## 2024-06-16 NOTE — Progress Notes (Signed)
 " PROGRESS NOTE    Eric Cummings  FMW:[id number] DOB: 1966-03-18 DOA: 06/14/2024 PCP: Onita Rush, MD   Brief Narrative:  HPI: Eric Cummings is a 59 y.o. 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  Patient is non verbal and unable to get any history. Patient is able to follow some simple commands. Acute stroke ruled out with a negative CT head. Mri Brain was incomplete study due to motion degradation.  He had PT eval  and was to be discharged to SNF when bed available.  But this morning patient went into afib with RVR with rates in 150/min and he was referred to [facility] for admission.  Neurology recommended getting a repeat MRI brain and CT cervical spine before starting anti coagulation.  Family at bedside and was able to provide the above history.    Cardiology and Neurology on board.  [facility] admitted for admission.   Assessment & Plan:   Principal Problem:   Atrial fibrillation, new onset (HCC) Active Problems:   Acute CVA (cerebrovascular accident) (HCC)  Acute ischemic stroke: Brought in with right-sided weakness, initial MRI negative due to motion, repeat MRI confirmed acute infarct in the left ACA territory.  MRI head and neck negative for any LVO.  Patient mute due to autism at baseline.  Neurology following.  Echo and vascular ultrasound Doppler pending.  Doppler lower extremity negative for DVT.  Patient n.p.o., per nurse, patient did not pass bedside swallow.  SLP has been consulted.  Patient has already been evaluated by PT OT, they recommended SNF.  Patient had a bed available at [facility] yesterday however per TOC, that bed was given away and there will be no bed available until Monday for him.  Due to ASD, he does not have any other bed offers either.  Patient has now been started on Eliquis  due to new onset atrial  fibrillation.  Per neurology, no aspirin  or Plavix.  LDL 49, at goal, patient on fenofibrate  PTA which has been resumed.  Hypertension: Blood pressure controlled.  On amlodipine  and olmesartan PTA which has been resumed.  New onset atrial fibrillation: Patient developed new onset atrial fibrillation while in the ED on 06/15/2024.  Started on Cardizem , cardiology consulted.  CHA2DS2-VASc 3.  Started on Eliquis .  Cardizem  drip weaned off, started on p.o. Cardizem , rates controlled.  Echo pending.  Type 2 diabetes mellitus: PTA medication include Lantus  25 units at bedtime and Humalog Premeal regimen, metformin  as well as Januvia.  Currently on Januvia and SSI only.  Slightly hyperglycemic, will start on Lantus  10 units.  Fall: Secondary to stroke.  CT cervical spine negative for fractures.  History of autism and being nonverbal at baseline.  Noted.  Resumed Zoloft .  DVT prophylaxis: Eliquis    Code Status: Full Code  Family Communication: Sister present at bedside.  Plan of care discussed with patient in length and he/she verbalized understanding and agreed with it.  Status is: Inpatient Remains inpatient appropriate because: Needs echo, vascular ultrasound and does not have the bed at SNF today.   Estimated body mass index is 23.41 kg/m as calculated from the following:   Height as of this encounter: 5' 10 (1.778 m).   Weight as of this encounter: 74 kg.    Nutritional Assessment: Body mass index is 23.41 kg/m.SABRA  Seen by dietician.  I agree with the assessment and plan as outlined below: Nutrition Status:        . Skin Assessment: I have examined the patient's skin and I agree with the wound assessment as performed by the wound care RN as outlined below:    Consultants:  Neurology and cardiology  Procedures:  None  Antimicrobials:  Anti-infectives (From admission, onward)    None         Subjective: Patient seen and examined, sister at the bedside.  Patient mute  at baseline due to ASD.  Trying to follow commands though.  Objective: Vitals:   06/16/24 0520 06/16/24 0550 06/16/24 0823 06/16/24 0945  BP:   127/70   Pulse: 61 60 63 66  Resp: 15 15 16 18   Temp:   98.3 F (36.8 C)   TempSrc:   Oral   SpO2: 99% 98% 99% 99%  Weight:      Height:       No intake or output data in the 24 hours ending 06/16/24 1010 Filed Weights   06/14/24 1400 06/14/24 1431  Weight: 73.4 kg 74 kg    Examination:  General exam: Appears calm and comfortable  Respiratory system: Clear to auscultation. Respiratory effort normal. Cardiovascular system: S1 & S2 heard, RRR. No JVD, murmurs, rubs, gallops or clicks. No pedal edema. Gastrointestinal system: Abdomen is nondistended, soft and nontender. No organomegaly or masses felt. Normal bowel sounds heard. Central nervous system: Alert, unable to assess orientation due to being mute.  Appears to have some right-sided weakness.  Data Reviewed: I have personally reviewed following labs and imaging studies  CBC: Recent Labs  Lab 06/14/24 1400 06/14/24 1402 06/16/24 0322  WBC 7.2  --  10.1  NEUTROABS 4.4  --  8.1*  HGB 15.0 16.3 14.2  HCT 46.9 48.0 43.6  MCV 88.2  --  86.3  PLT 246  --  254   Basic Metabolic Panel: Recent Labs  Lab 06/14/24 1400 06/14/24 1402 06/16/24 0031  NA 143 142 133*  K 5.3* 5.1 4.7  CL 103 103 97*  CO2 29  --  22  GLUCOSE 260* 251* 212*  BUN 34* 41* 31*  CREATININE 1.22 1.30* 1.22  CALCIUM 9.9  --  9.2   GFR: Estimated Creatinine Clearance: 68.1 mL/min (by C-G formula based on SCr of 1.22 mg/dL). Liver Function Tests: Recent Labs  Lab 06/14/24 1400  AST 36  ALT 45*  ALKPHOS 113  BILITOT 0.5  PROT 7.7  ALBUMIN 4.5   No results for input(s): LIPASE, AMYLASE in the last 168 hours. No results for input(s): AMMONIA in the last 168 hours. Coagulation Profile: Recent Labs  Lab 06/14/24 1400  INR 1.0   Cardiac Enzymes: No results for input(s): CKTOTAL,  CKMB, CKMBINDEX, TROPONINI in the last 168 hours. BNP (last 3 results) No results for input(s): PROBNP in the last 8760 hours. HbA1C: Recent Labs    06/15/24 0137  HGBA1C 8.7*   CBG: Recent Labs  Lab 06/15/24 0738 06/15/24 1314 06/15/24 1637 06/15/24 2124 06/16/24 0820  GLUCAP 199* 226* 216* 223* 177*   Lipid Profile: Recent Labs    06/16/24 0031  CHOL 111  HDL 42  LDLCALC 49  TRIG 101  CHOLHDL 2.7   Thyroid  Function Tests: No results for input(s): TSH, T4TOTAL, FREET4, T3FREE, THYROIDAB in the last 72 hours. Anemia Panel: No results for input(s): VITAMINB12, FOLATE, FERRITIN, TIBC, IRON, RETICCTPCT in the last 72 hours. Sepsis Labs: No  results for input(s): PROCALCITON, LATICACIDVEN in the last 168 hours.  No results found for this or any previous visit (from the past 240 hours).   Radiology Studies: MR ANGIO NECK W WO CONTRAST Result Date: 06/16/2024 EXAM: MRA Neck without and with contrast 06/16/2024 04:27:52 AM TECHNIQUE: Multiplanar multisequence MRA of the neck was performed without and with the administration of 7 mL of gadobutrol  (GADAVIST ) 1 MMOL/ML injection. 2D and 3D reformatted images are provided for review. Stenosis of the internal carotid arteries is measured using NASCET criteria. COMPARISON: None available CLINICAL HISTORY: stroke FINDINGS: CAROTID ARTERIES: The cervical segments of the internal carotid arteries are moderately tortuous bilaterally, but normal in caliber. No dissection. No hemodynamically significant stenosis by NASCET criteria. VERTEBRAL ARTERIES: The left vertebral artery is dominant and the right vertebral artery is relatively hypoplastic. No dissection. No significant stenosis. IMPRESSION: 1. No hemodynamically significant stenosis or dissection of the neck arteries. 2. Moderate tortuosity of the cervical segments of the internal carotid arteries bilaterally, otherwise normal in caliber. 3. Dominant left  vertebral artery with relatively hypoplastic right vertebral artery. Electronically signed by: Evalene Coho MD 06/16/2024 04:50 AM EST RP Workstation: [id number]   MR ANGIO HEAD WO CONTRAST Result Date: 06/16/2024 EXAM: MR Angiography Head without intravenous Contrast. 06/16/2024 04:27:37 AM TECHNIQUE: Magnetic resonance angiography images of the head without intravenous contrast. Multiplanar 2D and 3D reformatted images are provided for review. COMPARISON: None provided. CLINICAL HISTORY: Stroke, follow up. FINDINGS: ANTERIOR CIRCULATION: Mild stenosis of the ascending petrous segment of the right internal carotid artery. No significant stenosis of the left internal carotid artery. The right A1 segment is hypoplastic. No significant stenosis of the left anterior cerebral artery. No significant stenosis of the middle cerebral arteries. No aneurysm. POSTERIOR CIRCULATION: The left vertebral artery is dominant. No significant stenosis of the right vertebral artery. No significant stenosis of the posterior cerebral arteries. No significant stenosis of the basilar artery. No aneurysm. IMPRESSION: 1. Mild stenosis of the ascending petrous segment of the right internal carotid artery. 2. Hypoplastic right A1 segment. 3. Dominant left vertebral artery. Electronically signed by: Evalene Coho MD 06/16/2024 04:47 AM EST RP Workstation: [id number]   MR BRAIN WO CONTRAST Result Date: 06/15/2024 CLINICAL DATA:  Initial evaluation for acute neuro deficit, stroke suspected. EXAM: MRI HEAD WITHOUT CONTRAST TECHNIQUE: Multiplanar, multiecho pulse sequences of the brain and surrounding structures were obtained without intravenous contrast. COMPARISON:  Comparison made with prior MRI from 06/14/2024. FINDINGS: Brain: Cerebral volume within normal limits. Patchy T2/FLAIR hyperintensity involving the periventricular deep white matter, consistent with chronic small vessel ischemic disease, mild for age. Approximate 4.4 cm  area of restricted diffusion seen involving the parafalcine left frontoparietal region, consistent with an acute left ACA distribution infarct (series 5, image 68). No associated hemorrhage or mass effect. No other evidence for acute or subacute ischemia. Gray-white matter differentiation otherwise maintained. No other areas of chronic cortical infarction. No acute or chronic intracranial blood products. No mass lesion, midline shift or mass effect no hydrocephalus or extra-axial fluid collection. Pituitary gland and suprasellar region within normal limits. Vascular: Major intracranial vascular flow voids are maintained. Hypoplastic right vertebral artery noted. Skull and upper cervical spine: Craniocervical junction within normal limits. Bone marrow signal intensity overall within normal limits for no scalp soft tissue abnormality. Sinuses/Orbits: Globes and orbital soft tissues within normal limits. Scattered retention cysts noted about the maxillary sinuses. Paranasal sinuses are otherwise largely clear. No significant mastoid effusion. Other: None. IMPRESSION: 1. 4.4 cm acute left  ACA distribution infarct. No associated hemorrhage or mass effect. 2. Underlying mild chronic microvascular ischemic disease. Electronically Signed   By: Morene Hoard M.D.   On: 06/15/2024 23:25   CT CERVICAL SPINE WO CONTRAST Result Date: 06/15/2024 CLINICAL DATA:  [place] EXAM: CT CERVICAL SPINE WITHOUT CONTRAST TECHNIQUE: Multidetector CT imaging of the cervical spine was performed without intravenous contrast. Multiplanar CT image reconstructions were also generated. RADIATION DOSE REDUCTION: This exam was performed according to the departmental dose-optimization program which includes automated exposure control, adjustment of the mA and/or kV according to patient size and/or use of iterative reconstruction technique. COMPARISON:  None Available. FINDINGS: Alignment: Alignment is anatomic. Skull base and vertebrae: No acute  fracture. No primary bone lesion or focal pathologic process. Soft tissues and spinal canal: No prevertebral fluid or swelling. No visible canal hematoma. Disc levels: Mild multilevel spondylosis most pronounced at C3-4. Mild right predominant facet hypertrophy from C3-4 through C6-7. No significant bony encroachment upon the central canal or neural foramina. Upper chest: Airway is patent.  Lung apices are clear. Other: Reconstructed images demonstrate no additional findings. IMPRESSION: 1. No acute cervical spine fracture. 2. Mild multilevel cervical degenerative changes. Electronically Signed   By: Ozell Daring M.D.   On: 06/15/2024 16:18   VAS US  LOWER EXTREMITY VENOUS (DVT) (7a-5p) Result Date: 06/15/2024  Lower Venous DVT Study Patient Name:  ELZIE KNISLEY  Date of Exam:   06/15/2024 Medical Rec #: [id number]            Accession #:    [id number] Date of Birth: February 03, 1966             Patient Gender: M Patient Age:   23 years Exam Location:  [facility] Procedure:      VAS US  LOWER EXTREMITY VENOUS (DVT) Referring Phys: ADAM CURATOLO --------------------------------------------------------------------------------  Indications: Pain.  Limitations: Suboptimal positioning. Comparison Study: No previous exams Performing Technologist: Jody Hill RVT, RDMS  Examination Guidelines: A complete evaluation includes B-mode imaging, spectral Doppler, color Doppler, and power Doppler as needed of all accessible portions of each vessel. Bilateral testing is considered an integral part of a complete examination. Limited examinations for reoccurring indications may be performed as noted. The reflux portion of the exam is performed with the patient in reverse Trendelenburg.  +---------+---------------+---------+-----------+----------+--------------+ RIGHT    CompressibilityPhasicitySpontaneityPropertiesThrombus Aging +---------+---------------+---------+-----------+----------+--------------+ CFV      Full            Yes      Yes                                 +---------+---------------+---------+-----------+----------+--------------+ SFJ      Full                                                        +---------+---------------+---------+-----------+----------+--------------+ FV Prox  Full           Yes      Yes                                 +---------+---------------+---------+-----------+----------+--------------+ FV Mid   Full           Yes      Yes                                 +---------+---------------+---------+-----------+----------+--------------+  FV DistalFull           Yes      Yes                                 +---------+---------------+---------+-----------+----------+--------------+ PFV      Full                                                        +---------+---------------+---------+-----------+----------+--------------+ POP      Full           Yes      Yes                                 +---------+---------------+---------+-----------+----------+--------------+ PTV      Full                                                        +---------+---------------+---------+-----------+----------+--------------+ PERO     Full                                                        +---------+---------------+---------+-----------+----------+--------------+   +----+---------------+---------+-----------+----------+--------------+ LEFTCompressibilityPhasicitySpontaneityPropertiesThrombus Aging +----+---------------+---------+-----------+----------+--------------+ CFV Full           Yes      Yes                                 +----+---------------+---------+-----------+----------+--------------+     Summary: RIGHT: - There is no evidence of deep vein thrombosis in the lower extremity.  - No cystic structure found in the popliteal fossa.  LEFT: - No evidence of common femoral vein obstruction.   *See table(s) above for measurements  and observations. Electronically signed by Debby Robertson on 06/15/2024 at 2:12:55 PM.    Final    DG Lumbar Spine Complete Result Date: 06/14/2024 EXAM: 4 VIEW(S) XRAY OF THE LUMBAR SPINE 06/14/2024 06:25:00 PM COMPARISON: None available. CLINICAL HISTORY: PAIN PAIN PAIN FINDINGS: LUMBAR SPINE: BONES: Vertebral body heights are maintained. Alignment is normal. DISCS AND DEGENERATIVE CHANGES: Moderate facet arthrosis L3 - S1. SOFT TISSUES: No acute abnormality. IMPRESSION: 1. 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 Electronically signed by: Dorethia Molt MD 06/14/2024 06:29 PM EST RP Workstation: [id number]   DG Femur Portable Min 2 Views Right Result Date: 06/14/2024 EXAM: 2 VIEW(S) XRAY OF THE RIGHT FEMUR 06/14/2024 04:52:00 PM COMPARISON: None available. CLINICAL HISTORY: right hip? right hip? right hip? FINDINGS: BONES AND JOINTS: No acute fracture. No malalignment. SOFT TISSUES: Mild vascular calcifications. IMPRESSION: 1. No acute findings. Electronically signed by: Morgane Naveau MD 06/14/2024 05:03 PM EST RP Workstation: [id number]   DG Tibia/Fibula Right Result Date: 06/14/2024 CLINICAL DATA:  Possible right lower leg abnormality. No known injury. EXAM: RIGHT TIBIA AND FIBULA - 2 VIEW COMPARISON:  None Available. FINDINGS: There is no evidence of fracture  or other focal bone lesions. Soft tissues are unremarkable. IMPRESSION: Negative. Electronically Signed   By: Toribio Agreste M.D.   On: 06/14/2024 17:01   DG Pelvis Portable Result Date: 06/14/2024 CLINICAL DATA:  Right hip pain. EXAM: PORTABLE PELVIS 1-2 VIEWS COMPARISON:  None Available. FINDINGS: No acute fracture or dislocation on this single image. There is heterogeneous density over the right femoral head. The bones are well mineralized. No significant arthritic changes. The soft tissues are unremarkable. IMPRESSION: No acute fracture or dislocation. Electronically Signed   By: Vanetta Chou M.D.   On: 06/14/2024 16:58   EEG  adult Result Date: 06/14/2024 Shelton Arlin KIDD, MD     06/14/2024  4:39 PM Patient Name: PRATHAM CASSATT MRN: [id number] Epilepsy Attending: Arlin KIDD Shelton Referring Physician/Provider: Waddell Karna LABOR, NP Date: 06/15/2023 Duration: 22.56 mins Patient history: 59 y.o. 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 EEG to evaluate for seizure Level of alertness: Awake AEDs during EEG study: Ativan  Technical aspects: This EEG study was done with scalp electrodes positioned according to the 10-20 International system of electrode placement. Electrical activity was reviewed with band pass filter of 1-70Hz , sensitivity of 7 uV/mm, display speed of 41mm/sec with a 60Hz  notched filter applied as appropriate. EEG data were recorded continuously and digitally stored.  Video monitoring was available and reviewed as appropriate. Description: The posterior dominant rhythm consists of 10 Hz activity of moderate voltage (25-35 uV) seen predominantly in posterior head regions, symmetric and reactive to eye opening and eye closing. There is an excessive amount of 15 to 18 Hz beta activity distributed symmetrically and diffusely. Hyperventilation and photic stimulation were not performed.   ABNORMALITY - Excessive beta, generalized IMPRESSION: This study is within normal limits. The excessive beta activity seen in the background is most likely due to the effect of benzodiazepine and is a benign EEG pattern. No seizures or epileptiform discharges were seen throughout the recording. A normal interictal EEG does not exclude the diagnosis of epilepsy. Arlin KIDD Shelton   MR BRAIN WO CONTRAST Result Date: 06/14/2024 HISTORY: stroke EXAM: MR BRAIN WITHOUT CONTRAST TECHNIQUE: Multiplanar, multiecho pulse sequences of the brain and surrounding structures were obtained without  intravenous contrast. Axial and coronal DWI, axial FLAIR, and axial GRE sequences were obtained. COMPARISON:  Same date CT head FINDINGS: The study is incomplete as the patient could not tolerate completing the exam. Axial and coronal DWI, motion degraded axial FLAIR, and axial GRE sequences were obtained. There is no evidence of acute infarct on the axial DWI sequence. There is no definite evidence of acute intracranial hemorrhage. Parenchymal volume is within normal limits. The ventricles are normal in size. Mild FLAIR signal abnormality in the periventricular white matter is nonspecific but may reflect mild underlying chronic small vessel ischemic change. There is no definite mass lesion. There is no mass effect or midline shift. IMPRESSION: 1. Incomplete study with axial and coronal DWI, motion degraded axial FLAIR, and axial GRE sequences obtained. 2.  No evidence of acute intracranial pathology on the provided images. Electronically signed by: Maude Harry MD 06/14/2024 03:27 PM EST RP Workstation: [id number]   CT HEAD CODE STROKE WO CONTRAST Result Date: 06/14/2024 EXAM: CT HEAD WITHOUT CONTRAST 06/14/2024 02:06:59 PM TECHNIQUE: CT of the head was performed without the administration of intravenous contrast. Automated exposure control, iterative reconstruction, and/or  weight based adjustment of the mA/kV was utilized to reduce the radiation dose to as low as reasonably achievable. COMPARISON: None available. CLINICAL HISTORY: The patient presents with an acute neurological deficit, and a stroke is suspected. FINDINGS: BRAIN AND VENTRICLES: No acute hemorrhage. No evidence of acute infarct. No hydrocephalus. No extra-axial collection. No mass effect or midline shift. [place] Stroke Program Early CT (ASPECT) score: Ganglionic (caudate, IC, lentiform nucleus, insula, M1-M3): 7 Supraganglionic (M4-M6): 3 Total: 10 ORBITS: No acute abnormality. SINUSES: Polyps versus mucous retention cysts in the maxillary  sinuses. SOFT TISSUES AND SKULL: No acute soft tissue abnormality. No skull fracture. IMPRESSION: 1. No acute intracranial abnormality. 2. ASPECT score is 10. 3. Findings messaged to Dr. Rosemarie via the [facility] messaging system at 2:24 PM on 06/14/24. Electronically signed by: Donnice Mania MD 06/14/2024 02:24 PM EST RP Workstation: [id number]    Scheduled Meds:   stroke: early stages of recovery book   Does not apply Once   amLODipine   10 mg Oral Daily   apixaban   5 mg Oral BID   diltiazem   30 mg Oral Q6H   empagliflozin   10 mg Oral Daily   fenofibrate   160 mg Oral Daily   gabapentin   100 mg Oral QPC breakfast   gabapentin   300 mg Oral QHS   insulin  aspart  0-15 Units Subcutaneous TID WC   insulin  aspart  0-5 Units Subcutaneous QHS   irbesartan   150 mg Oral Daily   linagliptin   5 mg Oral Daily   metFORMIN   500 mg Oral BID WC   pantoprazole   40 mg Oral Daily   sertraline   100 mg Oral Daily   Continuous Infusions:  sodium chloride  40 mL/hr at 06/15/24 2201   diltiazem  (CARDIZEM ) infusion Stopped (06/16/24 9362)     LOS: 1 day   Fredia Skeeter, MD [facility]  06/16/2024, 10:10 AM   *Please note that this is a verbal dictation therefore any spelling or grammatical errors are due to the Dragon Medical One system interpretation.  Please page via Amion and do not message via secure chat for urgent patient care matters. Secure chat can be used for non urgent patient care matters.  How to contact the [facility] Attending or Consulting provider 7A - 7P or covering provider during after hours 7P -7A, for this patient?  Check the care team in [facility] and look for a) attending/consulting [facility] provider listed and b) the [facility] team listed. Page or secure chat 7A-7P. Log into [email or web address] and use [facility]'s universal password to access. If you do not have the password, please contact the hospital operator. Locate the [facility] provider you are looking for under [facility] and page to a number that  you can be directly reached. If you still have difficulty reaching the provider, please page the [facility] (Director on Call) for the Hospitalists listed on amion for assistance.  "

## 2024-06-17 DIAGNOSIS — I639 Cerebral infarction, unspecified: Secondary | ICD-10-CM | POA: Diagnosis not present

## 2024-06-17 DIAGNOSIS — E118 Type 2 diabetes mellitus with unspecified complications: Secondary | ICD-10-CM

## 2024-06-17 DIAGNOSIS — E1165 Type 2 diabetes mellitus with hyperglycemia: Secondary | ICD-10-CM | POA: Diagnosis not present

## 2024-06-17 DIAGNOSIS — Z794 Long term (current) use of insulin: Secondary | ICD-10-CM | POA: Diagnosis not present

## 2024-06-17 DIAGNOSIS — I4891 Unspecified atrial fibrillation: Secondary | ICD-10-CM | POA: Diagnosis not present

## 2024-06-17 LAB — GLUCOSE, CAPILLARY
Glucose-Capillary: 191 mg/dL — ABNORMAL HIGH (ref 70–99)
Glucose-Capillary: 247 mg/dL — ABNORMAL HIGH (ref 70–99)
Glucose-Capillary: 152 mg/dL — ABNORMAL HIGH (ref 70–99)
Glucose-Capillary: 146 mg/dL — ABNORMAL HIGH (ref 70–99)

## 2024-06-17 MED ORDER — METOPROLOL TARTRATE 25 MG PO TABS
25.0000 mg | ORAL_TABLET | Freq: Two times a day (BID) | ORAL | Status: DC
  Administered 2024-06-17 – 2024-06-19 (×5): 25 mg via ORAL
  Filled 2024-06-17 (×5): qty 1

## 2024-06-17 NOTE — Progress Notes (Signed)
 "   Progress Note  Patient Name: Eric Cummings Date of Encounter: 06/17/2024  Primary Cardiologist: Emeline FORBES Calender, DO  Subjective   No events.  Inpatient Medications    Scheduled Meds:   stroke: early stages of recovery book   Does not apply Once   apixaban   5 mg Oral BID   diltiazem   30 mg Oral Q6H   empagliflozin   10 mg Oral Daily   fenofibrate   160 mg Oral Daily   gabapentin   100 mg Oral QPC breakfast   gabapentin   300 mg Oral QHS   insulin  aspart  0-15 Units Subcutaneous TID WC   insulin  aspart  0-5 Units Subcutaneous QHS   insulin  glargine-yfgn  10 Units Subcutaneous Daily   irbesartan   150 mg Oral Daily   linagliptin   5 mg Oral Daily   metFORMIN   500 mg Oral BID WC   pantoprazole   40 mg Oral Daily   sertraline   100 mg Oral Daily   Continuous Infusions:  PRN Meds: acetaminophen  **OR** acetaminophen  (TYLENOL ) oral liquid 160 mg/5 mL **OR** acetaminophen , clobetasol  cream, senna-docusate   Vital Signs    Vitals:   06/17/24 0000 06/17/24 0200 06/17/24 0400 06/17/24 0746  BP: 129/77  123/77 128/82  Pulse: 65 67 71   Resp: 19 16 19    Temp: 97.6 F (36.4 C)  99.3 F (37.4 C) 98.4 F (36.9 C)  TempSrc: Oral  Oral Oral  SpO2: 93% 95% 93%   Weight:      Height:        Intake/Output Summary (Last 24 hours) at 06/17/2024 1121 Last data filed at 06/17/2024 0426 Gross per 24 hour  Intake 120 ml  Output 2750 ml  Net -2630 ml   Filed Weights   06/14/24 1400 06/14/24 1431  Weight: 73.4 kg 74 kg    Telemetry     Personally reviewed.  NSR with frequent PACs.  ECG    Not performed today.  Physical Exam   GEN: No acute distress.   Neck: No JVD. Cardiac: RRR, no murmur, rub, or gallop.  Respiratory: Nonlabored. Clear to auscultation bilaterally. GI: Soft, nontender, bowel sounds present. MS: No edema; No deformity. Neuro:  Nonfocal. Psych: Nonverbal  Labs    Chemistry Recent Labs  Lab 06/14/24 1400 06/14/24 1402 06/16/24 0031  NA 143 142  133*  K 5.3* 5.1 4.7  CL 103 103 97*  CO2 29  --  22  GLUCOSE 260* 251* 212*  BUN 34* 41* 31*  CREATININE 1.22 1.30* 1.22  CALCIUM 9.9  --  9.2  PROT 7.7  --   --   ALBUMIN 4.5  --   --   AST 36  --   --   ALT 45*  --   --   ALKPHOS 113  --   --   BILITOT 0.5  --   --   GFRNONAA >60  --  >60  ANIONGAP 11  --  13     Hematology Recent Labs  Lab 06/14/24 1400 06/14/24 1402 06/16/24 0322  WBC 7.2  --  10.1  RBC 5.32  --  5.05  HGB 15.0 16.3 14.2  HCT 46.9 48.0 43.6  MCV 88.2  --  86.3  MCH 28.2  --  28.1  MCHC 32.0  --  32.6  RDW 13.2  --  13.1  PLT 246  --  254    Cardiac EnzymesNo results for input(s): TROPONINIHS in the last 720 hours.  BNPNo results  for input(s): BNP, PROBNP in the last 168 hours.   DDimerNo results for input(s): DDIMER in the last 168 hours.   Assessment & Plan   Atrial fibrillation with RVR, new onset - Converted to NSR.  Telemetry reviewed, in NSR with frequent PACs. - Currently on p.o. diltiazem  30 mg every 6 hours.  Switch to metoprolol  tartrate 25 mg twice daily. - If he still continues to have PACs in the outpatient cardiology office visit, he will benefit from initiation of antiarrhythmics.  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 - Continue Eliquis  5 mg twice daily. - TSH normal.  Echo normal this admission.  Acute CVA - Neurology on board.  [facility] will sign off.   Medication Recommendations: Continue current medications Other recommendations (labs, testing, etc): None Follow up as an outpatient: Outpatient cardiology follow-up in 3 months  I spent 30 minutes in reviewing prior medical records, reports, more than 3 labs, discussion and documentation.  Signed, Diannah SHAUNNA Maywood, MD  06/17/2024, 11:21 AM    "

## 2024-06-17 NOTE — Progress Notes (Signed)
 "                        PROGRESS NOTE        PATIENT DETAILS Name: Eric Cummings Age: 59 y.o. Sex: male Date of Birth: 01-31-1966 Admit Date: 06/14/2024 Admitting Physician Redia LOISE Cleaver, MD ERE:Mlddn, Norleen, MD  Brief Summary: Patient is a 59 y.o.  male with history of autism-DM-2-who presented with right-sided weakness-A-fib RVR-found to have acute CVA.  Significant events: 1/15>> admit to [facility].  Significant studies: 01/14>> CT head: No acute endocrine abnormality 01/14>> MRI brain: Motion degraded exam but no acute abnormalities noted 01/14>> x-ray pelvis: No fracture/dislocation 01/14>> x-ray right femur: No acute findings 01/14>> x-ray right tibia/fibula: No acute findings 01/14>> x-ray L-spine: No acute findings 01/15>> A1c: 8.7 01/15>> CT C-spine: No acute fracture 01/15>> MRI brain: 4.4 cm left ACA distribution infarct. 01/16>> MRA head: Mild stenosis of petrous segment of right ICA. 01/16>> MRA neck: No stenosis/LVO. 01/16>> B/L carotid Doppler: No stenosis 01/16>> echo: EF 55-60%. 01/16>> LDL: 49  Significant microbiology data: None  Procedures: None  Consults: Neurology Cardiology  Subjective: Lying comfortably in bed-denies any chest pain or shortness of breath.  Nonverbal at baseline-brother at bedside-unchanged right-sided weakness compared to yesterday.  Objective: Vitals: Blood pressure 128/82, pulse 71, temperature 98.4 F (36.9 C), temperature source Oral, resp. rate 19, height 5' 10 (1.778 m), weight 74 kg, SpO2 93%.   Exam: Gen Exam:Alert awake-not in any distress.  Nonverbal at baseline. HEENT:atraumatic, normocephalic Chest: B/L clear to auscultation anteriorly CVS:S1S2 regular Abdomen:soft non tender, non distended Extremities:no edema Neurology: Mild right-sided weakness-appears to be more severe in right upper extremity compared to left lower extremity. Skin: no rash  Pertinent Labs/Radiology:    Latest Ref Rng & Units  06/16/2024    3:22 AM 06/14/2024    2:02 PM 06/14/2024    2:00 PM  CBC  WBC 4.0 - 10.5 K/uL 10.1   7.2   Hemoglobin 13.0 - 17.0 g/dL 85.7  83.6  84.9   Hematocrit 39.0 - 52.0 % 43.6  48.0  46.9   Platelets 150 - 400 K/uL 254   246     Lab Results  Component Value Date   NA 133 (L) 06/16/2024   K 4.7 06/16/2024   CL 97 (L) 06/16/2024   CO2 22 06/16/2024      Assessment/Plan: Acute left ACA infarct Embolic etiology-in the setting of newly discovered A-fib Unchanged right-sided hemiparesis Workup as above Continue Eliquis  SNF planned-TOC following-tentatively on Monday.  PAF with RVR Sinus rhythm Cardizem /Eliquis  Cardiology following  Mechanical fall Likely secondary to CVA/right-sided weakness Numerous imaging studies negative for any significant injuries.  HTN Initially permissive hypertension allowed Since on Cardizem -Will stop amlodipine  Continue Avapro  Follow/optimize  DM-2 CBGs relatively stable Semglee  10 units daily+ SSI Continue Tradjenta /metformin /Jardiance  Follow.  Recent Labs    06/16/24 1935 06/16/24 2220 06/17/24 0745  GLUCAP 269* 209* 191*    GERD PPI  Mood disorder Zoloft   History of autism/nonverbal at baseline Supportive care  Code status:   Code Status: Full Code   DVT Prophylaxis: apixaban  (ELIQUIS ) tablet 5 mg     Family Communication:Brother at bedside   Disposition Plan: Status is: Inpatient Remains inpatient appropriate because: Severity of illness   Planned Discharge Destination:Skilled nursing facility   Diet: Diet Order             Diet Carb Modified Room service appropriate? Yes with Assist  Diet effective  now                     Antimicrobial agents: Anti-infectives (From admission, onward)    None        MEDICATIONS: Scheduled Meds:   stroke: early stages of recovery book   Does not apply Once   apixaban   5 mg Oral BID   diltiazem   30 mg Oral Q6H   empagliflozin   10 mg Oral Daily    fenofibrate   160 mg Oral Daily   gabapentin   100 mg Oral QPC breakfast   gabapentin   300 mg Oral QHS   insulin  aspart  0-15 Units Subcutaneous TID WC   insulin  aspart  0-5 Units Subcutaneous QHS   insulin  glargine-yfgn  10 Units Subcutaneous Daily   irbesartan   150 mg Oral Daily   linagliptin   5 mg Oral Daily   metFORMIN   500 mg Oral BID WC   pantoprazole   40 mg Oral Daily   sertraline   100 mg Oral Daily   Continuous Infusions:  diltiazem  (CARDIZEM ) infusion Stopped (06/16/24 0637)   PRN Meds:.acetaminophen  **OR** acetaminophen  (TYLENOL ) oral liquid 160 mg/5 mL **OR** acetaminophen , clobetasol  cream, senna-docusate   I have personally reviewed following labs and imaging studies  LABORATORY DATA: CBC: Recent Labs  Lab 06/14/24 1400 06/14/24 1402 06/16/24 0322  WBC 7.2  --  10.1  NEUTROABS 4.4  --  8.1*  HGB 15.0 16.3 14.2  HCT 46.9 48.0 43.6  MCV 88.2  --  86.3  PLT 246  --  254    Basic Metabolic Panel: Recent Labs  Lab 06/14/24 1400 06/14/24 1402 06/16/24 0031  NA 143 142 133*  K 5.3* 5.1 4.7  CL 103 103 97*  CO2 29  --  22  GLUCOSE 260* 251* 212*  BUN 34* 41* 31*  CREATININE 1.22 1.30* 1.22  CALCIUM 9.9  --  9.2    GFR: Estimated Creatinine Clearance: 68.1 mL/min (by C-G formula based on SCr of 1.22 mg/dL).  Liver Function Tests: Recent Labs  Lab 06/14/24 1400  AST 36  ALT 45*  ALKPHOS 113  BILITOT 0.5  PROT 7.7  ALBUMIN 4.5   No results for input(s): LIPASE, AMYLASE in the last 168 hours. No results for input(s): AMMONIA in the last 168 hours.  Coagulation Profile: Recent Labs  Lab 06/14/24 1400  INR 1.0    Cardiac Enzymes: No results for input(s): CKTOTAL, CKMB, CKMBINDEX, TROPONINI in the last 168 hours.  BNP (last 3 results) No results for input(s): PROBNP in the last 8760 hours.  Lipid Profile: Recent Labs    06/16/24 0031  CHOL 111  HDL 42  LDLCALC 49  TRIG 101  CHOLHDL 2.7    Thyroid  Function  Tests: No results for input(s): TSH, T4TOTAL, FREET4, T3FREE, THYROIDAB in the last 72 hours.  Anemia Panel: No results for input(s): VITAMINB12, FOLATE, FERRITIN, TIBC, IRON, RETICCTPCT in the last 72 hours.  Urine analysis: No results found for: COLORURINE, APPEARANCEUR, LABSPEC, PHURINE, GLUCOSEU, HGBUR, BILIRUBINUR, KETONESUR, PROTEINUR, UROBILINOGEN, NITRITE, LEUKOCYTESUR  Sepsis Labs: Lactic Acid, Venous No results found for: LATICACIDVEN  MICROBIOLOGY: No results found for this or any previous visit (from the past 240 hours).  RADIOLOGY STUDIES/RESULTS: ECHOCARDIOGRAM COMPLETE Result Date: 06/16/2024    ECHOCARDIOGRAM REPORT   Patient Name:   Eric Cummings Date of Exam: 06/16/2024 Medical Rec #:  [id number]           Height:       70.0 in Accession #:  [id number]          Weight:       163.1 lb Date of Birth:  [birth year]            BSA:          1.914 m Patient Age:    58 years            BP:           133/74 mmHg Patient Gender: M                   HR:           74 bpm. Exam Location:  Inpatient Procedure: 2D Echo, Cardiac Doppler and Color Doppler (Both Spectral and Color            Flow Doppler were utilized during procedure). Indications:    Atrial Fibrillation I48.91  History:        Patient has no prior history of Echocardiogram examinations.                 CVA, Arrythmias:Atrial Fibrillation; Risk Factors:Diabetes,                 Dyslipidemia and Hypertension.  Sonographer:    Koleen Popper RDCS Referring Phys: VIJAYA AKULA IMPRESSIONS  1. Left ventricular ejection fraction, by estimation, is 55 to 60%. The left ventricle has normal function. The left ventricle has no regional wall motion abnormalities. Left ventricular diastolic parameters were normal.  2. Right ventricular systolic function is normal. The right ventricular size is normal. There is normal pulmonary artery systolic pressure.  3. Trivial mitral valve  regurgitation.  4. The aortic valve is tricuspid. Aortic valve regurgitation is not visualized. Aortic valve sclerosis/calcification is present, without any evidence of aortic stenosis.  5. The inferior vena cava is normal in size with greater than 50% respiratory variability, suggesting right atrial pressure of 3 mmHg. FINDINGS  Left Ventricle: Left ventricular ejection fraction, by estimation, is 55 to 60%. The left ventricle has normal function. The left ventricle has no regional wall motion abnormalities. The left ventricular internal cavity size was normal in size. There is  no left ventricular hypertrophy. Left ventricular diastolic parameters were normal. Right Ventricle: The right ventricular size is normal. Right vetricular wall thickness was not assessed. Right ventricular systolic function is normal. There is normal pulmonary artery systolic pressure. The tricuspid regurgitant velocity is 2.71 m/s, and with an assumed right atrial pressure of 3 mmHg, the estimated right ventricular systolic pressure is 32.4 mmHg. Left Atrium: Left atrial size was normal in size. Right Atrium: Right atrial size was normal in size. Pericardium: There is no evidence of pericardial effusion. Mitral Valve: There is mild thickening of the mitral valve leaflet(s). Trivial mitral valve regurgitation. Tricuspid Valve: The tricuspid valve is normal in structure. Tricuspid valve regurgitation is mild. Aortic Valve: The aortic valve is tricuspid. Aortic valve regurgitation is not visualized. Aortic valve sclerosis/calcification is present, without any evidence of aortic stenosis. Pulmonic Valve: The pulmonic valve was normal in structure. Pulmonic valve regurgitation is trivial. Aorta: The aortic root and ascending aorta are structurally normal, with no evidence of dilitation. Venous: The inferior vena cava is normal in size with greater than 50% respiratory variability, suggesting right atrial pressure of 3 mmHg. IAS/Shunts: No  atrial level shunt detected by color flow Doppler.  LEFT VENTRICLE PLAX 2D LVIDd:         5.10 cm LVIDs:  3.60 cm LV PW:         1.00 cm LV IVS:        1.10 cm LVOT diam:     1.90 cm LV SV:         52 LV SV Index:   27 LVOT Area:     2.84 cm  LV Volumes (MOD) LV vol d, MOD A2C: 171.0 ml LV vol s, MOD A2C: 70.4 ml LV SV MOD A2C:     100.6 ml RIGHT VENTRICLE             IVC RV Basal diam:  3.80 cm     IVC diam: 1.80 cm RV S prime:     16.10 cm/s TAPSE (M-mode): 1.9 cm LEFT ATRIUM             Index        RIGHT ATRIUM           Index LA diam:        3.90 cm 2.04 cm/m   RA Area:     18.30 cm LA Vol (A2C):   45.6 ml 23.82 ml/m  RA Volume:   50.20 ml  26.22 ml/m LA Vol (A4C):   48.6 ml 25.39 ml/m LA Biplane Vol: 48.6 ml 25.39 ml/m  AORTIC VALVE LVOT Vmax:   98.50 cm/s LVOT Vmean:  65.000 cm/s LVOT VTI:    0.182 m  AORTA Ao Root diam: 3.60 cm Ao Asc diam:  3.30 cm TRICUSPID VALVE TR Peak grad:   29.4 mmHg TR Mean grad:   20.0 mmHg TR Vmax:        271.00 cm/s TR Vmean:       210.0 cm/s  SHUNTS Systemic VTI:  0.18 m Systemic Diam: 1.90 cm Vina Gull MD Electronically signed by Vina Gull MD Signature Date/Time: 06/16/2024/4:35:56 PM    Final    VAS US  CAROTID Result Date: 06/16/2024 Carotid Arterial Duplex Study Patient Name:  Eric Cummings  Date of Exam:   06/16/2024 Medical Rec #: [id number]            Accession #:    [id number] Date of Birth: [birth year]             Patient Gender: M Patient Age:   65 years Exam Location:  [facility] Procedure:      VAS US  CAROTID Referring Phys: EARLE DE LA TORRE --------------------------------------------------------------------------------  Indications:       CVA. Risk Factors:      Hypertension, hyperlipidemia, Diabetes, no history of                    smoking. Other Factors:     Afib. Limitations        Today's exam was limited due to EXTREMELY high bifurcation,                    patient movement. Comparison Study:  No previous exams. MRA of the neck  on 06/16/2024 was WNL Performing Technologist: Ezzie Potters RVT, RDMS  Examination Guidelines: A complete evaluation includes B-mode imaging, spectral Doppler, color Doppler, and power Doppler as needed of all accessible portions of each vessel. Bilateral testing is considered an integral part of a complete examination. Limited examinations for reoccurring indications may be performed as noted.  Right Carotid Findings: +----------+--------+--------+--------+------------------+-------------------+           PSV cm/sEDV cm/sStenosisPlaque DescriptionComments            +----------+--------+--------+--------+------------------+-------------------+ CCA  Prox  81      6                                 intimal thickening  +----------+--------+--------+--------+------------------+-------------------+ CCA Distal101     7                                 intimal thickening  +----------+--------+--------+--------+------------------+-------------------+ ICA Prox  50      11                                                    +----------+--------+--------+--------+------------------+-------------------+ ICA Mid   55      10                                                    +----------+--------+--------+--------+------------------+-------------------+ ICA Distal                                          unable to visualize +----------+--------+--------+--------+------------------+-------------------+ ECA       79      0                                                     +----------+--------+--------+--------+------------------+-------------------+ +----------+--------+-------+----------------+-------------------+           PSV cm/sEDV cmsDescribe        Arm Pressure (mmHG) +----------+--------+-------+----------------+-------------------+ [id number]            Multiphasic, WNL                    +----------+--------+-------+----------------+-------------------+  +---------+--------+--+--------+-+---------+ VertebralPSV cm/s49EDV cm/s9Antegrade +---------+--------+--+--------+-+---------+  Left Carotid Findings: +----------+--------+--------+--------+------------------+-------------------+           PSV cm/sEDV cm/sStenosisPlaque DescriptionComments            +----------+--------+--------+--------+------------------+-------------------+ CCA Prox  93      8                                 intimal thickening  +----------+--------+--------+--------+------------------+-------------------+ CCA Distal66      6                                                     +----------+--------+--------+--------+------------------+-------------------+ ICA Prox  29      7                                                     +----------+--------+--------+--------+------------------+-------------------+ ICA Distal  unable to visualize +----------+--------+--------+--------+------------------+-------------------+ ECA       130     7                                                     +----------+--------+--------+--------+------------------+-------------------+ +----------+--------+--------+----------------+-------------------+           PSV cm/sEDV cm/sDescribe        Arm Pressure (mmHG) +----------+--------+--------+----------------+-------------------+ [id number]             Multiphasic, WNL                    +----------+--------+--------+----------------+-------------------+ +---------+--------+--+--------+-+---------+ VertebralPSV cm/s34EDV cm/s9Antegrade +---------+--------+--+--------+-+---------+   Summary: Right Carotid: The extracranial vessels were near-normal with only minimal wall                thickening or plaque in areas visualized Left Carotid: The extracranial vessels were near-normal with only minimal wall               thickening or plaque in areas visualized  Vertebrals:  Bilateral vertebral arteries demonstrate antegrade flow. Subclavians: Normal flow hemodynamics were seen in bilateral subclavian              arteries. *See table(s) above for measurements and observations.     Preliminary    MR ANGIO NECK W WO CONTRAST Result Date: 06/16/2024 EXAM: MRA Neck without and with contrast 06/16/2024 04:27:52 AM TECHNIQUE: Multiplanar multisequence MRA of the neck was performed without and with the administration of 7 mL of gadobutrol  (GADAVIST ) 1 MMOL/ML injection. 2D and 3D reformatted images are provided for review. Stenosis of the internal carotid arteries is measured using NASCET criteria. COMPARISON: None available CLINICAL HISTORY: stroke FINDINGS: CAROTID ARTERIES: The cervical segments of the internal carotid arteries are moderately tortuous bilaterally, but normal in caliber. No dissection. No hemodynamically significant stenosis by NASCET criteria. VERTEBRAL ARTERIES: The left vertebral artery is dominant and the right vertebral artery is relatively hypoplastic. No dissection. No significant stenosis. IMPRESSION: 1. No hemodynamically significant stenosis or dissection of the neck arteries. 2. Moderate tortuosity of the cervical segments of the internal carotid arteries bilaterally, otherwise normal in caliber. 3. Dominant left vertebral artery with relatively hypoplastic right vertebral artery. Electronically signed by: Evalene Coho MD 06/16/2024 04:50 AM EST RP Workstation: [id number]   MR ANGIO HEAD WO CONTRAST Result Date: 06/16/2024 EXAM: MR Angiography Head without intravenous Contrast. 06/16/2024 04:27:37 AM TECHNIQUE: Magnetic resonance angiography images of the head without intravenous contrast. Multiplanar 2D and 3D reformatted images are provided for review. COMPARISON: None provided. CLINICAL HISTORY: Stroke, follow up. FINDINGS: ANTERIOR CIRCULATION: Mild stenosis of the ascending petrous segment of the right internal carotid artery. No  significant stenosis of the left internal carotid artery. The right A1 segment is hypoplastic. No significant stenosis of the left anterior cerebral artery. No significant stenosis of the middle cerebral arteries. No aneurysm. POSTERIOR CIRCULATION: The left vertebral artery is dominant. No significant stenosis of the right vertebral artery. No significant stenosis of the posterior cerebral arteries. No significant stenosis of the basilar artery. No aneurysm. IMPRESSION: 1. Mild stenosis of the ascending petrous segment of the right internal carotid artery. 2. Hypoplastic right A1 segment. 3. Dominant left vertebral artery. Electronically signed by: Evalene Coho MD 06/16/2024 04:47 AM EST RP Workstation: [id number]   MR BRAIN WO CONTRAST Result Date: 06/15/2024 CLINICAL DATA:  Initial evaluation for acute neuro deficit, stroke suspected. EXAM: MRI HEAD WITHOUT CONTRAST TECHNIQUE: Multiplanar, multiecho pulse sequences of the brain and surrounding structures were obtained without intravenous contrast. COMPARISON:  Comparison made with prior MRI from 06/14/2024. FINDINGS: Brain: Cerebral volume within normal limits. Patchy T2/FLAIR hyperintensity involving the periventricular deep white matter, consistent with chronic small vessel ischemic disease, mild for age. Approximate 4.4 cm area of restricted diffusion seen involving the parafalcine left frontoparietal region, consistent with an acute left ACA distribution infarct (series 5, image 68). No associated hemorrhage or mass effect. No other evidence for acute or subacute ischemia. Gray-white matter differentiation otherwise maintained. No other areas of chronic cortical infarction. No acute or chronic intracranial blood products. No mass lesion, midline shift or mass effect no hydrocephalus or extra-axial fluid collection. Pituitary gland and suprasellar region within normal limits. Vascular: Major intracranial vascular flow voids are maintained. Hypoplastic  right vertebral artery noted. Skull and upper cervical spine: Craniocervical junction within normal limits. Bone marrow signal intensity overall within normal limits for no scalp soft tissue abnormality. Sinuses/Orbits: Globes and orbital soft tissues within normal limits. Scattered retention cysts noted about the maxillary sinuses. Paranasal sinuses are otherwise largely clear. No significant mastoid effusion. Other: None. IMPRESSION: 1. 4.4 cm acute left ACA distribution infarct. No associated hemorrhage or mass effect. 2. Underlying mild chronic microvascular ischemic disease. Electronically Signed   By: Morene Hoard M.D.   On: 06/15/2024 23:25   CT CERVICAL SPINE WO CONTRAST Result Date: 06/15/2024 CLINICAL DATA:  [facility] EXAM: CT CERVICAL SPINE WITHOUT CONTRAST TECHNIQUE: Multidetector CT imaging of the cervical spine was performed without intravenous contrast. Multiplanar CT image reconstructions were also generated. RADIATION DOSE REDUCTION: This exam was performed according to the departmental dose-optimization program which includes automated exposure control, adjustment of the mA and/or kV according to patient size and/or use of iterative reconstruction technique. COMPARISON:  None Available. FINDINGS: Alignment: Alignment is anatomic. Skull base and vertebrae: No acute fracture. No primary bone lesion or focal pathologic process. Soft tissues and spinal canal: No prevertebral fluid or swelling. No visible canal hematoma. Disc levels: Mild multilevel spondylosis most pronounced at C3-4. Mild right predominant facet hypertrophy from C3-4 through C6-7. No significant bony encroachment upon the central canal or neural foramina. Upper chest: Airway is patent.  Lung apices are clear. Other: Reconstructed images demonstrate no additional findings. IMPRESSION: 1. No acute cervical spine fracture. 2. Mild multilevel cervical degenerative changes. Electronically Signed   By: Ozell Daring M.D.   On:  06/15/2024 16:18   VAS US  LOWER EXTREMITY VENOUS (DVT) (7a-5p) Result Date: 06/15/2024  Lower Venous DVT Study Patient Name:  Eric Cummings  Date of Exam:   06/15/2024 Medical Rec #: [id number]            Accession #:    [id number] Date of Birth: 04/29/1966             Patient Gender: M Patient Age:   22 years Exam Location:  [facility] Procedure:      VAS US  LOWER EXTREMITY VENOUS (DVT) Referring Phys: ADAM CURATOLO --------------------------------------------------------------------------------  Indications: Pain.  Limitations: Suboptimal positioning. Comparison Study: No previous exams Performing Technologist: Jody Hill RVT, RDMS  Examination Guidelines: A complete evaluation includes B-mode imaging, spectral Doppler, color Doppler, and power Doppler as needed of all accessible portions of each vessel. Bilateral testing is considered an integral part of a complete examination. Limited examinations for reoccurring indications may be performed as noted. The reflux portion  of the exam is performed with the patient in reverse Trendelenburg.  +---------+---------------+---------+-----------+----------+--------------+ RIGHT    CompressibilityPhasicitySpontaneityPropertiesThrombus Aging +---------+---------------+---------+-----------+----------+--------------+ CFV      Full           Yes      Yes                                 +---------+---------------+---------+-----------+----------+--------------+ SFJ      Full                                                        +---------+---------------+---------+-----------+----------+--------------+ FV Prox  Full           Yes      Yes                                 +---------+---------------+---------+-----------+----------+--------------+ FV Mid   Full           Yes      Yes                                 +---------+---------------+---------+-----------+----------+--------------+ FV DistalFull           Yes      Yes                                  +---------+---------------+---------+-----------+----------+--------------+ PFV      Full                                                        +---------+---------------+---------+-----------+----------+--------------+ POP      Full           Yes      Yes                                 +---------+---------------+---------+-----------+----------+--------------+ PTV      Full                                                        +---------+---------------+---------+-----------+----------+--------------+ PERO     Full                                                        +---------+---------------+---------+-----------+----------+--------------+   +----+---------------+---------+-----------+----------+--------------+ LEFTCompressibilityPhasicitySpontaneityPropertiesThrombus Aging +----+---------------+---------+-----------+----------+--------------+ CFV Full           Yes      Yes                                 +----+---------------+---------+-----------+----------+--------------+  Summary: RIGHT: - There is no evidence of deep vein thrombosis in the lower extremity.  - No cystic structure found in the popliteal fossa.  LEFT: - No evidence of common femoral vein obstruction.   *See table(s) above for measurements and observations. Electronically signed by Debby Robertson on 06/15/2024 at 2:12:55 PM.    Final      LOS: 2 days   Donalda Applebaum, MD  [facility]    To contact the attending provider between 7A-7P or the covering provider during after hours 7P-7A, please log into the web site [email or web address] and access using universal [place] password for that web site. If you do not have the password, please call the hospital operator.  06/17/2024, 9:26 AM    "

## 2024-06-17 NOTE — TOC Progression Note (Signed)
 Transition of Care [facility]) - Progression Note    Patient Details  Name: Eric Cummings MRN: [id number] Date of Birth: Jul 29, 1965  Transition [facility]) CM/SW Contact  Bridget Cordella Simmonds, LCSW Phone Number: 06/17/2024, 9:09 AM  Clinical Narrative:   Message from [facility]: no bed available until Monday.  MD informed.                      Expected Discharge Plan and Services                                               Social Drivers of Health (SDOH) Interventions SDOH Screenings   Food Insecurity: Patient Unable To Answer (06/16/2024)  Housing: Patient Unable To Answer (06/16/2024)  Transportation Needs: Patient Unable To Answer (06/16/2024)  Utilities: Patient Unable To Answer (06/16/2024)  Tobacco Use: Low Risk (04/17/2024)    Readmission Risk Interventions     No data to display

## 2024-06-17 NOTE — Plan of Care (Signed)
" °  Problem: Education: Goal: Ability to describe self-care measures that may prevent or decrease complications (Diabetes Survival Skills Education) will improve Outcome: Progressing Goal: Individualized Educational Video(s) Outcome: Progressing   Problem: Coping: Goal: Ability to adjust to condition or change in health will improve Outcome: Progressing   Problem: Fluid Volume: Goal: Ability to maintain a balanced intake and output will improve Outcome: Progressing   Problem: Health Behavior/Discharge Planning: Goal: Ability to identify and utilize available resources and services will improve Outcome: Progressing Goal: Ability to manage health-related needs will improve Outcome: Progressing   Problem: Nutritional: Goal: Maintenance of adequate nutrition will improve Outcome: Progressing Goal: Progress toward achieving an optimal weight will improve Outcome: Progressing   Problem: Skin Integrity: Goal: Risk for impaired skin integrity will decrease Outcome: Progressing   Problem: Tissue Perfusion: Goal: Adequacy of tissue perfusion will improve Outcome: Progressing   Problem: Ischemic Stroke/TIA Tissue Perfusion: Goal: Complications of ischemic stroke/TIA will be minimized Outcome: Progressing   Problem: Coping: Goal: Will verbalize positive feelings about self Outcome: Progressing Goal: Will identify appropriate support needs Outcome: Progressing   "

## 2024-06-18 DIAGNOSIS — I4891 Unspecified atrial fibrillation: Secondary | ICD-10-CM | POA: Diagnosis not present

## 2024-06-18 DIAGNOSIS — I639 Cerebral infarction, unspecified: Secondary | ICD-10-CM | POA: Diagnosis not present

## 2024-06-18 LAB — GLUCOSE, CAPILLARY
Glucose-Capillary: 137 mg/dL — ABNORMAL HIGH (ref 70–99)
Glucose-Capillary: 197 mg/dL — ABNORMAL HIGH (ref 70–99)
Glucose-Capillary: 219 mg/dL — ABNORMAL HIGH (ref 70–99)
Glucose-Capillary: 155 mg/dL — ABNORMAL HIGH (ref 70–99)

## 2024-06-18 NOTE — Plan of Care (Signed)

## 2024-06-18 NOTE — Plan of Care (Signed)
" °  Problem: Nutritional: Goal: Maintenance of adequate nutrition will improve Outcome: Progressing   Problem: Health Behavior/Discharge Planning: Goal: Goals will be collaboratively established with patient/family Outcome: Progressing   Problem: Clinical Measurements: Goal: Diagnostic test results will improve Outcome: Progressing   "

## 2024-06-18 NOTE — Progress Notes (Signed)
 "                        PROGRESS NOTE        PATIENT DETAILS Name: Eric Cummings Age: 59 y.o. Sex: male Date of Birth: [birth year] Admit Date: 06/14/2024 Admitting Physician Redia LOISE Cleaver, MD ERE:Mlddn, Norleen, MD  Brief Summary: Patient is a 59 y.o.  male with history of autism-DM-2-who presented with right-sided weakness-A-fib RVR-found to have acute CVA.  Significant events: 1/15>> admit to [facility].  Significant studies: 01/14>> CT head: No acute endocrine abnormality 01/14>> MRI brain: Motion degraded exam but no acute abnormalities noted 01/14>> x-ray pelvis: No fracture/dislocation 01/14>> x-ray right femur: No acute findings 01/14>> x-ray right tibia/fibula: No acute findings 01/14>> x-ray L-spine: No acute findings 01/15>> A1c: 8.7 01/15>> CT C-spine: No acute fracture 01/15>> MRI brain: 4.4 cm left ACA distribution infarct. 01/16>> MRA head: Mild stenosis of petrous segment of right ICA. 01/16>> MRA neck: No stenosis/LVO. 01/16>> B/L carotid Doppler: No stenosis 01/16>> echo: EF 55-60%. 01/16>> LDL: 49  Significant microbiology data: None  Procedures: None  Consults: Neurology Cardiology  Subjective: Unchanged-continues to have more right upper extremity weakness than right lower extremity.  Objective: Vitals: Blood pressure 129/82, pulse 71, temperature 98.3 F (36.8 C), resp. rate 19, height 5' 10 (1.778 m), weight 74 kg, SpO2 93%.   Exam: Appears comfortable-not in any distress-nonverbal at baseline Continues to have unchanged right-sided hemiparesis (more on the right upper extremity than lower extremity)  Pertinent Labs/Radiology:    Latest Ref Rng & Units 06/16/2024    3:22 AM 06/14/2024    2:02 PM 06/14/2024    2:00 PM  CBC  WBC 4.0 - 10.5 K/uL 10.1   7.2   Hemoglobin 13.0 - 17.0 g/dL 85.7  83.6  84.9   Hematocrit 39.0 - 52.0 % 43.6  48.0  46.9   Platelets 150 - 400 K/uL 254   246     Lab Results  Component Value Date   NA 133  (L) 06/16/2024   K 4.7 06/16/2024   CL 97 (L) 06/16/2024   CO2 22 06/16/2024      Assessment/Plan: Acute left ACA infarct Embolic etiology-in the setting of newly discovered A-fib Unchanged right-sided hemiparesis Workup as above Continue Eliquis  SNF planned-TOC following-tentatively on Monday.  PAF with RVR Sinus rhythm Cardiology following-has been switched from Cardizem  to beta-blocker On Eliquis .  Mechanical fall Likely secondary to CVA/right-sided weakness Numerous imaging studies negative for any significant injuries.  HTN BP stable On beta-blocker/Avapro .   DM-2 CBGs relatively stable Semglee  10 units daily+ SSI Continue Tradjenta /metformin /Jardiance  Follow.  Recent Labs    06/17/24 1613 06/17/24 2115 06/18/24 0720  GLUCAP 152* 146* 137*    GERD PPI  Mood disorder Zoloft   History of autism/nonverbal at baseline Supportive care  Code status:   Code Status: Full Code   DVT Prophylaxis: apixaban  (ELIQUIS ) tablet 5 mg     Family Communication:Brother at bedside   Disposition Plan: Status is: Inpatient Remains inpatient appropriate because: Severity of illness   Planned Discharge Destination:Skilled nursing facility   Diet: Diet Order             Diet Carb Modified Room service appropriate? Yes with Assist  Diet effective now                     Antimicrobial agents: Anti-infectives (From admission, onward)    None  MEDICATIONS: Scheduled Meds:   stroke: early stages of recovery book   Does not apply Once   apixaban   5 mg Oral BID   empagliflozin   10 mg Oral Daily   fenofibrate   160 mg Oral Daily   gabapentin   100 mg Oral QPC breakfast   gabapentin   300 mg Oral QHS   insulin  aspart  0-15 Units Subcutaneous TID WC   insulin  aspart  0-5 Units Subcutaneous QHS   insulin  glargine-yfgn  10 Units Subcutaneous Daily   irbesartan   150 mg Oral Daily   linagliptin   5 mg Oral Daily   metFORMIN   500 mg Oral BID WC    metoprolol  tartrate  25 mg Oral BID   pantoprazole   40 mg Oral Daily   sertraline   100 mg Oral Daily   Continuous Infusions:   PRN Meds:.acetaminophen  **OR** acetaminophen  (TYLENOL ) oral liquid 160 mg/5 mL **OR** acetaminophen , clobetasol  cream, senna-docusate   I have personally reviewed following labs and imaging studies  LABORATORY DATA: CBC: Recent Labs  Lab 06/14/24 1400 06/14/24 1402 06/16/24 0322  WBC 7.2  --  10.1  NEUTROABS 4.4  --  8.1*  HGB 15.0 16.3 14.2  HCT 46.9 48.0 43.6  MCV 88.2  --  86.3  PLT 246  --  254    Basic Metabolic Panel: Recent Labs  Lab 06/14/24 1400 06/14/24 1402 06/16/24 0031  NA 143 142 133*  K 5.3* 5.1 4.7  CL 103 103 97*  CO2 29  --  22  GLUCOSE 260* 251* 212*  BUN 34* 41* 31*  CREATININE 1.22 1.30* 1.22  CALCIUM 9.9  --  9.2    GFR: Estimated Creatinine Clearance: 68.1 mL/min (by C-G formula based on SCr of 1.22 mg/dL).  Liver Function Tests: Recent Labs  Lab 06/14/24 1400  AST 36  ALT 45*  ALKPHOS 113  BILITOT 0.5  PROT 7.7  ALBUMIN 4.5   No results for input(s): LIPASE, AMYLASE in the last 168 hours. No results for input(s): AMMONIA in the last 168 hours.  Coagulation Profile: Recent Labs  Lab 06/14/24 1400  INR 1.0    Cardiac Enzymes: No results for input(s): CKTOTAL, CKMB, CKMBINDEX, TROPONINI in the last 168 hours.  BNP (last 3 results) No results for input(s): PROBNP in the last 8760 hours.  Lipid Profile: Recent Labs    06/16/24 0031  CHOL 111  HDL 42  LDLCALC 49  TRIG 101  CHOLHDL 2.7    Thyroid  Function Tests: No results for input(s): TSH, T4TOTAL, FREET4, T3FREE, THYROIDAB in the last 72 hours.  Anemia Panel: No results for input(s): VITAMINB12, FOLATE, FERRITIN, TIBC, IRON, RETICCTPCT in the last 72 hours.  Urine analysis: No results found for: COLORURINE, APPEARANCEUR, LABSPEC, PHURINE, GLUCOSEU, HGBUR, BILIRUBINUR, KETONESUR,  PROTEINUR, UROBILINOGEN, NITRITE, LEUKOCYTESUR  Sepsis Labs: Lactic Acid, Venous No results found for: LATICACIDVEN  MICROBIOLOGY: No results found for this or any previous visit (from the past 240 hours).  RADIOLOGY STUDIES/RESULTS: ECHOCARDIOGRAM COMPLETE Result Date: 06/16/2024    ECHOCARDIOGRAM REPORT   Patient Name:   Eric Cummings Date of Exam: 06/16/2024 Medical Rec #:  [id number]           Height:       70.0 in Accession #:    [id number]          Weight:       163.1 lb Date of Birth:  [birth year]            BSA:  1.914 m Patient Age:    58 years            BP:           133/74 mmHg Patient Gender: M                   HR:           74 bpm. Exam Location:  Inpatient Procedure: 2D Echo, Cardiac Doppler and Color Doppler (Both Spectral and Color            Flow Doppler were utilized during procedure). Indications:    Atrial Fibrillation I48.91  History:        Patient has no prior history of Echocardiogram examinations.                 CVA, Arrythmias:Atrial Fibrillation; Risk Factors:Diabetes,                 Dyslipidemia and Hypertension.  Sonographer:    Koleen Popper RDCS Referring Phys: VIJAYA AKULA IMPRESSIONS  1. Left ventricular ejection fraction, by estimation, is 55 to 60%. The left ventricle has normal function. The left ventricle has no regional wall motion abnormalities. Left ventricular diastolic parameters were normal.  2. Right ventricular systolic function is normal. The right ventricular size is normal. There is normal pulmonary artery systolic pressure.  3. Trivial mitral valve regurgitation.  4. The aortic valve is tricuspid. Aortic valve regurgitation is not visualized. Aortic valve sclerosis/calcification is present, without any evidence of aortic stenosis.  5. The inferior vena cava is normal in size with greater than 50% respiratory variability, suggesting right atrial pressure of 3 mmHg. FINDINGS  Left Ventricle: Left ventricular ejection fraction, by  estimation, is 55 to 60%. The left ventricle has normal function. The left ventricle has no regional wall motion abnormalities. The left ventricular internal cavity size was normal in size. There is  no left ventricular hypertrophy. Left ventricular diastolic parameters were normal. Right Ventricle: The right ventricular size is normal. Right vetricular wall thickness was not assessed. Right ventricular systolic function is normal. There is normal pulmonary artery systolic pressure. The tricuspid regurgitant velocity is 2.71 m/s, and with an assumed right atrial pressure of 3 mmHg, the estimated right ventricular systolic pressure is 32.4 mmHg. Left Atrium: Left atrial size was normal in size. Right Atrium: Right atrial size was normal in size. Pericardium: There is no evidence of pericardial effusion. Mitral Valve: There is mild thickening of the mitral valve leaflet(s). Trivial mitral valve regurgitation. Tricuspid Valve: The tricuspid valve is normal in structure. Tricuspid valve regurgitation is mild. Aortic Valve: The aortic valve is tricuspid. Aortic valve regurgitation is not visualized. Aortic valve sclerosis/calcification is present, without any evidence of aortic stenosis. Pulmonic Valve: The pulmonic valve was normal in structure. Pulmonic valve regurgitation is trivial. Aorta: The aortic root and ascending aorta are structurally normal, with no evidence of dilitation. Venous: The inferior vena cava is normal in size with greater than 50% respiratory variability, suggesting right atrial pressure of 3 mmHg. IAS/Shunts: No atrial level shunt detected by color flow Doppler.  LEFT VENTRICLE PLAX 2D LVIDd:         5.10 cm LVIDs:         3.60 cm LV PW:         1.00 cm LV IVS:        1.10 cm LVOT diam:     1.90 cm LV SV:         52  LV SV Index:   27 LVOT Area:     2.84 cm  LV Volumes (MOD) LV vol d, MOD A2C: 171.0 ml LV vol s, MOD A2C: 70.4 ml LV SV MOD A2C:     100.6 ml RIGHT VENTRICLE             IVC RV Basal  diam:  3.80 cm     IVC diam: 1.80 cm RV S prime:     16.10 cm/s TAPSE (M-mode): 1.9 cm LEFT ATRIUM             Index        RIGHT ATRIUM           Index LA diam:        3.90 cm 2.04 cm/m   RA Area:     18.30 cm LA Vol (A2C):   45.6 ml 23.82 ml/m  RA Volume:   50.20 ml  26.22 ml/m LA Vol (A4C):   48.6 ml 25.39 ml/m LA Biplane Vol: 48.6 ml 25.39 ml/m  AORTIC VALVE LVOT Vmax:   98.50 cm/s LVOT Vmean:  65.000 cm/s LVOT VTI:    0.182 m  AORTA Ao Root diam: 3.60 cm Ao Asc diam:  3.30 cm TRICUSPID VALVE TR Peak grad:   29.4 mmHg TR Mean grad:   20.0 mmHg TR Vmax:        271.00 cm/s TR Vmean:       210.0 cm/s  SHUNTS Systemic VTI:  0.18 m Systemic Diam: 1.90 cm Vina Gull MD Electronically signed by Vina Gull MD Signature Date/Time: 06/16/2024/4:35:56 PM    Final    VAS US  CAROTID Result Date: 06/16/2024 Carotid Arterial Duplex Study Patient Name:  Eric Cummings  Date of Exam:   06/16/2024 Medical Rec #: [id number]            Accession #:    [id number] Date of Birth: [birth year]             Patient Gender: M Patient Age:   51 years Exam Location:  [facility] Procedure:      VAS US  CAROTID Referring Phys: EARLE DE LA TORRE --------------------------------------------------------------------------------  Indications:       CVA. Risk Factors:      Hypertension, hyperlipidemia, Diabetes, no history of                    smoking. Other Factors:     Afib. Limitations        Today's exam was limited due to EXTREMELY high bifurcation,                    patient movement. Comparison Study:  No previous exams. MRA of the neck on 06/16/2024 was WNL Performing Technologist: Ezzie Potters RVT, RDMS  Examination Guidelines: A complete evaluation includes B-mode imaging, spectral Doppler, color Doppler, and power Doppler as needed of all accessible portions of each vessel. Bilateral testing is considered an integral part of a complete examination. Limited examinations for reoccurring indications may be performed as noted.   Right Carotid Findings: +----------+--------+--------+--------+------------------+-------------------+           PSV cm/sEDV cm/sStenosisPlaque DescriptionComments            +----------+--------+--------+--------+------------------+-------------------+ CCA Prox  81      6                                 intimal thickening  +----------+--------+--------+--------+------------------+-------------------+  CCA Distal101     7                                 intimal thickening  +----------+--------+--------+--------+------------------+-------------------+ ICA Prox  50      11                                                    +----------+--------+--------+--------+------------------+-------------------+ ICA Mid   55      10                                                    +----------+--------+--------+--------+------------------+-------------------+ ICA Distal                                          unable to visualize +----------+--------+--------+--------+------------------+-------------------+ ECA       79      0                                                     +----------+--------+--------+--------+------------------+-------------------+ +----------+--------+-------+----------------+-------------------+           PSV cm/sEDV cmsDescribe        Arm Pressure (mmHG) +----------+--------+-------+----------------+-------------------+ [id number]            Multiphasic, WNL                    +----------+--------+-------+----------------+-------------------+ +---------+--------+--+--------+-+---------+ VertebralPSV cm/s49EDV cm/s9Antegrade +---------+--------+--+--------+-+---------+  Left Carotid Findings: +----------+--------+--------+--------+------------------+-------------------+           PSV cm/sEDV cm/sStenosisPlaque DescriptionComments            +----------+--------+--------+--------+------------------+-------------------+ CCA  Prox  93      8                                 intimal thickening  +----------+--------+--------+--------+------------------+-------------------+ CCA Distal66      6                                                     +----------+--------+--------+--------+------------------+-------------------+ ICA Prox  29      7                                                     +----------+--------+--------+--------+------------------+-------------------+ ICA Distal                                          unable to visualize +----------+--------+--------+--------+------------------+-------------------+ ECA  130     7                                                     +----------+--------+--------+--------+------------------+-------------------+ +----------+--------+--------+----------------+-------------------+           PSV cm/sEDV cm/sDescribe        Arm Pressure (mmHG) +----------+--------+--------+----------------+-------------------+ [id number]             Multiphasic, WNL                    +----------+--------+--------+----------------+-------------------+ +---------+--------+--+--------+-+---------+ VertebralPSV cm/s34EDV cm/s9Antegrade +---------+--------+--+--------+-+---------+   Summary: Right Carotid: The extracranial vessels were near-normal with only minimal wall                thickening or plaque in areas visualized Left Carotid: The extracranial vessels were near-normal with only minimal wall               thickening or plaque in areas visualized Vertebrals:  Bilateral vertebral arteries demonstrate antegrade flow. Subclavians: Normal flow hemodynamics were seen in bilateral subclavian              arteries. *See table(s) above for measurements and observations.     Preliminary      LOS: 3 days   Donalda Applebaum, MD  [facility]    To contact the attending provider between 7A-7P or the covering provider during after hours 7P-7A,  please log into the web site [email or web address] and access using universal [place] password for that web site. If you do not have the password, please call the hospital operator.  06/18/2024, 10:19 AM    "

## 2024-06-19 DIAGNOSIS — E1165 Type 2 diabetes mellitus with hyperglycemia: Secondary | ICD-10-CM | POA: Diagnosis not present

## 2024-06-19 DIAGNOSIS — I639 Cerebral infarction, unspecified: Secondary | ICD-10-CM | POA: Diagnosis not present

## 2024-06-19 DIAGNOSIS — Z794 Long term (current) use of insulin: Secondary | ICD-10-CM | POA: Diagnosis not present

## 2024-06-19 DIAGNOSIS — I4891 Unspecified atrial fibrillation: Secondary | ICD-10-CM | POA: Diagnosis not present

## 2024-06-19 LAB — GLUCOSE, CAPILLARY
Glucose-Capillary: 128 mg/dL — ABNORMAL HIGH (ref 70–99)
Glucose-Capillary: 175 mg/dL — ABNORMAL HIGH (ref 70–99)

## 2024-06-19 MED ORDER — SENNOSIDES-DOCUSATE SODIUM 8.6-50 MG PO TABS: 1.0000 | ORAL_TABLET | Freq: Every evening | ORAL | Status: AC | PRN

## 2024-06-19 MED ORDER — INSULIN GLARGINE 100 UNIT/ML ~~LOC~~ SOLN: 10.0000 [IU] | Freq: Every day | SUBCUTANEOUS | Status: DC

## 2024-06-19 MED ORDER — APIXABAN 5 MG PO TABS: 5.0000 mg | ORAL_TABLET | Freq: Two times a day (BID) | ORAL | Status: AC

## 2024-06-19 MED ORDER — NOVOLOG FLEXPEN 100 UNIT/ML ~~LOC~~ SOPN: PEN_INJECTOR | SUBCUTANEOUS | Status: AC

## 2024-06-19 MED ORDER — LORATADINE 10 MG PO TABS
10.0000 mg | ORAL_TABLET | Freq: Every day | ORAL | Status: DC
  Administered 2024-06-19: 10 mg via ORAL
  Filled 2024-06-19: qty 1

## 2024-06-19 MED ORDER — METOPROLOL TARTRATE 25 MG PO TABS: 25.0000 mg | ORAL_TABLET | Freq: Two times a day (BID) | ORAL | Status: AC

## 2024-06-19 NOTE — TOC Transition Note (Signed)
 Transition of Care [facility]) - Discharge Note   Patient Details  Name: Eric Cummings MRN: [id number] Date of Birth: 01/04/1966  [facility]) CM/SW Contact:  Inocente GORMAN Kindle, LCSW Phone Number: 06/19/2024, 11:00 AM   Clinical Narrative:    Patient will DC to: [name] Anticipated DC date: 06/19/24 Family notified: Brother at bedside Transport by: [facility]   Per MD patient ready for DC to [name]. RN to call report prior to discharge [phone] ex. [phone], room 218). RN, patient, patient's family, and facility notified of DC. Discharge Summary and FL2 sent to facility. DC packet on chart. Ambulance transport requested for patient.   CSW will sign off for now as social work intervention is no longer needed. Please consult us  again if new needs arise.     Final next level of care: Skilled Nursing Facility Barriers to Discharge: No Barriers Identified   Patient Goals and CMS Choice Patient states their goals for this hospitalization and ongoing recovery are:: Rehab CMS Medicare.gov Compare Post Acute Care list provided to:: Patient Represenative (must comment) Choice offered to / list presented to : Sibling [place] ownership interest in [facility].provided to:: Sibling    Discharge Placement   Existing PASRR number confirmed : 06/19/24          Patient chooses bed at: [facility] Patient to be transferred to facility by: [facility] Name of family member notified: Brother Patient and family notified of of transfer: 06/19/24  Discharge Plan and Services Additional resources added to the After Visit Summary for Autism/IDD In-house Referral: Clinical Social Work   Post Acute Care Choice: Skilled Nursing Facility                               Social Drivers of Health (SDOH) Interventions SDOH Screenings   Food Insecurity: Patient Unable To Answer (06/16/2024)  Housing: Patient Unable To Answer (06/16/2024)  Transportation  Needs: Patient Unable To Answer (06/16/2024)  Utilities: Patient Unable To Answer (06/16/2024)  Tobacco Use: Low Risk (04/17/2024)     Readmission Risk Interventions     No data to display

## 2024-06-19 NOTE — Progress Notes (Signed)
 PT Cancellation Note  Patient Details Name: HALLEY KINCER MRN: [id number] DOB: 09/05/1965   Cancelled Treatment:    Reason Eval/Treat Not Completed: Patient preparing for transfer to SNF, will defer treatment.  Mylinh Cragg, PT, DPT Acute Rehabilitation Services  Personal: Secure Chat Rehab Office: [phone]  Darice LITTIE Almas 06/19/2024, 11:59 AM

## 2024-06-19 NOTE — Plan of Care (Signed)

## 2024-06-19 NOTE — TOC Initial Note (Signed)
 Transition of Care [facility]) - Initial/Assessment Note    Patient Details  Name: Eric Cummings MRN: [id number] Date of Birth: Oct 22, 1965  Transition [facility]) CM/SW Contact:    Eric GORMAN Kindle, LCSW Phone Number: 06/19/2024, 9:56 AM  Clinical Narrative:                 CSW confirmed [name] has a bed today. CSW spoke with patient's brother and he reported preference for [facility] for transport.     Expected Discharge Plan: Skilled Nursing Facility Barriers to Discharge: No Barriers Identified   Patient Goals and CMS Choice Patient states their goals for this hospitalization and ongoing recovery are:: Rehab CMS Medicare.gov Compare Post Acute Care list provided to:: Patient Represenative (must comment) Choice offered to / list presented to : Sibling Margaret ownership interest in [facility] Suite.provided to:: Sibling    Expected Discharge Plan and Services In-house Referral: Clinical Social Work   Post Acute Care Choice: Skilled Nursing Facility Living arrangements for the past 2 months: Single Family Home Expected Discharge Date: 06/19/24                                    Prior Living Arrangements/Services Living arrangements for the past 2 months: Single Family Home Lives with:: Siblings Patient language and need for interpreter reviewed:: Yes Do you feel safe going back to the place where you live?: Yes      Need for Family Participation in Patient Care: Yes (Comment) Care giver support system in place?: Yes (comment)   Criminal Activity/Legal Involvement Pertinent to Current Situation/Hospitalization: No - Comment as needed  Activities of Daily Living   ADL Screening (condition at time of admission) Independently performs ADLs?: No Does the patient have a NEW difficulty with bathing/dressing/toileting/self-feeding that is expected to last >3 days?: Yes (Initiates electronic notice to provider for possible OT consult) Does the patient have a NEW  difficulty with getting in/out of bed, walking, or climbing stairs that is expected to last >3 days?: Yes (Initiates electronic notice to provider for possible PT consult) Does the patient have a NEW difficulty with communication that is expected to last >3 days?: No  Permission Sought/Granted Permission sought to share information with : Facility Medical Sales Representative, Family Supports Permission granted to share information with : No  Share Information with NAME: Eric Cummings [phone] / [phone]  Permission granted to share info w AGENCY: SNFs        Emotional Assessment Appearance:: Appears stated age Attitude/Demeanor/Rapport: Unable to Assess Affect (typically observed): Unable to Assess Orientation: : Oriented to Self Alcohol / Substance Use: Not Applicable Psych Involvement: No (comment)  Admission diagnosis:  Weakness of right leg [R29.898] Atrial fibrillation with RVR (HCC) [I48.91] Atrial fibrillation, new onset (HCC) [I48.91] Acute CVA (cerebrovascular accident) [facility]) [I63.9] Patient Active Problem List   Diagnosis Date Noted   Atrial fibrillation with RVR (HCC) 06/15/2024   Acute CVA (cerebrovascular accident) (HCC) 06/15/2024   Urinary incontinence 10/07/2021   Nocturia 05/15/2021   Encounter for screening for other disorder 04/04/2018   Diabetic peripheral neuropathy associated with type 2 diabetes mellitus (HCC) 01/08/2016   Diabetic renal disease (HCC) 01/08/2016   Adult failure to thrive syndrome 08/16/2015   Constipation 05/17/2015   Polyneuropathy 05/17/2015   Tinea unguium 05/17/2015   Cardiac arrhythmia 01/25/2015   Long term (current) use of insulin  (HCC) 01/25/2015   Abnormal gait 09/14/2014  Type 1 diabetes mellitus without complications (HCC) 09/14/2014   Cough 11/30/2013   Syncope and collapse 08/21/2013   Impacted cerumen 07/29/2012   Underimmunization status 03/01/2012   Otalgia 11/27/2011   Gastro-esophageal reflux disease  without esophagitis 06/20/2010   Allergic rhinitis 02/28/2010   Dental caries 06/07/2009   Hyperglycemia due to type 2 diabetes mellitus (HCC) 06/07/2009   Autistic disorder 05/28/2009   Dermatitis 05/28/2009   Essential hypertension 05/28/2009   Hyperlipidemia 05/28/2009   Obesity 05/28/2009   Profound intellectual disabilities 05/28/2009   PCP:  Onita Rush, MD Pharmacy:   [id number] - [place], [place] - [address] AT [facility] OF [address] & [address] [facility] [address] Phone: [phone] Fax: [phone]     Social Drivers of Health (SDOH) Social History: SDOH Screenings   Food Insecurity: Patient Unable To Answer (06/16/2024)  Housing: Patient Unable To Answer (06/16/2024)  Transportation Needs: Patient Unable To Answer (06/16/2024)  Utilities: Patient Unable To Answer (06/16/2024)  Tobacco Use: Low Risk (04/17/2024)   SDOH Interventions:     Readmission Risk Interventions     No data to display

## 2024-06-19 NOTE — Plan of Care (Signed)
  Problem: Education: Goal: Ability to describe self-care measures that may prevent or decrease complications (Diabetes Survival Skills Education) will improve Outcome: Completed/Met Goal: Individualized Educational Video(s) Outcome: Completed/Met   Problem: Coping: Goal: Ability to adjust to condition or change in health will improve Outcome: Completed/Met   Problem: Fluid Volume: Goal: Ability to maintain a balanced intake and output will improve Outcome: Completed/Met   Problem: Health Behavior/Discharge Planning: Goal: Ability to identify and utilize available resources and services will improve Outcome: Completed/Met Goal: Ability to manage health-related needs will improve Outcome: Completed/Met   Problem: Metabolic: Goal: Ability to maintain appropriate glucose levels will improve Outcome: Completed/Met   Problem: Nutritional: Goal: Maintenance of adequate nutrition will improve Outcome: Completed/Met Goal: Progress toward achieving an optimal weight will improve Outcome: Completed/Met   Problem: Skin Integrity: Goal: Risk for impaired skin integrity will decrease Outcome: Completed/Met   Problem: Tissue Perfusion: Goal: Adequacy of tissue perfusion will improve Outcome: Completed/Met   Problem: Education: Goal: Knowledge of disease or condition will improve Outcome: Completed/Met Goal: Knowledge of secondary prevention will improve (MUST DOCUMENT ALL) Outcome: Completed/Met Goal: Knowledge of patient specific risk factors will improve (DELETE if not current risk factor) Outcome: Completed/Met   Problem: Ischemic Stroke/TIA Tissue Perfusion: Goal: Complications of ischemic stroke/TIA will be minimized Outcome: Completed/Met   Problem: Coping: Goal: Will verbalize positive feelings about self Outcome: Completed/Met Goal: Will identify appropriate support needs Outcome: Completed/Met   Problem: Health Behavior/Discharge Planning: Goal: Ability to manage  health-related needs will improve Outcome: Completed/Met Goal: Goals will be collaboratively established with patient/family Outcome: Completed/Met   Problem: Self-Care: Goal: Ability to participate in self-care as condition permits will improve Outcome: Completed/Met Goal: Verbalization of feelings and concerns over difficulty with self-care will improve Outcome: Completed/Met Goal: Ability to communicate needs accurately will improve Outcome: Completed/Met   Problem: Nutrition: Goal: Risk of aspiration will decrease Outcome: Completed/Met Goal: Dietary intake will improve Outcome: Completed/Met   Problem: Education: Goal: Knowledge of General Education information will improve Description: Including pain rating scale, medication(s)/side effects and non-pharmacologic comfort measures Outcome: Completed/Met   Problem: Health Behavior/Discharge Planning: Goal: Ability to manage health-related needs will improve Outcome: Completed/Met   Problem: Clinical Measurements: Goal: Ability to maintain clinical measurements within normal limits will improve Outcome: Completed/Met Goal: Will remain free from infection Outcome: Completed/Met Goal: Diagnostic test results will improve Outcome: Completed/Met Goal: Respiratory complications will improve Outcome: Completed/Met Goal: Cardiovascular complication will be avoided Outcome: Completed/Met   Problem: Activity: Goal: Risk for activity intolerance will decrease Outcome: Completed/Met   Problem: Nutrition: Goal: Adequate nutrition will be maintained Outcome: Completed/Met   Problem: Coping: Goal: Level of anxiety will decrease Outcome: Completed/Met   Problem: Elimination: Goal: Will not experience complications related to bowel motility Outcome: Completed/Met Goal: Will not experience complications related to urinary retention Outcome: Completed/Met   Problem: Pain Managment: Goal: General experience of comfort will  improve and/or be controlled Outcome: Completed/Met   Problem: Safety: Goal: Ability to remain free from injury will improve Outcome: Completed/Met   Problem: Skin Integrity: Goal: Risk for impaired skin integrity will decrease Outcome: Completed/Met

## 2024-06-19 NOTE — Discharge Summary (Signed)
 "  PATIENT DETAILS Name: Eric Cummings Age: 59 y.o. Sex: male Date of Birth: April 12, 1966 MRN: [id number]. Admitting Physician: Eric LOISE Cleaver, Cummings ERE:Mlddn, Eric Cummings  Admit Date: 06/14/2024 Discharge date: 06/19/2024  Recommendations for Outpatient Follow-up:  Follow up with PCP in 1-2 weeks Please obtain CMP/CBC in one week Please ensure follow-up with stroke clinic  Admitted From:  Home  Disposition: Skilled nursing facility   Discharge Condition: good  CODE STATUS:   Code Status: Full Code   Diet recommendation:  Diet Order             Diet Carb Modified           Diet - low sodium heart healthy           Diet Carb Modified Room service appropriate? Yes with Assist  Diet effective now                    Brief Summary: Patient is a 59 y.o.  male with history of autism-DM-2-who presented with right-sided weakness-A-fib RVR-found to have acute CVA.   Significant events: 1/15>> admit to [facility].   Significant studies: 01/14>> CT head: No acute endocrine abnormality 01/14>> MRI brain: Motion degraded exam but no acute abnormalities noted 01/14>> x-ray pelvis: No fracture/dislocation 01/14>> x-ray right femur: No acute findings 01/14>> x-ray right tibia/fibula: No acute findings 01/14>> x-ray L-spine: No acute findings 01/15>> A1c: 8.7 01/15>> CT C-spine: No acute fracture 01/15>> MRI brain: 4.4 cm left ACA distribution infarct. 01/16>> MRA head: Mild stenosis of petrous segment of right ICA. 01/16>> MRA neck: No stenosis/LVO. 01/16>> B/L carotid Doppler: No stenosis 01/16>> echo: EF 55-60%. 01/16>> LDL: 49   Significant microbiology data: None   Procedures: None   Consults: Neurology Cardiology  Brief Hospital Course: Acute left ACA infarct Embolic etiology-in the setting of newly discovered A-fib Unchanged right-sided hemiparesis Workup as above Continue Eliquis  SNF planned on discharge   PAF with RVR Sinus rhythm Cardiology  following-has been switched from Cardizem  to beta-blocker On Eliquis .   Mechanical fall Likely secondary to CVA/right-sided weakness Numerous imaging studies negative for any significant injuries.   HTN BP stable On beta-blocker/Avapro .    DM-2 CBGs relatively stable Semglee  10 units daily+ SSI Continue Januvia/metformin /Jardiance  Follow.  GERD PPI   Mood disorder Zoloft    History of autism/nonverbal at baseline Supportive care   Discharge Diagnoses:  Principal Problem:   Atrial fibrillation with RVR (HCC) Active Problems:   Acute CVA (cerebrovascular accident) [facility])   Discharge Instructions:  Activity:  As tolerated with Full fall precautions use walker/cane & assistance as needed  Discharge Instructions     Ambulatory referral to Neurology   Complete by: As directed    An appointment is requested in approximately: 4 weeks   Call Cummings for:  difficulty breathing, headache or visual disturbances   Complete by: As directed    Call Cummings for:  persistant dizziness or light-headedness   Complete by: As directed    Diet - low sodium heart healthy   Complete by: As directed    Diet Carb Modified   Complete by: As directed    Discharge instructions   Complete by: As directed    Follow with Primary Cummings  Eric Eric Cummings in 1-2 weeks  Please get a complete blood count and chemistry panel checked by your Primary Cummings at your next visit, and again as instructed by your Primary Cummings.  Get Medicines reviewed and adjusted: Please take all your medications  with you for your next visit with your Primary Cummings  Laboratory/radiological data: Please request your Primary Cummings to go over all hospital tests and procedure/radiological results at the follow up, please ask your Primary Cummings to get all Hospital records sent to his/her office.  In some cases, they will be blood work, cultures and biopsy results pending at the time of your discharge. Please request that your primary care M.D.  follows up on these results.  Also Note the following: If you experience worsening of your admission symptoms, develop shortness of breath, life threatening emergency, suicidal or homicidal thoughts you must seek medical attention immediately by calling 911 or calling your Cummings immediately  if symptoms less severe.  You must read complete instructions/literature along with all the possible adverse reactions/side effects for all the Medicines you take and that have been prescribed to you. Take any new Medicines after you have completely understood and accpet all the possible adverse reactions/side effects.   Do not drive when taking Pain medications or sleeping medications (Benzodaizepines)  Do not take more than prescribed Pain, Sleep and Anxiety Medications. It is not advisable to combine anxiety,sleep and pain medications without talking with your primary care practitioner  Special Instructions: If you have smoked or chewed Tobacco  in the last 2 yrs please stop smoking, stop any regular Alcohol  and or any Recreational drug use.  Wear Seat belts while driving.  Please note: You were cared for by a hospitalist during your hospital stay. Once you are discharged, your primary care physician will handle any further medical issues. Please note that NO REFILLS for any discharge medications will be authorized once you are discharged, as it is imperative that you return to your primary care physician (or establish a relationship with a primary care physician if you do not have one) for your post hospital discharge needs so that they can reassess your need for medications and monitor your lab values.   Increase activity slowly   Complete by: As directed       Allergies as of 06/19/2024       Reactions   Ace Inhibitors    Other reaction(s): cough Other reaction(s): cough   Iodine    Other reaction(s): Unknown Other reaction(s): Unknown   Shellfish Allergy     Other reaction(s): Unknown    Shellfish Protein-containing Drug Products    Other reaction(s): Unknown Other reaction(s): Unknown   Simvastatin    Other reaction(s): intolerance to Other reaction(s): intolerance to        Medication List     STOP taking these medications    amLODipine  10 MG tablet Commonly known as: NORVASC    insulin  lispro 100 UNIT/ML injection Commonly known as: HUMALOG       TAKE these medications    apixaban  5 MG Tabs tablet Commonly known as: ELIQUIS  Take 1 tablet (5 mg total) by mouth 2 (two) times daily.   cetirizine  10 MG tablet Commonly known as: ZyrTEC  Allergy  Take 1 tablet (10 mg total) by mouth 2 (two) times daily as needed (itching, allergies). What changed: when to take this   clobetasol  cream 0.05 % Commonly known as: TEMOVATE  Apply 1 Application topically 2 (two) times daily as needed (breakout).   EPINEPHrine  0.3 mg/0.3 mL Soaj injection Commonly known as: EPI-PEN Inject 0.3 mg into the muscle as needed for anaphylaxis.   fenofibrate  160 MG tablet Take 160 mg by mouth daily.   Fluocinolone  Acetonide Body 0.01 % Oil Commonly known as: Derma-Smoothe /FS Body  Apply once a day on the whole body. Once rash/itching clears up only use up to twice per week.   gabapentin  100 MG capsule Commonly known as: NEURONTIN  TK 1 C PO IN MORNING AND 3 CAPSULE HS   insulin  glargine 100 UNIT/ML injection Commonly known as: LANTUS  Inject 0.1 mLs (10 Units total) into the skin at bedtime. What changed: how much to take   Januvia 100 MG tablet Generic drug: sitaGLIPtin Take 1 tablet by mouth daily.   Jardiance  10 MG Tabs tablet Generic drug: empagliflozin  Take 1 tablet by mouth daily.   metFORMIN  500 MG 24 hr tablet Commonly known as: GLUCOPHAGE -XR TK 2 TS PO QAM AND 1 T QPM   metoprolol  tartrate 25 MG tablet Commonly known as: LOPRESSOR  Take 1 tablet (25 mg total) by mouth 2 (two) times daily.   MiraLax 17 g packet Generic drug: polyethylene glycol Take 17 g by  mouth 2 (two) times a week.   NONFORMULARY OR COMPOUNDED ITEM [facility] compound:  Peripheral Neuropathy cream - Bupivacaine 1%, Doxepin 3%, Gabapentin  6%, Pentoxifylline 3%, Topiramate 1%, dispense 120 grams, apply 1-2 grams to affected area 3-4 times daily, +2refills.   NovoLOG  FlexPen 100 UNIT/ML FlexPen Generic drug: insulin  aspart 0-9 Units, Subcutaneous, 3 times daily with meals CBG < 70: Implement Hypoglycemia measures CBG 70 - 120: 0 units CBG 121 - 150: 1 unit CBG 151 - 200: 2 units CBG 201 - 250: 3 units CBG 251 - 300: 5 units CBG 301 - 350: 7 units CBG 351 - 400: 9 units CBG > 400: call Cummings   olmesartan 20 MG tablet Commonly known as: BENICAR Take 20 mg by mouth daily.   omeprazole 20 MG capsule Commonly known as: PRILOSEC Take 20 mg by mouth daily.   senna-docusate 8.6-50 MG tablet Commonly known as: Senokot-S Take 1 tablet by mouth at bedtime as needed for moderate constipation.   sertraline  100 MG tablet Commonly known as: ZOLOFT  Take 100 mg by mouth daily.   triazolam 0.125 MG tablet Commonly known as: HALCION TAKE 3 TABLETS BY MOUTH PRIOR TO DENTAL PROCEDURE AS DIRECTED BY DENTAL STAFF        Contact information for follow-up providers     Eric Rush, Cummings. Schedule an appointment as soon as possible for a visit in 1 week(s).   Specialty: Internal Medicine Contact information: [address] [place] [address] [phone]         [facility] Follow up.   Specialty: Neurology Why: Hospital follow up, Office will call with date/time, If you dont hear from them,please give them a call Contact information: [address] [place]  [address] [phone]             Contact information for after-discharge care     Destination     [facility]   Service: Skilled Nursing Contact information: [address] [place]   [address] [phone]                    Allergies[1]   Other Procedures/Studies: VAS US  CAROTID Result Date: 06/19/2024 Carotid Arterial Duplex Study Patient Name:  Eric Cummings  Date of Exam:   06/16/2024 Medical Rec #: [id number]            Accession #:    [id number] Date of Birth: 01-13-1966             Patient Gender: M Patient Age:   36  years Exam Location:  [facility] Procedure:      VAS US  CAROTID Referring Phys: EARLE DE LA TORRE --------------------------------------------------------------------------------  Indications:       CVA. Risk Factors:      Hypertension, hyperlipidemia, Diabetes, no history of                    smoking. Other Factors:     Afib. Limitations        Today's exam was limited due to EXTREMELY high bifurcation,                    patient movement. Comparison Study:  No previous exams. MRA of the neck on 06/16/2024 was WNL Performing Technologist: Ezzie Potters RVT, RDMS  Examination Guidelines: A complete evaluation includes B-mode imaging, spectral Doppler, color Doppler, and power Doppler as needed of all accessible portions of each vessel. Bilateral testing is considered an integral part of a complete examination. Limited examinations for reoccurring indications may be performed as noted.  Right Carotid Findings: +----------+--------+--------+--------+------------------+-------------------+           PSV cm/sEDV cm/sStenosisPlaque DescriptionComments            +----------+--------+--------+--------+------------------+-------------------+ CCA Prox  81      6                                 intimal thickening  +----------+--------+--------+--------+------------------+-------------------+ CCA Distal101     7                                 intimal thickening  +----------+--------+--------+--------+------------------+-------------------+ ICA Prox  50      11                                                     +----------+--------+--------+--------+------------------+-------------------+ ICA Mid   55      10                                                    +----------+--------+--------+--------+------------------+-------------------+ ICA Distal                                          unable to visualize +----------+--------+--------+--------+------------------+-------------------+ ECA       79      0                                                     +----------+--------+--------+--------+------------------+-------------------+ +----------+--------+-------+----------------+-------------------+           PSV cm/sEDV cmsDescribe        Arm Pressure (mmHG) +----------+--------+-------+----------------+-------------------+ [id number]            Multiphasic, WNL                    +----------+--------+-------+----------------+-------------------+ +---------+--------+--+--------+-+---------+ VertebralPSV cm/s49EDV cm/s9Antegrade +---------+--------+--+--------+-+---------+  Left Carotid Findings: +----------+--------+--------+--------+------------------+-------------------+           PSV cm/sEDV cm/sStenosisPlaque DescriptionComments            +----------+--------+--------+--------+------------------+-------------------+ CCA Prox  93      8                                 intimal thickening  +----------+--------+--------+--------+------------------+-------------------+ CCA Distal66      6                                                     +----------+--------+--------+--------+------------------+-------------------+ ICA Prox  29      7                                                     +----------+--------+--------+--------+------------------+-------------------+ ICA Distal                                          unable to visualize +----------+--------+--------+--------+------------------+-------------------+ ECA       130     7                                                      +----------+--------+--------+--------+------------------+-------------------+ +----------+--------+--------+----------------+-------------------+           PSV cm/sEDV cm/sDescribe        Arm Pressure (mmHG) +----------+--------+--------+----------------+-------------------+ [id number]             Multiphasic, WNL                    +----------+--------+--------+----------------+-------------------+ +---------+--------+--+--------+-+---------+ VertebralPSV cm/s34EDV cm/s9Antegrade +---------+--------+--+--------+-+---------+   Summary: Right Carotid: The extracranial vessels were near-normal with only minimal wall                thickening or plaque in areas visualized Left Carotid: The extracranial vessels were near-normal with only minimal wall               thickening or plaque in areas visualized Vertebrals:  Bilateral vertebral arteries demonstrate antegrade flow. Subclavians: Normal flow hemodynamics were seen in bilateral subclavian              arteries. *See table(s) above for measurements and observations.  Electronically signed by Eather Popp Cummings on 06/19/2024 at 8:22:08 AM.    Final    ECHOCARDIOGRAM COMPLETE Result Date: 06/16/2024    ECHOCARDIOGRAM REPORT   Patient Name:   Eric Cummings Date of Exam: 06/16/2024 Medical Rec #:  [id number]           Height:       70.0 in Accession #:    [id number]          Weight:       163.1 lb Date of Birth:  06-15-1965            BSA:          1.914  m Patient Age:    58 years            BP:           133/74 mmHg Patient Gender: M                   HR:           74 bpm. Exam Location:  Inpatient Procedure: 2D Echo, Cardiac Doppler and Color Doppler (Both Spectral and Color            Flow Doppler were utilized during procedure). Indications:    Atrial Fibrillation I48.91  History:        Patient has no prior history of Echocardiogram examinations.                 CVA, Arrythmias:Atrial  Fibrillation; Risk Factors:Diabetes,                 Dyslipidemia and Hypertension.  Sonographer:    Koleen Popper RDCS Referring Phys: VIJAYA AKULA IMPRESSIONS  1. Left ventricular ejection fraction, by estimation, is 55 to 60%. The left ventricle has normal function. The left ventricle has no regional wall motion abnormalities. Left ventricular diastolic parameters were normal.  2. Right ventricular systolic function is normal. The right ventricular size is normal. There is normal pulmonary artery systolic pressure.  3. Trivial mitral valve regurgitation.  4. The aortic valve is tricuspid. Aortic valve regurgitation is not visualized. Aortic valve sclerosis/calcification is present, without any evidence of aortic stenosis.  5. The inferior vena cava is normal in size with greater than 50% respiratory variability, suggesting right atrial pressure of 3 mmHg. FINDINGS  Left Ventricle: Left ventricular ejection fraction, by estimation, is 55 to 60%. The left ventricle has normal function. The left ventricle has no regional wall motion abnormalities. The left ventricular internal cavity size was normal in size. There is  no left ventricular hypertrophy. Left ventricular diastolic parameters were normal. Right Ventricle: The right ventricular size is normal. Right vetricular wall thickness was not assessed. Right ventricular systolic function is normal. There is normal pulmonary artery systolic pressure. The tricuspid regurgitant velocity is 2.71 m/s, and with an assumed right atrial pressure of 3 mmHg, the estimated right ventricular systolic pressure is 32.4 mmHg. Left Atrium: Left atrial size was normal in size. Right Atrium: Right atrial size was normal in size. Pericardium: There is no evidence of pericardial effusion. Mitral Valve: There is mild thickening of the mitral valve leaflet(s). Trivial mitral valve regurgitation. Tricuspid Valve: The tricuspid valve is normal in structure. Tricuspid valve regurgitation  is mild. Aortic Valve: The aortic valve is tricuspid. Aortic valve regurgitation is not visualized. Aortic valve sclerosis/calcification is present, without any evidence of aortic stenosis. Pulmonic Valve: The pulmonic valve was normal in structure. Pulmonic valve regurgitation is trivial. Aorta: The aortic root and ascending aorta are structurally normal, with no evidence of dilitation. Venous: The inferior vena cava is normal in size with greater than 50% respiratory variability, suggesting right atrial pressure of 3 mmHg. IAS/Shunts: No atrial level shunt detected by color flow Doppler.  LEFT VENTRICLE PLAX 2D LVIDd:         5.10 cm LVIDs:         3.60 cm LV PW:         1.00 cm LV IVS:        1.10 cm LVOT diam:     1.90 cm LV SV:         52  LV SV Index:   27 LVOT Area:     2.84 cm  LV Volumes (MOD) LV vol d, MOD A2C: 171.0 ml LV vol s, MOD A2C: 70.4 ml LV SV MOD A2C:     100.6 ml RIGHT VENTRICLE             IVC RV Basal diam:  3.80 cm     IVC diam: 1.80 cm RV S prime:     16.10 cm/s TAPSE (M-mode): 1.9 cm LEFT ATRIUM             Index        RIGHT ATRIUM           Index LA diam:        3.90 cm 2.04 cm/m   RA Area:     18.30 cm LA Vol (A2C):   45.6 ml 23.82 ml/m  RA Volume:   50.20 ml  26.22 ml/m LA Vol (A4C):   48.6 ml 25.39 ml/m LA Biplane Vol: 48.6 ml 25.39 ml/m  AORTIC VALVE LVOT Vmax:   98.50 cm/s LVOT Vmean:  65.000 cm/s LVOT VTI:    0.182 m  AORTA Ao Root diam: 3.60 cm Ao Asc diam:  3.30 cm TRICUSPID VALVE TR Peak grad:   29.4 mmHg TR Mean grad:   20.0 mmHg TR Vmax:        271.00 cm/s TR Vmean:       210.0 cm/s  SHUNTS Systemic VTI:  0.18 m Systemic Diam: 1.90 cm Vina Gull Cummings Electronically signed by Vina Gull Cummings Signature Date/Time: 06/16/2024/4:35:56 PM    Final    MR ANGIO NECK W WO CONTRAST Result Date: 06/16/2024 EXAM: MRA Neck without and with contrast 06/16/2024 04:27:52 AM TECHNIQUE: Multiplanar multisequence MRA of the neck was performed without and with the administration of 7 mL of  gadobutrol  (GADAVIST ) 1 MMOL/ML injection. 2D and 3D reformatted images are provided for review. Stenosis of the internal carotid arteries is measured using NASCET criteria. COMPARISON: None available CLINICAL HISTORY: stroke FINDINGS: CAROTID ARTERIES: The cervical segments of the internal carotid arteries are moderately tortuous bilaterally, but normal in caliber. No dissection. No hemodynamically significant stenosis by NASCET criteria. VERTEBRAL ARTERIES: The left vertebral artery is dominant and the right vertebral artery is relatively hypoplastic. No dissection. No significant stenosis. IMPRESSION: 1. No hemodynamically significant stenosis or dissection of the neck arteries. 2. Moderate tortuosity of the cervical segments of the internal carotid arteries bilaterally, otherwise normal in caliber. 3. Dominant left vertebral artery with relatively hypoplastic right vertebral artery. Electronically signed by: Evalene Coho Cummings 06/16/2024 04:50 AM EST RP Workstation: [id number]   MR ANGIO HEAD WO CONTRAST Result Date: 06/16/2024 EXAM: MR Angiography Head without intravenous Contrast. 06/16/2024 04:27:37 AM TECHNIQUE: Magnetic resonance angiography images of the head without intravenous contrast. Multiplanar 2D and 3D reformatted images are provided for review. COMPARISON: None provided. CLINICAL HISTORY: Stroke, follow up. FINDINGS: ANTERIOR CIRCULATION: Mild stenosis of the ascending petrous segment of the right internal carotid artery. No significant stenosis of the left internal carotid artery. The right A1 segment is hypoplastic. No significant stenosis of the left anterior cerebral artery. No significant stenosis of the middle cerebral arteries. No aneurysm. POSTERIOR CIRCULATION: The left vertebral artery is dominant. No significant stenosis of the right vertebral artery. No significant stenosis of the posterior cerebral arteries. No significant stenosis of the basilar artery. No aneurysm. IMPRESSION:  1. Mild stenosis of the ascending petrous segment of the right internal carotid artery. 2. Hypoplastic  right A1 segment. 3. Dominant left vertebral artery. Electronically signed by: Evalene Coho Cummings 06/16/2024 04:47 AM EST RP Workstation: [id number]   MR BRAIN WO CONTRAST Result Date: 06/15/2024 CLINICAL DATA:  Initial evaluation for acute neuro deficit, stroke suspected. EXAM: MRI HEAD WITHOUT CONTRAST TECHNIQUE: Multiplanar, multiecho pulse sequences of the brain and surrounding structures were obtained without intravenous contrast. COMPARISON:  Comparison made with prior MRI from 06/14/2024. FINDINGS: Brain: Cerebral volume within normal limits. Patchy T2/FLAIR hyperintensity involving the periventricular deep white matter, consistent with chronic small vessel ischemic disease, mild for age. Approximate 4.4 cm area of restricted diffusion seen involving the parafalcine left frontoparietal region, consistent with an acute left ACA distribution infarct (series 5, image 68). No associated hemorrhage or mass effect. No other evidence for acute or subacute ischemia. Gray-white matter differentiation otherwise maintained. No other areas of chronic cortical infarction. No acute or chronic intracranial blood products. No mass lesion, midline shift or mass effect no hydrocephalus or extra-axial fluid collection. Pituitary gland and suprasellar region within normal limits. Vascular: Major intracranial vascular flow voids are maintained. Hypoplastic right vertebral artery noted. Skull and upper cervical spine: Craniocervical junction within normal limits. Bone marrow signal intensity overall within normal limits for no scalp soft tissue abnormality. Sinuses/Orbits: Globes and orbital soft tissues within normal limits. Scattered retention cysts noted about the maxillary sinuses. Paranasal sinuses are otherwise largely clear. No significant mastoid effusion. Other: None. IMPRESSION: 1. 4.4 cm acute left ACA distribution  infarct. No associated hemorrhage or mass effect. 2. Underlying mild chronic microvascular ischemic disease. Electronically Signed   By: Morene Hoard M.D.   On: 06/15/2024 23:25   CT CERVICAL SPINE WO CONTRAST Result Date: 06/15/2024 CLINICAL DATA:  [facility] EXAM: CT CERVICAL SPINE WITHOUT CONTRAST TECHNIQUE: Multidetector CT imaging of the cervical spine was performed without intravenous contrast. Multiplanar CT image reconstructions were also generated. RADIATION DOSE REDUCTION: This exam was performed according to the departmental dose-optimization program which includes automated exposure control, adjustment of the mA and/or kV according to patient size and/or use of iterative reconstruction technique. COMPARISON:  None Available. FINDINGS: Alignment: Alignment is anatomic. Skull base and vertebrae: No acute fracture. No primary bone lesion or focal pathologic process. Soft tissues and spinal canal: No prevertebral fluid or swelling. No visible canal hematoma. Disc levels: Mild multilevel spondylosis most pronounced at C3-4. Mild right predominant facet hypertrophy from C3-4 through C6-7. No significant bony encroachment upon the central canal or neural foramina. Upper chest: Airway is patent.  Lung apices are clear. Other: Reconstructed images demonstrate no additional findings. IMPRESSION: 1. No acute cervical spine fracture. 2. Mild multilevel cervical degenerative changes. Electronically Signed   By: Ozell Daring M.D.   On: 06/15/2024 16:18   VAS US  LOWER EXTREMITY VENOUS (DVT) (7a-5p) Result Date: 06/15/2024  Lower Venous DVT Study Patient Name:  Eric Cummings  Date of Exam:   06/15/2024 Medical Rec #: [id number]            Accession #:    [id number] Date of Birth: 26-Mar-1966             Patient Gender: M Patient Age:   24 years Exam Location:  [facility] Procedure:      VAS US  LOWER EXTREMITY VENOUS (DVT) Referring Phys: ADAM CURATOLO  --------------------------------------------------------------------------------  Indications: Pain.  Limitations: Suboptimal positioning. Comparison Study: No previous exams Performing Technologist: Jody Hill RVT, RDMS  Examination Guidelines: A complete evaluation includes B-mode imaging, spectral Doppler, color Doppler, and power Doppler  as needed of all accessible portions of each vessel. Bilateral testing is considered an integral part of a complete examination. Limited examinations for reoccurring indications may be performed as noted. The reflux portion of the exam is performed with the patient in reverse Trendelenburg.  +---------+---------------+---------+-----------+----------+--------------+ RIGHT    CompressibilityPhasicitySpontaneityPropertiesThrombus Aging +---------+---------------+---------+-----------+----------+--------------+ CFV      Full           Yes      Yes                                 +---------+---------------+---------+-----------+----------+--------------+ SFJ      Full                                                        +---------+---------------+---------+-----------+----------+--------------+ FV Prox  Full           Yes      Yes                                 +---------+---------------+---------+-----------+----------+--------------+ FV Mid   Full           Yes      Yes                                 +---------+---------------+---------+-----------+----------+--------------+ FV DistalFull           Yes      Yes                                 +---------+---------------+---------+-----------+----------+--------------+ PFV      Full                                                        +---------+---------------+---------+-----------+----------+--------------+ POP      Full           Yes      Yes                                 +---------+---------------+---------+-----------+----------+--------------+ PTV      Full                                                         +---------+---------------+---------+-----------+----------+--------------+ PERO     Full                                                        +---------+---------------+---------+-----------+----------+--------------+   +----+---------------+---------+-----------+----------+--------------+ LEFTCompressibilityPhasicitySpontaneityPropertiesThrombus Aging +----+---------------+---------+-----------+----------+--------------+ CFV Full           Yes  Yes                                 +----+---------------+---------+-----------+----------+--------------+     Summary: RIGHT: - There is no evidence of deep vein thrombosis in the lower extremity.  - No cystic structure found in the popliteal fossa.  LEFT: - No evidence of common femoral vein obstruction.   *See table(s) above for measurements and observations. Electronically signed by Debby Robertson on 06/15/2024 at 2:12:55 PM.    Final    DG Lumbar Spine Complete Result Date: 06/14/2024 EXAM: 4 VIEW(S) XRAY OF THE LUMBAR SPINE 06/14/2024 06:25:00 PM COMPARISON: None available. CLINICAL HISTORY: PAIN PAIN PAIN FINDINGS: LUMBAR SPINE: BONES: Vertebral body heights are maintained. Alignment is normal. DISCS AND DEGENERATIVE CHANGES: Moderate facet arthrosis L3 - S1. SOFT TISSUES: No acute abnormality. IMPRESSION: 1. 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 Electronically signed by: Dorethia Molt Cummings 06/14/2024 06:29 PM EST RP Workstation: [id number]   DG Femur Portable Min 2 Views Right Result Date: 06/14/2024 EXAM: 2 VIEW(S) XRAY OF THE RIGHT FEMUR 06/14/2024 04:52:00 PM COMPARISON: None available. CLINICAL HISTORY: right hip? right hip? right hip? FINDINGS: BONES AND JOINTS: No acute fracture. No malalignment. SOFT TISSUES: Mild vascular calcifications. IMPRESSION: 1. No acute findings. Electronically signed by: Morgane Naveau Cummings 06/14/2024 05:03 PM EST RP Workstation: [id number]   DG  Tibia/Fibula Right Result Date: 06/14/2024 CLINICAL DATA:  Possible right lower leg abnormality. No known injury. EXAM: RIGHT TIBIA AND FIBULA - 2 VIEW COMPARISON:  None Available. FINDINGS: There is no evidence of fracture or other focal bone lesions. Soft tissues are unremarkable. IMPRESSION: Negative. Electronically Signed   By: Toribio Agreste M.D.   On: 06/14/2024 17:01   DG Pelvis Portable Result Date: 06/14/2024 CLINICAL DATA:  Right hip pain. EXAM: PORTABLE PELVIS 1-2 VIEWS COMPARISON:  None Available. FINDINGS: No acute fracture or dislocation on this single image. There is heterogeneous density over the right femoral head. The bones are well mineralized. No significant arthritic changes. The soft tissues are unremarkable. IMPRESSION: No acute fracture or dislocation. Electronically Signed   By: Vanetta Chou M.D.   On: 06/14/2024 16:58   EEG adult Result Date: 06/14/2024 Shelton Arlin KIDD, Cummings     06/14/2024  4:39 PM Patient Name: MESHULEM ONORATO MRN: [id number] Epilepsy Attending: Arlin KIDD Shelton Referring Physician/Provider: Waddell Karna LABOR, NP Date: 06/15/2023 Duration: 22.56 mins Patient history: 59 y.o. 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 EEG to evaluate for seizure Level of alertness: Awake AEDs during EEG study: Ativan  Technical aspects: This EEG study was done with scalp electrodes positioned according to the 10-20 International system of electrode placement. Electrical activity was reviewed with band pass filter of 1-70Hz , sensitivity of 7 uV/mm, display speed of 36mm/sec with a 60Hz  notched filter applied as appropriate. EEG data were recorded continuously and digitally stored.  Video monitoring was available and reviewed as appropriate. Description: The posterior dominant rhythm consists of 10 Hz activity of moderate voltage  (25-35 uV) seen predominantly in posterior head regions, symmetric and reactive to eye opening and eye closing. There is an excessive amount of 15 to 18 Hz beta activity distributed symmetrically and diffusely. Hyperventilation and photic stimulation were not performed.   ABNORMALITY - Excessive beta, generalized IMPRESSION: This study  is within normal limits. The excessive beta activity seen in the background is most likely due to the effect of benzodiazepine and is a benign EEG pattern. No seizures or epileptiform discharges were seen throughout the recording. A normal interictal EEG does not exclude the diagnosis of epilepsy. Arlin MALVA Krebs   MR BRAIN WO CONTRAST Result Date: 06/14/2024 HISTORY: stroke EXAM: MR BRAIN WITHOUT CONTRAST TECHNIQUE: Multiplanar, multiecho pulse sequences of the brain and surrounding structures were obtained without intravenous contrast. Axial and coronal DWI, axial FLAIR, and axial GRE sequences were obtained. COMPARISON:  Same date CT head FINDINGS: The study is incomplete as the patient could not tolerate completing the exam. Axial and coronal DWI, motion degraded axial FLAIR, and axial GRE sequences were obtained. There is no evidence of acute infarct on the axial DWI sequence. There is no definite evidence of acute intracranial hemorrhage. Parenchymal volume is within normal limits. The ventricles are normal in size. Mild FLAIR signal abnormality in the periventricular white matter is nonspecific but may reflect mild underlying chronic small vessel ischemic change. There is no definite mass lesion. There is no mass effect or midline shift. IMPRESSION: 1. Incomplete study with axial and coronal DWI, motion degraded axial FLAIR, and axial GRE sequences obtained. 2.  No evidence of acute intracranial pathology on the provided images. Electronically signed by: Maude Harry Cummings 06/14/2024 03:27 PM EST RP Workstation: [id number]   CT HEAD CODE STROKE WO CONTRAST Result Date:  06/14/2024 EXAM: CT HEAD WITHOUT CONTRAST 06/14/2024 02:06:59 PM TECHNIQUE: CT of the head was performed without the administration of intravenous contrast. Automated exposure control, iterative reconstruction, and/or weight based adjustment of the mA/kV was utilized to reduce the radiation dose to as low as reasonably achievable. COMPARISON: None available. CLINICAL HISTORY: The patient presents with an acute neurological deficit, and a stroke is suspected. FINDINGS: BRAIN AND VENTRICLES: No acute hemorrhage. No evidence of acute infarct. No hydrocephalus. No extra-axial collection. No mass effect or midline shift. [place] Stroke Program Early CT (ASPECT) score: Ganglionic (caudate, IC, lentiform nucleus, insula, M1-M3): 7 Supraganglionic (M4-M6): 3 Total: 10 ORBITS: No acute abnormality. SINUSES: Polyps versus mucous retention cysts in the maxillary sinuses. SOFT TISSUES AND SKULL: No acute soft tissue abnormality. No skull fracture. IMPRESSION: 1. No acute intracranial abnormality. 2. ASPECT score is 10. 3. Findings messaged to Dr. Rosemarie via the [facility] messaging system at 2:24 PM on 06/14/24. Electronically signed by: Donnice Mania Cummings 06/14/2024 02:24 PM EST RP Workstation: [id number]     TODAY-DAY OF DISCHARGE:  Subjective:   Eric Cummings today has no headache,no chest abdominal pain,no new weakness tingling or numbness, feels much better wants to go home today.   Objective:   Blood pressure 115/72, pulse 71, temperature 98 F (36.7 C), temperature source Oral, resp. rate 19, height 5' 10 (1.778 m), weight 74 kg, SpO2 93%.  Intake/Output Summary (Last 24 hours) at 06/19/2024 0942 Last data filed at 06/19/2024 0307 Gross per 24 hour  Intake 0 ml  Output 1600 ml  Net -1600 ml   Filed Weights   06/14/24 1400 06/14/24 1431  Weight: 73.4 kg 74 kg    Exam: Awake Alert, Oriented *3, No new F.N deficits, Normal affect [place].AT,PERRAL Supple Neck,No JVD, No cervical lymphadenopathy  appriciated.  Symmetrical Chest wall movement, Good air movement bilaterally, CTAB RRR,No Gallops,Rubs or new Murmurs, No Parasternal Heave +ve B.Sounds, Abd Soft, Non tender, No organomegaly appriciated, No rebound -guarding or rigidity. No Cyanosis, Clubbing or edema, No new Rash or  bruise   PERTINENT RADIOLOGIC STUDIES: No results found.   PERTINENT LAB RESULTS: CBC: No results for input(s): WBC, HGB, HCT, PLT in the last 72 hours. CMET CMP     Component Value Date/Time   NA 133 (L) 06/16/2024 0031   NA 136 03/20/2024 1526   K 4.7 06/16/2024 0031   CL 97 (L) 06/16/2024 0031   CO2 22 06/16/2024 0031   GLUCOSE 212 (H) 06/16/2024 0031   BUN 31 (H) 06/16/2024 0031   BUN 36 (H) 03/20/2024 1526   CREATININE 1.22 06/16/2024 0031   CALCIUM 9.2 06/16/2024 0031   PROT 7.7 06/14/2024 1400   PROT 7.6 03/20/2024 1526   ALBUMIN 4.5 06/14/2024 1400   ALBUMIN 4.7 03/20/2024 1526   AST 36 06/14/2024 1400   ALT 45 (H) 06/14/2024 1400   ALKPHOS 113 06/14/2024 1400   BILITOT 0.5 06/14/2024 1400   BILITOT 0.5 03/20/2024 1526   EGFR 64 03/20/2024 1526   GFRNONAA >60 06/16/2024 0031    GFR Estimated Creatinine Clearance: 68.1 mL/min (by C-G formula based on SCr of 1.22 mg/dL). No results for input(s): LIPASE, AMYLASE in the last 72 hours. No results for input(s): CKTOTAL, CKMB, CKMBINDEX, TROPONINI in the last 72 hours. Invalid input(s): POCBNP No results for input(s): DDIMER in the last 72 hours. No results for input(s): HGBA1C in the last 72 hours. No results for input(s): CHOL, HDL, LDLCALC, TRIG, CHOLHDL, LDLDIRECT in the last 72 hours. No results for input(s): TSH, T4TOTAL, T3FREE, THYROIDAB in the last 72 hours.  Invalid input(s): FREET3 No results for input(s): VITAMINB12, FOLATE, FERRITIN, TIBC, IRON, RETICCTPCT in the last 72 hours. Coags: No results for input(s): INR in the last 72 hours.  Invalid input(s):  PT Microbiology: No results found for this or any previous visit (from the past 240 hours).  FURTHER DISCHARGE INSTRUCTIONS:  Get Medicines reviewed and adjusted: Please take all your medications with you for your next visit with your Primary Cummings  Laboratory/radiological data: Please request your Primary Cummings to go over all hospital tests and procedure/radiological results at the follow up, please ask your Primary Cummings to get all Hospital records sent to his/her office.  In some cases, they will be blood work, cultures and biopsy results pending at the time of your discharge. Please request that your primary care M.D. goes through all the records of your hospital data and follows up on these results.  Also Note the following: If you experience worsening of your admission symptoms, develop shortness of breath, life threatening emergency, suicidal or homicidal thoughts you must seek medical attention immediately by calling 911 or calling your Cummings immediately  if symptoms less severe.  You must read complete instructions/literature along with all the possible adverse reactions/side effects for all the Medicines you take and that have been prescribed to you. Take any new Medicines after you have completely understood and accpet all the possible adverse reactions/side effects.   Do not drive when taking Pain medications or sleeping medications (Benzodaizepines)  Do not take more than prescribed Pain, Sleep and Anxiety Medications. It is not advisable to combine anxiety,sleep and pain medications without talking with your primary care practitioner  Special Instructions: If you have smoked or chewed Tobacco  in the last 2 yrs please stop smoking, stop any regular Alcohol  and or any Recreational drug use.  Wear Seat belts while driving.  Please note: You were cared for by a hospitalist during your hospital stay. Once you are discharged, your primary care  physician will handle any further medical  issues. Please note that NO REFILLS for any discharge medications will be authorized once you are discharged, as it is imperative that you return to your primary care physician (or establish a relationship with a primary care physician if you do not have one) for your post hospital discharge needs so that they can reassess your need for medications and monitor your lab values.  Total Time spent coordinating discharge including counseling, education and face to face time equals greater than 30 minutes.  Signed: Donalda Applebaum 06/19/2024 9:42 AM      [1]  Allergies Allergen Reactions   Ace Inhibitors     Other reaction(s): cough Other reaction(s): cough   Iodine     Other reaction(s): Unknown Other reaction(s): Unknown   Shellfish Allergy      Other reaction(s): Unknown   Shellfish Protein-Containing Drug Products     Other reaction(s): Unknown Other reaction(s): Unknown   Simvastatin     Other reaction(s): intolerance to Other reaction(s): intolerance to   "

## 2024-06-19 NOTE — Plan of Care (Signed)
" °  Problem: Education: Goal: Ability to describe self-care measures that may prevent or decrease complications (Diabetes Survival Skills Education) will improve 06/19/2024 0731 by Elnor Zachary CROME, RN Outcome: Progressing 06/19/2024 0731 by Elnor Zachary CROME, RN Outcome: Progressing Goal: Individualized Educational Video(s) 06/19/2024 0731 by Elnor Zachary CROME, RN Outcome: Progressing 06/19/2024 0731 by Elnor Zachary CROME, RN Outcome: Progressing   Problem: Health Behavior/Discharge Planning: Goal: Ability to identify and utilize available resources and services will improve 06/19/2024 0731 by Elnor Zachary CROME, RN Outcome: Progressing 06/19/2024 0731 by Elnor Zachary CROME, RN Outcome: Progressing Goal: Ability to manage health-related needs will improve 06/19/2024 0731 by Elnor Zachary CROME, RN Outcome: Progressing 06/19/2024 0731 by Elnor Zachary CROME, RN Outcome: Progressing   Problem: Skin Integrity: Goal: Risk for impaired skin integrity will decrease 06/19/2024 0731 by Elnor Zachary CROME, RN Outcome: Progressing 06/19/2024 0731 by Elnor Zachary CROME, RN Outcome: Progressing   Problem: Ischemic Stroke/TIA Tissue Perfusion: Goal: Complications of ischemic stroke/TIA will be minimized 06/19/2024 0731 by Elnor Zachary CROME, RN Outcome: Progressing 06/19/2024 0731 by Elnor Zachary CROME, RN Outcome: Progressing   "

## 2024-06-20 NOTE — Care Management Important Message (Signed)
 Important Message  Patient Details  Name: Eric Cummings MRN: [id number] Date of Birth: December 26, 1965   Important Message Given:  Yes - Medicare IM   Patient left prior to IM delivery will mail a copy to the patient home address.   Griffon Herberg 06/20/2024, 8:35 AM

## 2024-06-24 ENCOUNTER — Encounter (HOSPITAL_COMMUNITY): Payer: Self-pay

## 2024-06-24 ENCOUNTER — Emergency Department (HOSPITAL_COMMUNITY)
Admission: EM | Admit: 2024-06-24 | Discharge: 2024-06-24 | Disposition: A | Discharge status: Skilled Nursing Facility | Attending: Emergency Medicine | Admitting: Emergency Medicine

## 2024-06-24 ENCOUNTER — Emergency Department (HOSPITAL_COMMUNITY)

## 2024-06-24 ENCOUNTER — Other Ambulatory Visit: Payer: Self-pay

## 2024-06-24 DIAGNOSIS — S0081XA Abrasion of other part of head, initial encounter: Secondary | ICD-10-CM | POA: Diagnosis present

## 2024-06-24 DIAGNOSIS — W19XXXA Unspecified fall, initial encounter: Secondary | ICD-10-CM | POA: Diagnosis not present

## 2024-06-24 DIAGNOSIS — S0083XA Contusion of other part of head, initial encounter: Secondary | ICD-10-CM | POA: Insufficient documentation

## 2024-06-24 NOTE — ED Provider Notes (Signed)
 " [facility] AT [facility] Provider Note   CSN: [id number] Arrival date & time: 06/24/24  9165     Patient presents with: No chief complaint on file.   Eric Cummings is a 59 y.o. male.   The history is provided by the EMS personnel and medical records. The history is limited by the condition of the patient and a developmental delay. No language interpreter was used.  Fall This is a new problem. The current episode started less than 1 hour ago. The problem has not changed since onset.Pertinent negatives include no chest pain, no abdominal pain, no headaches and no shortness of breath. Nothing aggravates the symptoms. Nothing relieves the symptoms. He has tried nothing for the symptoms. The treatment provided no relief.       Prior to Admission medications  Medication Sig Start Date End Date Taking? Authorizing Provider  apixaban  (ELIQUIS ) 5 MG TABS tablet Take 1 tablet (5 mg total) by mouth 2 (two) times daily. 06/19/24   Ghimire, Donalda HERO, MD  cetirizine  (ZYRTEC  ALLERGY ) 10 MG tablet Take 1 tablet (10 mg total) by mouth 2 (two) times daily as needed (itching, allergies). Patient taking differently: Take 10 mg by mouth 3 times/day as needed-between meals & bedtime (itching, allergies). 04/17/24   Luke Orlan HERO, DO  clobetasol  cream (TEMOVATE ) 0.05 % Apply 1 Application topically 2 (two) times daily as needed (breakout). 01/17/24   [provider]  empagliflozin  (JARDIANCE ) 10 MG TABS tablet Take 1 tablet by mouth daily.    [provider]  EPINEPHrine  0.3 mg/0.3 mL IJ SOAJ injection Inject 0.3 mg into the muscle as needed for anaphylaxis. 04/17/24   Luke Orlan HERO, DO  fenofibrate  160 MG tablet Take 160 mg by mouth daily.    [provider]  Fluocinolone  Acetonide Body (DERMA-SMOOTHE /FS BODY) 0.01 % OIL Apply once a day on the whole body. Once rash/itching clears up only use up to twice per week. 04/17/24   Luke Orlan HERO, DO   gabapentin  (NEURONTIN ) 100 MG capsule TK 1 C PO IN MORNING AND 3 CAPSULE HS 11/12/18   [provider]  insulin  aspart (NOVOLOG  FLEXPEN) 100 UNIT/ML FlexPen 0-9 Units, Subcutaneous, 3 times daily with meals CBG < 70: Implement Hypoglycemia measures CBG 70 - 120: 0 units CBG 121 - 150: 1 unit CBG 151 - 200: 2 units CBG 201 - 250: 3 units CBG 251 - 300: 5 units CBG 301 - 350: 7 units CBG 351 - 400: 9 units CBG > 400: call MD 06/19/24   Raenelle Donalda HERO, MD  insulin  glargine (LANTUS ) 100 UNIT/ML injection Inject 0.1 mLs (10 Units total) into the skin at bedtime. 06/19/24   Ghimire, Donalda HERO, MD  metFORMIN  (GLUCOPHAGE -XR) 500 MG 24 hr tablet TK 2 TS PO QAM AND 1 T QPM 11/14/18   [provider]  metoprolol  tartrate (LOPRESSOR ) 25 MG tablet Take 1 tablet (25 mg total) by mouth 2 (two) times daily. 06/19/24   Ghimire, Donalda HERO, MD  NONFORMULARY OR COMPOUNDED ITEM [facility] compound:  Peripheral Neuropathy cream - Bupivacaine 1%, Doxepin 3%, Gabapentin  6%, Pentoxifylline 3%, Topiramate 1%, dispense 120 grams, apply 1-2 grams to affected area 3-4 times daily, +2refills. 09/06/15   Gershon Donnice SAUNDERS, DPM  olmesartan (BENICAR) 20 MG tablet Take 20 mg by mouth daily.    [provider]  omeprazole (PRILOSEC) 20 MG capsule Take 20 mg by mouth daily.    [provider]  polyethylene  glycol (MIRALAX ) 17 g packet Take 17 g by mouth 2 (two) times a week.    [provider]  senna-docusate (SENOKOT-S) 8.6-50 MG tablet Take 1 tablet by mouth at bedtime as needed for moderate constipation. 06/19/24   Ghimire, Donalda HERO, MD  sertraline  (ZOLOFT ) 100 MG tablet Take 100 mg by mouth daily. 05/18/24   [provider]  sitaGLIPtin (JANUVIA) 100 MG tablet Take 1 tablet by mouth daily.    [provider]  triazolam (HALCION) 0.125 MG tablet TAKE 3 TABLETS BY MOUTH PRIOR TO DENTAL PROCEDURE AS DIRECTED BY DENTAL STAFF 03/07/18   [provider]     Allergies: Ace inhibitors, Iodine, Shellfish allergy , Shellfish protein-containing drug products, and Simvastatin    Review of Systems  Unable to perform ROS: Patient nonverbal (no preceding complaints per ems report)  Constitutional:  Negative for fever.  HENT:  Negative for congestion.   Respiratory:  Negative for cough, chest tightness and shortness of breath.   Cardiovascular:  Negative for chest pain.  Gastrointestinal:  Negative for abdominal pain, constipation, diarrhea, nausea and vomiting.  Genitourinary:  Negative for dysuria.  Musculoskeletal:  Negative for back pain and neck pain.  Skin:  Positive for wound.  Neurological:  Negative for headaches.  Psychiatric/Behavioral:  Negative for agitation.     Updated Vital Signs BP (!) 157/86 (BP Location: Right Arm)   Pulse 73   Temp 97.6 F (36.4 C) (Oral)   Resp 17   Ht 5' 10 (1.778 m)   Wt 73.9 kg   SpO2 100%   BMI 23.39 kg/m   Physical Exam Vitals and nursing note reviewed.  Constitutional:      General: He is not in acute distress.    Appearance: He is well-developed. He is not ill-appearing, toxic-appearing or diaphoretic.  HENT:     Head: Abrasion and contusion present.      Comments: Pupils symmetric and reactive with normal extract movements.  Symmetric smile.  He is nonverbal    Nose: No congestion or rhinorrhea.     Mouth/Throat:     Mouth: Mucous membranes are moist.     Pharynx: No oropharyngeal exudate or posterior oropharyngeal erythema.  Eyes:     Conjunctiva/sclera: Conjunctivae normal.  Neck:     Comments: In c collar Cardiovascular:     Rate and Rhythm: Normal rate and regular rhythm.     Heart sounds: No murmur heard. Pulmonary:     Effort: Pulmonary effort is normal. No respiratory distress.     Breath sounds: Normal breath sounds. No wheezing, rhonchi or rales.  Chest:     Chest wall: No tenderness.  Abdominal:     General: Abdomen is flat.     Palpations: Abdomen is soft.      Tenderness: There is no abdominal tenderness.  Musculoskeletal:        General: No swelling or tenderness.  Skin:    General: Skin is warm and dry.     Capillary Refill: Capillary refill takes less than 2 seconds.     Findings: Bruising (abrasion as well) present. No erythema or rash.  Neurological:     Mental Status: He is alert.     Motor: Weakness present.  Psychiatric:        Mood and Affect: Mood normal.     (all labs ordered are listed, but only abnormal results are displayed) Labs Reviewed - No data to display  EKG: None  Radiology: CT Head Wo Contrast Result Date:  06/24/2024 EXAM: CT HEAD WITHOUT CONTRAST 06/24/2024 09:44:24 AM TECHNIQUE: CT of the head was performed without the administration of intravenous contrast. Automated exposure control, iterative reconstruction, and/or weight based adjustment of the mA/kV was utilized to reduce the radiation dose to as low as reasonably achievable. COMPARISON: CT head 06/14/2024. CLINICAL HISTORY: 59 year old male. Patient is on blood thinners. Fall striking the right side of the head on the floor. FINDINGS: BRAIN AND VENTRICLES: No acute hemorrhage. No evidence of acute infarct. No hydrocephalus. No extra-axial collection. No mass effect or midline shift. Stable brain volume, no disproportionate brain atrophy. Gray white differentiation stable and within normal limits. No suspicious intracranial vascular hyperdensity. Faint calcified atherosclerosis at the skull base. ORBITS: No acute abnormality. No discrete orbital injury. SINUSES: Scattered small paranasal sinus retention cysts are stable. Generally well aerated paranasal sinuses, tympanic cavities and mastoids. SOFT TISSUES AND SKULL: No acute soft tissue abnormality. No skull fracture. No discrete scalp soft tissue injury. Incidental duodenal calcification. IMPRESSION: 1. No acute traumatic injury identified. 2. Stable and negative for age non-contrast CT appearance of the brain.  Electronically signed by: Helayne Hurst MD 06/24/2024 09:59 AM EST RP Workstation: [id number]   CT Cervical Spine Wo Contrast Result Date: 06/24/2024 EXAM: CT CERVICAL SPINE WITHOUT CONTRAST 06/24/2024 09:44:24 AM TECHNIQUE: CT of the cervical spine was performed without the administration of intravenous contrast. Multiplanar reformatted images are provided for review. Automated exposure control, iterative reconstruction, and/or weight based adjustment of the mA/kV was utilized to reduce the radiation dose to as low as reasonably achievable. COMPARISON: CT head reported separately today, CT cervical spine 06/15/2024. CLINICAL HISTORY: 59 year old male. Polytrauma, blunt. Ball striking the right side of the head on the floor, on blood thinners. FINDINGS: BONES AND ALIGNMENT: Stable cervical lordosis. Normal background bone mineralization. No acute osseous abnormality. DEGENERATIVE CHANGES: Chronic degenerative appearing asymmetric hypertrophy and osteophytosis of the right side cervical facets, with areas of right side ligamentum flavum hypertrophy superimposed. Cervical disc and endplate degeneration superimposed. Widespread chronic degenerative cervical spinal stenosis suspected and stable by CT. SOFT TISSUES: Stable and negative visible non-contrast neck soft tissues. Negative visible non-contrast thoracic inlet. IMPRESSION: 1. No acute traumatic injury identified in the cervical spine. 2. Stable by CT chronic cervical spine degeneration, suspected multilevel spinal stenosis Electronically signed by: Helayne Hurst MD 06/24/2024 09:56 AM EST RP Workstation: [id number]     Procedures   Medications Ordered in the ED - No data to display                                  Medical Decision Making Amount and/or Complexity of Data Reviewed Radiology: ordered.    Eric Cummings is a 59 y.o. male with a past medical history significant for profound intellectual disability and nonverbal autism, type 1  diabetes, hyperlipidemia, hypertension, diabetic kidney disease, and recent stroke due to new atrial fibrillation now on Eliquis  therapy who presents for fall.  According to EMS report, patient was standing in his room at his facility today and when the caretaker was turning around the patient had a fall.  He was on the ground and hit his right forehead on the ground with an abrasion to his right eyebrow.  He did not lose consciousness and is otherwise acting at his baseline.  Patient has minimal verbal interactions but is at his baseline per EMS report.  Otherwise he has had no other symptoms with no recent fevers,  chills, congestion, cough, nausea, vomiting, constipation, diarrhea, or urinary changes by report.  Patient is resting comfortably now without acute distress.  On my exam, lungs clear and chest nontender.  Abdomen nontender.  Patient moving extremities but he does have weakness in his right arm and right leg compared to the left.  EMS reports this is unchanged from prior by report.  I did not see facial droop.  Patient has a very small abrasion to his right lateral eyebrow but pupils are symmetric and reactive with normal extraocular movements.  No evidence of entrapment.  Facial area nontender.  Patient is in a c-collar.  Rest of exam unremarkable.  Given his lack of focal tenderness from the neck down we will hold on x-ray imaging or large workup however we will get CT of the head and neck to look for significant traumatic injuries.  Tetanus appears to be up-to-date for the small abrasion.  It is hemostatic at this time.  Will clean and dress his wound as I do not feel it needs laceration repair at this time.  Will get the CT imaging of the head and neck but otherwise we will hold on further workup at this time.  Anticipate discharge after workup if imaging is reassuring and patient remains at his baseline by report.  10:40 AM Family/caregiver arrived and patient was reassessed.  CT imaging  reassuring of the head and neck.  No skull fracture or intracranial bleeding seen.  No neck injury seen.  Collar was removed and patient is at his baseline per family now.  We again looked at the wound with family and they agree with some cleaning and bandaging.  I cleaned the wound with chlorhexidine  and used bacitracin and a Band-Aid.  Patient will follow-up with PCP.  Patient's family understood plan of care and agreed and patient will be discharged for outpatient follow-up.      Final diagnoses:  Fall, initial encounter  Abrasion of face, initial encounter    ED Discharge Orders     None       Clinical Impression: 1. Fall, initial encounter   2. Abrasion of face, initial encounter     Disposition: Discharge  Condition: Good  I have discussed the results, Dx and Tx plan with the pt(& family if present). He/she/they expressed understanding and agree(s) with the plan. Discharge instructions discussed at great length. Strict return precautions discussed and pt &/or family have verbalized understanding of the instructions. No further questions at time of discharge.    New Prescriptions   No medications on file    Follow Up: Onita Rush, MD [address] [place] [address] [phone]     [facility] Emergency Department at [facility] [address] [place]   [address] [phone]       Malerie Eakins, Lonni PARAS, MD 06/24/24 1042  "

## 2024-06-24 NOTE — ED Notes (Signed)
 Patient transported to CT

## 2024-06-24 NOTE — ED Notes (Signed)
 Called for pt pick up

## 2024-06-24 NOTE — ED Triage Notes (Signed)
 Pt BIB [facility] from [facility] due to fall.  Pt stuck right side of head on the floor and takes Eliquis . Hx CVA; autistic and non-verbal at baseline.  VSS; CBG 334

## 2024-06-24 NOTE — Discharge Instructions (Signed)
 Your history, exam, and evaluation today revealed no significant traumatic injury to the head or neck after reassuring CT scans.  We were able to clean and dress your wound and there was no deep laceration requiring sutures or glue at this time.  As you had no preceding symptoms and are otherwise at your baseline we agreed to hold on extensive lab or imaging workup with a shared decision-making conversation.  Please rest and stay hydrated and follow-up with your primary team.  Please continue your home medications.  If any symptoms change or worsen acutely, please return to the nearest emergency department.

## 2024-06-28 ENCOUNTER — Encounter (HOSPITAL_COMMUNITY): Payer: Self-pay

## 2024-06-28 ENCOUNTER — Inpatient Hospital Stay (HOSPITAL_COMMUNITY)
Admission: EM | Admit: 2024-06-28 | Discharge: 2024-07-03 | DRG: 871 | Disposition: A | Discharge status: Skilled Nursing Facility | Source: Skilled Nursing Facility | Attending: Pulmonary Disease | Admitting: Pulmonary Disease

## 2024-06-28 ENCOUNTER — Inpatient Hospital Stay (HOSPITAL_COMMUNITY)

## 2024-06-28 ENCOUNTER — Emergency Department (HOSPITAL_COMMUNITY)

## 2024-06-28 ENCOUNTER — Other Ambulatory Visit: Payer: Self-pay

## 2024-06-28 DIAGNOSIS — R34 Anuria and oliguria: Secondary | ICD-10-CM | POA: Diagnosis present

## 2024-06-28 DIAGNOSIS — I4581 Long QT syndrome: Secondary | ICD-10-CM

## 2024-06-28 DIAGNOSIS — I10 Essential (primary) hypertension: Secondary | ICD-10-CM | POA: Diagnosis present

## 2024-06-28 DIAGNOSIS — Z79899 Other long term (current) drug therapy: Secondary | ICD-10-CM | POA: Diagnosis not present

## 2024-06-28 DIAGNOSIS — K219 Gastro-esophageal reflux disease without esophagitis: Secondary | ICD-10-CM | POA: Diagnosis present

## 2024-06-28 DIAGNOSIS — I959 Hypotension, unspecified: Secondary | ICD-10-CM | POA: Diagnosis not present

## 2024-06-28 DIAGNOSIS — E860 Dehydration: Secondary | ICD-10-CM | POA: Diagnosis present

## 2024-06-28 DIAGNOSIS — G9341 Metabolic encephalopathy: Secondary | ICD-10-CM | POA: Diagnosis present

## 2024-06-28 DIAGNOSIS — Z7984 Long term (current) use of oral hypoglycemic drugs: Secondary | ICD-10-CM | POA: Diagnosis not present

## 2024-06-28 DIAGNOSIS — R5381 Other malaise: Secondary | ICD-10-CM | POA: Diagnosis present

## 2024-06-28 DIAGNOSIS — Z8679 Personal history of other diseases of the circulatory system: Secondary | ICD-10-CM

## 2024-06-28 DIAGNOSIS — F39 Unspecified mood [affective] disorder: Secondary | ICD-10-CM | POA: Diagnosis present

## 2024-06-28 DIAGNOSIS — Z91041 Radiographic dye allergy status: Secondary | ICD-10-CM

## 2024-06-28 DIAGNOSIS — E871 Hypo-osmolality and hyponatremia: Secondary | ICD-10-CM

## 2024-06-28 DIAGNOSIS — Z7901 Long term (current) use of anticoagulants: Secondary | ICD-10-CM

## 2024-06-28 DIAGNOSIS — F840 Autistic disorder: Secondary | ICD-10-CM | POA: Diagnosis present

## 2024-06-28 DIAGNOSIS — E1165 Type 2 diabetes mellitus with hyperglycemia: Secondary | ICD-10-CM | POA: Diagnosis present

## 2024-06-28 DIAGNOSIS — E11649 Type 2 diabetes mellitus with hypoglycemia without coma: Secondary | ICD-10-CM | POA: Diagnosis not present

## 2024-06-28 DIAGNOSIS — N179 Acute kidney failure, unspecified: Secondary | ICD-10-CM | POA: Diagnosis present

## 2024-06-28 DIAGNOSIS — Z825 Family history of asthma and other chronic lower respiratory diseases: Secondary | ICD-10-CM

## 2024-06-28 DIAGNOSIS — I69351 Hemiplegia and hemiparesis following cerebral infarction affecting right dominant side: Secondary | ICD-10-CM

## 2024-06-28 DIAGNOSIS — E870 Hyperosmolality and hypernatremia: Secondary | ICD-10-CM | POA: Diagnosis present

## 2024-06-28 DIAGNOSIS — I482 Chronic atrial fibrillation, unspecified: Secondary | ICD-10-CM | POA: Diagnosis present

## 2024-06-28 DIAGNOSIS — Z818 Family history of other mental and behavioral disorders: Secondary | ICD-10-CM | POA: Diagnosis not present

## 2024-06-28 DIAGNOSIS — Z794 Long term (current) use of insulin: Secondary | ICD-10-CM

## 2024-06-28 DIAGNOSIS — Z91013 Allergy to seafood: Secondary | ICD-10-CM

## 2024-06-28 DIAGNOSIS — R571 Hypovolemic shock: Principal | ICD-10-CM | POA: Diagnosis present

## 2024-06-28 DIAGNOSIS — R400 Somnolence: Secondary | ICD-10-CM | POA: Diagnosis not present

## 2024-06-28 DIAGNOSIS — Z1152 Encounter for screening for COVID-19: Secondary | ICD-10-CM

## 2024-06-28 DIAGNOSIS — Z888 Allergy status to other drugs, medicaments and biological substances status: Secondary | ICD-10-CM

## 2024-06-28 DIAGNOSIS — E872 Acidosis, unspecified: Secondary | ICD-10-CM | POA: Diagnosis present

## 2024-06-28 DIAGNOSIS — E8729 Other acidosis: Secondary | ICD-10-CM | POA: Diagnosis not present

## 2024-06-28 DIAGNOSIS — Z8673 Personal history of transient ischemic attack (TIA), and cerebral infarction without residual deficits: Secondary | ICD-10-CM | POA: Diagnosis not present

## 2024-06-28 DIAGNOSIS — R4182 Altered mental status, unspecified: Principal | ICD-10-CM | POA: Diagnosis present

## 2024-06-28 DIAGNOSIS — L8990 Pressure ulcer of unspecified site, unspecified stage: Secondary | ICD-10-CM | POA: Insufficient documentation

## 2024-06-28 DIAGNOSIS — I4891 Unspecified atrial fibrillation: Secondary | ICD-10-CM | POA: Diagnosis not present

## 2024-06-28 HISTORY — DX: Other developmental disorders of scholastic skills: F81.89

## 2024-06-28 HISTORY — DX: Autistic disorder: F84.0

## 2024-06-28 LAB — I-STAT VENOUS BLOOD GAS, ED
Acid-base deficit: 4 mmol/L — ABNORMAL HIGH (ref 0.0–2.0)
Acid-base deficit: 7 mmol/L — ABNORMAL HIGH (ref 0.0–2.0)
Bicarbonate: 24.7 mmol/L (ref 20.0–28.0)
Bicarbonate: 20.4 mmol/L (ref 20.0–28.0)
Calcium, Ion: 1.27 mmol/L (ref 1.15–1.40)
Calcium, Ion: 1.17 mmol/L (ref 1.15–1.40)
HCT: 46 % (ref 39.0–52.0)
HCT: 48 % (ref 39.0–52.0)
Hemoglobin: 15.6 g/dL (ref 13.0–17.0)
Hemoglobin: 16.3 g/dL (ref 13.0–17.0)
O2 Saturation: 67 %
O2 Saturation: 86 %
Potassium: 3.7 mmol/L (ref 3.5–5.1)
Potassium: 4.3 mmol/L (ref 3.5–5.1)
Sodium: 163 mmol/L (ref 135–145)
Sodium: 163 mmol/L (ref 135–145)
TCO2: 26 mmol/L (ref 22–32)
TCO2: 22 mmol/L (ref 22–32)
pCO2, Ven: 57.8 mmHg (ref 44–60)
pCO2, Ven: 46 mmHg (ref 44–60)
pH, Ven: 7.239 — ABNORMAL LOW (ref 7.25–7.43)
pH, Ven: 7.255 (ref 7.25–7.43)
pO2, Ven: 42 mmHg (ref 32–45)
pO2, Ven: 60 mmHg — ABNORMAL HIGH (ref 32–45)

## 2024-06-28 LAB — I-STAT CG4 LACTIC ACID, ED
Lactic Acid, Venous: 3.7 mmol/L (ref 0.5–1.9)
Lactic Acid, Venous: 2.3 mmol/L (ref 0.5–1.9)
Lactic Acid, Venous: 4 mmol/L (ref 0.5–1.9)

## 2024-06-28 LAB — RESP PANEL BY RT-PCR (RSV, FLU A&B, COVID)  RVPGX2
Influenza A by PCR: NEGATIVE
Influenza B by PCR: NEGATIVE
Resp Syncytial Virus by PCR: NEGATIVE
SARS Coronavirus 2 by RT PCR: NEGATIVE

## 2024-06-28 LAB — COMPREHENSIVE METABOLIC PANEL WITH GFR
ALT: 42 U/L (ref 0–44)
AST: 47 U/L — ABNORMAL HIGH (ref 15–41)
Albumin: 3.3 g/dL — ABNORMAL LOW (ref 3.5–5.0)
Alkaline Phosphatase: 81 U/L (ref 38–126)
Anion gap: 14 (ref 5–15)
BUN: 104 mg/dL — ABNORMAL HIGH (ref 6–20)
CO2: 20 mmol/L — ABNORMAL LOW (ref 22–32)
Calcium: 9 mg/dL (ref 8.9–10.3)
Chloride: 128 mmol/L — ABNORMAL HIGH (ref 98–111)
Creatinine, Ser: 2.48 mg/dL — ABNORMAL HIGH (ref 0.61–1.24)
GFR, Estimated: 29 mL/min — ABNORMAL LOW
Glucose, Bld: 464 mg/dL — ABNORMAL HIGH (ref 70–99)
Potassium: 4.4 mmol/L (ref 3.5–5.1)
Sodium: 161 mmol/L (ref 135–145)
Total Bilirubin: 0.4 mg/dL (ref 0.0–1.2)
Total Protein: 6.2 g/dL — ABNORMAL LOW (ref 6.5–8.1)

## 2024-06-28 LAB — CBC WITH DIFFERENTIAL/PLATELET
Abs Immature Granulocytes: 0.06 10*3/uL (ref 0.00–0.07)
Basophils Absolute: 0 10*3/uL (ref 0.0–0.1)
Basophils Relative: 0 %
Eosinophils Absolute: 0 10*3/uL (ref 0.0–0.5)
Eosinophils Relative: 0 %
HCT: 52.1 % — ABNORMAL HIGH (ref 39.0–52.0)
Hemoglobin: 15.6 g/dL (ref 13.0–17.0)
Immature Granulocytes: 0 %
Lymphocytes Relative: 7 %
Lymphs Abs: 1.1 10*3/uL (ref 0.7–4.0)
MCH: 28.3 pg (ref 26.0–34.0)
MCHC: 29.9 g/dL — ABNORMAL LOW (ref 30.0–36.0)
MCV: 94.4 fL (ref 80.0–100.0)
Monocytes Absolute: 0.4 10*3/uL (ref 0.1–1.0)
Monocytes Relative: 2 %
Neutro Abs: 13.2 10*3/uL — ABNORMAL HIGH (ref 1.7–7.7)
Neutrophils Relative %: 91 %
Platelets: 214 10*3/uL (ref 150–400)
RBC: 5.52 MIL/uL (ref 4.22–5.81)
RDW: 13.8 % (ref 11.5–15.5)
WBC: 14.7 10*3/uL — ABNORMAL HIGH (ref 4.0–10.5)
nRBC: 0 % (ref 0.0–0.2)

## 2024-06-28 LAB — GLUCOSE, CAPILLARY: Glucose-Capillary: 343 mg/dL — ABNORMAL HIGH (ref 70–99)

## 2024-06-28 LAB — BASIC METABOLIC PANEL WITH GFR
Anion gap: 12 (ref 5–15)
BUN: 94 mg/dL — ABNORMAL HIGH (ref 6–20)
CO2: 18 mmol/L — ABNORMAL LOW (ref 22–32)
Calcium: 8.2 mg/dL — ABNORMAL LOW (ref 8.9–10.3)
Chloride: 126 mmol/L — ABNORMAL HIGH (ref 98–111)
Creatinine, Ser: 2.18 mg/dL — ABNORMAL HIGH (ref 0.61–1.24)
GFR, Estimated: 34 mL/min — ABNORMAL LOW
Glucose, Bld: 405 mg/dL — ABNORMAL HIGH (ref 70–99)
Potassium: 5.1 mmol/L (ref 3.5–5.1)
Sodium: 156 mmol/L — ABNORMAL HIGH (ref 135–145)

## 2024-06-28 LAB — I-STAT CHEM 8, ED
BUN: 101 mg/dL — ABNORMAL HIGH (ref 6–20)
Calcium, Ion: 1.14 mmol/L — ABNORMAL LOW (ref 1.15–1.40)
Chloride: 129 mmol/L — ABNORMAL HIGH (ref 98–111)
Creatinine, Ser: 2.5 mg/dL — ABNORMAL HIGH (ref 0.61–1.24)
Glucose, Bld: 445 mg/dL — ABNORMAL HIGH (ref 70–99)
HCT: 49 % (ref 39.0–52.0)
Hemoglobin: 16.7 g/dL (ref 13.0–17.0)
Potassium: 4.3 mmol/L (ref 3.5–5.1)
Sodium: 164 mmol/L (ref 135–145)
TCO2: 20 mmol/L — ABNORMAL LOW (ref 22–32)

## 2024-06-28 LAB — CORTISOL: Cortisol, Plasma: 26.4 ug/dL

## 2024-06-28 LAB — TSH: TSH: 0.586 u[IU]/mL (ref 0.350–4.500)

## 2024-06-28 LAB — T4, FREE: Free T4: 0.92 ng/dL (ref 0.80–2.00)

## 2024-06-28 LAB — MAGNESIUM: Magnesium: 2.4 mg/dL (ref 1.7–2.4)

## 2024-06-28 LAB — MRSA NEXT GEN BY PCR, NASAL: MRSA by PCR Next Gen: NOT DETECTED

## 2024-06-28 LAB — BETA-HYDROXYBUTYRIC ACID: Beta-Hydroxybutyric Acid: 0.17 mmol/L (ref 0.05–0.27)

## 2024-06-28 MED ORDER — INSULIN GLARGINE-YFGN 100 UNIT/ML ~~LOC~~ SOLN
10.0000 [IU] | Freq: Once | SUBCUTANEOUS | Status: AC
  Administered 2024-06-29: 10 [IU] via SUBCUTANEOUS
  Filled 2024-06-28: qty 0.1

## 2024-06-28 MED ORDER — CHLORHEXIDINE GLUCONATE CLOTH 2 % EX PADS: 6.0000 | MEDICATED_PAD | Freq: Every day | CUTANEOUS | Status: DC

## 2024-06-28 MED ORDER — LACTATED RINGERS IV BOLUS
1000.0000 mL | Freq: Once | INTRAVENOUS | Status: AC
  Administered 2024-06-28: 1000 mL via INTRAVENOUS

## 2024-06-28 MED ORDER — SENNA 8.6 MG PO TABS: 1.0000 | ORAL_TABLET | Freq: Two times a day (BID) | ORAL | Status: DC | PRN

## 2024-06-28 MED ORDER — PANTOPRAZOLE SODIUM 40 MG IV SOLR
40.0000 mg | INTRAVENOUS | Status: DC
  Administered 2024-06-28 – 2024-06-29 (×2): 40 mg via INTRAVENOUS
  Filled 2024-06-28 (×2): qty 10

## 2024-06-28 MED ORDER — LACTATED RINGERS IV BOLUS
1000.0000 mL | Freq: Once | INTRAVENOUS | Status: AC
  Administered 2024-06-29: 1000 mL via INTRAVENOUS

## 2024-06-28 MED ORDER — INSULIN ASPART 100 UNIT/ML IJ SOLN
0.0000 [IU] | INTRAMUSCULAR | Status: DC
  Administered 2024-06-28 – 2024-06-29 (×2): 11 [IU] via SUBCUTANEOUS
  Filled 2024-06-28 (×2): qty 11

## 2024-06-28 MED ORDER — ORAL CARE MOUTH RINSE: 15.0000 mL | OROMUCOSAL | Status: DC | PRN

## 2024-06-28 MED ORDER — ALBUMIN HUMAN 5 % IV SOLN
25.0000 g | Freq: Once | INTRAVENOUS | Status: AC
  Administered 2024-06-28: 25 g via INTRAVENOUS
  Filled 2024-06-28: qty 500

## 2024-06-28 MED ORDER — SODIUM CHLORIDE 0.9 % IV SOLN: INTRAVENOUS | Status: DC

## 2024-06-28 MED ORDER — SODIUM CHLORIDE 0.9 % IV BOLUS
1000.0000 mL | Freq: Once | INTRAVENOUS | Status: AC
  Administered 2024-06-28: 1000 mL via INTRAVENOUS

## 2024-06-28 MED ORDER — POLYETHYLENE GLYCOL 3350 17 G PO PACK: 17.0000 g | PACK | Freq: Every day | ORAL | Status: DC | PRN

## 2024-06-28 MED ORDER — CHLORHEXIDINE GLUCONATE CLOTH 2 % EX PADS
6.0000 | MEDICATED_PAD | Freq: Every day | CUTANEOUS | Status: DC
  Administered 2024-06-29 – 2024-07-03 (×5): 6 via TOPICAL

## 2024-06-28 MED ORDER — SODIUM CHLORIDE 0.45 % IV SOLN: INTRAVENOUS | Status: DC

## 2024-06-28 MED ORDER — NOREPINEPHRINE 4 MG/250ML-% IV SOLN
0.0000 ug/min | INTRAVENOUS | Status: DC
  Administered 2024-06-28: 2 ug/min via INTRAVENOUS
  Administered 2024-06-28: 23 ug/min via INTRAVENOUS
  Administered 2024-06-29: 10 ug/min via INTRAVENOUS
  Administered 2024-06-29: 21 ug/min via INTRAVENOUS
  Filled 2024-06-28 (×3): qty 250

## 2024-06-28 NOTE — Plan of Care (Signed)
 "    Eric Cummings FMW:[id number] DOB: 1966/05/12 DOA: 06/28/2024     PCP: Onita Rush, MD   Outpatient Specialists:   CARDS Dr. Emeline FORBES Calender, DO  NEphrology: *  Dr. No care team member to display  NEurology *   Dr. Pulmonary *  Dr.  Oncology * Dr.No care team member to display  GI* Dr.  Gwen, LB) No care team member to display Urology Dr. *  Patient arrived to ER on 06/28/24 at 1731 Referred by Attending Dasie Faden, MD   Patient coming from:    From facility   [name]  Chief Complaint:   Chief Complaint  Patient presents with   Code Sepsis    HPI: Eric Cummings is a 59 y.o. male with medical history significant of stroke, autism, dM2  Presented with hypotension hyperglycemia Patient comes from [name] for hypotension and change in mental status patient is nonverbal at baseline has been noted to have hyperglycemia at the facility up to 550 hypotensive down to 76/52 EMS gave 500 mL normal saline and epinephrine  Patient arrived to [facility], ER on nonrebreather Apparently per history patient have had a stroke recently He is on Eliquis  While in the emergency department after IV fluid rehydration his epinephrine  drip was able to be discontinued.  His O2 was able to be weaned down to 2 L and he is satting 100%     Denies significant ETOH intake *** Does not smoke*** but interested in quitting***  Denies marijuana use ***    Regarding pertinent Chronic problems: ***  ****Hyperlipidemia - *on statins {statin:315258}  Lipid Panel     Component Value Date/Time   CHOL 111 06/16/2024 0031   TRIG 101 06/16/2024 0031   HDL 42 06/16/2024 0031   CHOLHDL 2.7 06/16/2024 0031   VLDL 20 06/16/2024 0031   LDLCALC 49 06/16/2024 0031    ***HTN on   ***chronic CHF diastolic/systolic/ combined - last echo*** Recent Results (from the past 56199 hours)  ECHOCARDIOGRAM COMPLETE   Collection Time: 06/16/24  4:42 PM  Result Value   Weight 2,610.25    Height 70   BP 144/82   Single Plane A2C EF 58.8   S' Lateral 3.60   Est EF 55 - 60%   Narrative      ECHOCARDIOGRAM REPORT       Patient Name:   Eric Cummings Date of Exam: 06/16/2024 Medical Rec #:  [id number]           Height:       70.0 in Accession #:    [id number]          Weight:       163.1 lb Date of Birth:  Sep 29, 1965            BSA:          1.914 m Patient Age:    58 years            BP:           133/74 mmHg Patient Gender: M                   HR:           74 bpm. Exam Location:  Inpatient  Procedure: 2D Echo, Cardiac Doppler and Color Doppler (Both Spectral and Color            Flow Doppler were utilized during procedure).  Indications:    Atrial Fibrillation I48.91  History:        Patient has no prior history of Echocardiogram examinations.                 CVA, Arrythmias:Atrial Fibrillation; Risk Factors:Diabetes,                 Dyslipidemia and Hypertension.   Sonographer:    Koleen Popper RDCS Referring Phys: VIJAYA AKULA  IMPRESSIONS    1. Left ventricular ejection fraction, by estimation, is 55 to 60%. The left ventricle has normal function. The left ventricle has no regional wall motion abnormalities. Left ventricular diastolic parameters were normal.  2. Right ventricular systolic function is normal. The right ventricular size is normal. There is normal pulmonary artery systolic pressure.  3. Trivial mitral valve regurgitation.  4. The aortic valve is tricuspid. Aortic valve regurgitation is not visualized. Aortic valve sclerosis/calcification is present, without any evidence of aortic stenosis.  5. The inferior vena cava is normal in size with greater than 50% respiratory variability, suggesting right atrial pressure of 3 mmHg.  FINDINGS  Left Ventricle: Left ventricular ejection fraction, by estimation, is 55 to 60%. The left ventricle has normal function. The left ventricle has no regional wall motion abnormalities. The left ventricular  internal cavity size was normal in size. There is  no left ventricular hypertrophy. Left ventricular diastolic parameters were normal.  Right Ventricle: The right ventricular size is normal. Right vetricular wall thickness was not assessed. Right ventricular systolic function is normal. There is normal pulmonary artery systolic pressure. The tricuspid regurgitant velocity is 2.71 m/s,  and with an assumed right atrial pressure of 3 mmHg, the estimated right ventricular systolic pressure is 32.4 mmHg.  Left Atrium: Left atrial size was normal in size.  Right Atrium: Right atrial size was normal in size.  Pericardium: There is no evidence of pericardial effusion.  Mitral Valve: There is mild thickening of the mitral valve leaflet(s). Trivial mitral valve regurgitation.  Tricuspid Valve: The tricuspid valve is normal in structure. Tricuspid valve regurgitation is mild.  Aortic Valve: The aortic valve is tricuspid. Aortic valve regurgitation is not visualized. Aortic valve sclerosis/calcification is present, without any evidence of aortic stenosis.  Pulmonic Valve: The pulmonic valve was normal in structure. Pulmonic valve regurgitation is trivial.  Aorta: The aortic root and ascending aorta are structurally normal, with no evidence of dilitation.  Venous: The inferior vena cava is normal in size with greater than 50% respiratory variability, suggesting right atrial pressure of 3 mmHg.  IAS/Shunts: No atrial level shunt detected by color flow Doppler.    LEFT VENTRICLE PLAX 2D LVIDd:         5.10 cm LVIDs:         3.60 cm LV PW:         1.00 cm LV IVS:        1.10 cm LVOT diam:     1.90 cm LV SV:         52 LV SV Index:   27 LVOT Area:     2.84 cm   LV Volumes (MOD) LV vol d, MOD A2C: 171.0 ml LV vol s, MOD A2C: 70.4 ml LV SV MOD A2C:     100.6 ml  RIGHT VENTRICLE             IVC RV Basal diam:  3.80 cm     IVC diam: 1.80 cm RV S prime:     16.10 cm/s TAPSE (M-mode): 1.9  cm  LEFT  ATRIUM             Index        RIGHT ATRIUM           Index LA diam:        3.90 cm 2.04 cm/m   RA Area:     18.30 cm LA Vol (A2C):   45.6 ml 23.82 ml/m  RA Volume:   50.20 ml  26.22 ml/m LA Vol (A4C):   48.6 ml 25.39 ml/m LA Biplane Vol: 48.6 ml 25.39 ml/m  AORTIC VALVE LVOT Vmax:   98.50 cm/s LVOT Vmean:  65.000 cm/s LVOT VTI:    0.182 m   AORTA Ao Root diam: 3.60 cm Ao Asc diam:  3.30 cm  TRICUSPID VALVE TR Peak grad:   29.4 mmHg TR Mean grad:   20.0 mmHg TR Vmax:        271.00 cm/s TR Vmean:       210.0 cm/s   SHUNTS Systemic VTI:  0.18 m Systemic Diam: 1.90 cm  Vina Gull MD Electronically signed by Vina Gull MD Signature Date/Time: 06/16/2024/4:35:56 PM       Final     *Note: Due to a large number of results and/or encounters for the requested time period, some results have not been displayed. A complete set of results can be found in Results Review.    *** CAD  - On Aspirin , statin, betablocker, Plavix                 - *followed by cardiology                - last cardiac cath       ***DM 2 -  Lab Results  Component Value Date   HGBA1C 8.7 (H) 06/15/2024   ****on insulin , PO meds only, diet controlled  ***Hypothyroidism:   Lab Results  Component Value Date   TSH 1.420 03/20/2024   on synthroid  *** Morbid obesity-   BMI Readings from Last 1 Encounters:  06/24/24 23.39 kg/m     *** Asthma -well *** controlled on home inhalers/ nebs                     *** COPD - not **followed by pulmonology *** not  on baseline oxygen  *L,    *** OSA -on nocturnal oxygen, *CPAP, *noncompliant with CPAP  *** Hx of CVA - *with/out residual deficits on Aspirin  81 mg, 325, Plavix  ***A. Fib -   atrial fibrillation CHA2DS2 vas score **** CHA2DS2/VAS Stroke Risk Points  Current as of 10 minutes ago     4 >= 2 Points: High Risk  1 to 1.99 Points: Medium Risk  0 Points: Low Risk    Last Change:       Details    This score determines the  patient's risk of having a stroke if the  patient has atrial fibrillation.       Points Metrics  0 Has Congestive Heart Failure:  No    Current as of 10 minutes ago  0 Has Vascular Disease:  No    Current as of 10 minutes ago  1 Has Hypertension:  Yes    Current as of 10 minutes ago  0 Age:  39    Current as of 10 minutes ago  1 Has Diabetes Excluding Gestational Diabetes:  Yes    Current as of 10 minutes ago  2 Had Stroke:  Yes  Had  TIA:  No  Had Thromboembolism:  No    Current as of 10 minutes ago  0 Male:  No    Current as of 10 minutes ago           current  on anticoagulation with ****Coumadin  ***Xarelto,* Eliquis ,  *** Not on anticoagulation secondary to Risk of Falls, *** recurrent bleeding         -  Rate control:  Currently controlled with ***Toprolol,  *Metoprolol ,* Diltiazem , *Coreg          - Rhythm control: *** amiodarone , *flecainide  ***Hx of DVT/PE on - anticoagulation with ****Coumadin  ***Xarelto,* Eliquis ,     ***CKD stage III*-   baseline Cr **** Estimated Creatinine Clearance: 33.3 mL/min (A) (by C-G formula based on SCr of 2.5 mg/dL (H)).  Lab Results  Component Value Date   CREATININE 2.50 (H) 06/28/2024   CREATININE 2.48 (H) 06/28/2024   CREATININE 1.22 06/16/2024   Lab Results  Component Value Date   NA 163 (HH) 06/28/2024   CL 129 (H) 06/28/2024   K 4.3 06/28/2024   CO2 20 (L) 06/28/2024   BUN 101 (H) 06/28/2024   CREATININE 2.50 (H) 06/28/2024   GFRNONAA 29 (L) 06/28/2024   CALCIUM 9.0 06/28/2024   ALBUMIN  3.3 (L) 06/28/2024   GLUCOSE 445 (H) 06/28/2024    **** Liver disease MELD 3.0: 11 at 06/16/2024 12:31 AM MELD-Na: 8 at 06/16/2024 12:31 AM Calculated from: Serum Creatinine: 1.22 mg/dL at 8/83/7973 87:68 AM Serum Sodium: 133 mmol/L at 06/16/2024 12:31 AM Total Bilirubin: 0.5 mg/dL (Using min of 1 mg/dL) at 8/85/7973  7:99 PM Serum Albumin : 4.5 g/dL (Using max of 3.5 g/dL) at 8/85/7973  7:99 PM INR(ratio): 1 at 06/14/2024   2:00 PM Age at listing (hypothetical): 58 years Sex: Male at 06/16/2024 12:31 AM  Hepatic Function Panel     Component Value Date/Time   PROT 6.2 (L) 06/28/2024 1848   PROT 7.6 03/20/2024 1526   ALBUMIN  3.3 (L) 06/28/2024 1848   ALBUMIN  4.7 03/20/2024 1526   AST 47 (H) 06/28/2024 1848   ALT 42 06/28/2024 1848   ALKPHOS 81 06/28/2024 1848   BILITOT 0.4 06/28/2024 1848   BILITOT 0.5 03/20/2024 1526   1.0  ***BPH - on Flomax, Proscar    *** Dementia - on Aricept** Nemenda  *** Chronic anemia - baseline hg Hemoglobin & Hematocrit  Recent Labs    06/28/24 1848 06/28/24 1858 06/28/24 1859  HGB 15.6 16.7 16.3   Iron/TIBC/Ferritin/ %Sat No results found for: IRON, TIBC, FERRITIN, IRONPCTSAT   Seizure DO - las seizure *** currently on     Cancer:     While in ER:         Lab Orders         Culture, blood (Routine X 2) w Reflex to ID Panel         Resp panel by RT-PCR (RSV, Flu A&B, Covid) Anterior Nasal Swab         CBC with Differential/Platelet         Comprehensive metabolic panel with GFR         Urinalysis, w/ Reflex to Culture (Infection Suspected) -Urine, Clean Catch         Beta-hydroxybutyric acid         I-stat chem 8, ed         I-Stat arterial blood gas, ED ([facility], [facility], [facility])         I-Stat venous blood  gas, ED         I-Stat venous blood gas, ED         I-Stat CG4 Lactic Acid      CT HEAD *** NON acute   MRI brain  ***no acute CVA  CXR - ***NON acute  CTabd/pelvis - ***nonacute  CTA chest - ***nonacute, no PE, * no evidence of infiltrate  Following Medications were ordered in ER: Medications  sodium chloride  0.9 % bolus 1,000 mL (0 mLs Intravenous Stopped 06/28/24 1948)  lactated ringers  bolus 1,000 mL (1,000 mLs Intravenous New Bag/Given 06/28/24 1952)    _______________________________________________________ ER Provider Called:       DrGOMEZ  They Recommend admit to medicine *** Will see in AM  ***SEEN in ER     ED Triage  Vitals [06/28/24 1737]  Encounter Vitals Group     BP 92/64     Girls Systolic BP Percentile      Girls Diastolic BP Percentile      Boys Systolic BP Percentile      Boys Diastolic BP Percentile      Pulse Rate 62     Resp 12     Temp (!) 97.1 F (36.2 C)     Temp Source Temporal     SpO2 100 %     Weight      Height      Head Circumference      Peak Flow      Pain Score      Pain Loc      Pain Education      Exclude from Growth Chart   TMAX(24)@     _________________________________________ Significant initial  Findings: Abnormal Labs Reviewed  CBC WITH DIFFERENTIAL/PLATELET - Abnormal; Notable for the following components:      Result Value   WBC 14.7 (*)    HCT 52.1 (*)    MCHC 29.9 (*)    Neutro Abs 13.2 (*)    All other components within normal limits  COMPREHENSIVE METABOLIC PANEL WITH GFR - Abnormal; Notable for the following components:   Sodium 161 (*)    Chloride 128 (*)    CO2 20 (*)    Glucose, Bld 464 (*)    BUN 104 (*)    Creatinine, Ser 2.48 (*)    Total Protein 6.2 (*)    Albumin  3.3 (*)    AST 47 (*)    GFR, Estimated 29 (*)    All other components within normal limits  I-STAT CHEM 8, ED - Abnormal; Notable for the following components:   Sodium 164 (*)    Chloride 129 (*)    BUN 101 (*)    Creatinine, Ser 2.50 (*)    Glucose, Bld 445 (*)    Calcium, Ion 1.14 (*)    TCO2 20 (*)    All other components within normal limits  I-STAT CG4 LACTIC ACID, ED - Abnormal; Notable for the following components:   Lactic Acid, Venous 3.7 (*)    All other components within normal limits  I-STAT VENOUS BLOOD GAS, ED - Abnormal; Notable for the following components:   pH, Ven 7.239 (*)    Acid-base deficit 4.0 (*)    Sodium 163 (*)    All other components within normal limits  I-STAT VENOUS BLOOD GAS, ED - Abnormal; Notable for the following components:   pO2, Ven 60 (*)    Acid-base deficit 7.0 (*)    Sodium 163 (*)    All  other components within  normal limits  I-STAT CG4 LACTIC ACID, ED - Abnormal; Notable for the following components:   Lactic Acid, Venous 2.3 (*)    All other components within normal limits      _________________________ Troponin ***ordered Cardiac Panel (last 3 results) No results for input(s): CKTOTAL, CKMB, TROPONINIHS, RELINDX in the last 72 hours.   ECG: Ordered Personally reviewed and interpreted by me showing: HR : *** Rhythm: *NSR, Sinus tachycardia * A.fib. W RVR, RBBB, LBBB, Paced Ischemic changes*nonspecific changes, no evidence of ischemic changes QTC*  BNP (last 3 results) No results for input(s): BNP in the last 8760 hours.    No results for input(s): DDIMER, FERRITIN, LDH, CRP in the last 72 hours.    ____________________ This patient meets SIRS Criteria and may be septic. SIRS = Systemic Inflammatory Response Syndrome  Order a lactic acid level if needed AND/OR Initiate the sepsis protocol with the attached order set OR Click Treating Associated Infection or Illness if the patient is being treated for an infection that is a known cause of these abnormalities     The recent clinical data is shown below. Vitals:   06/28/24 2005 06/28/24 2010 06/28/24 2012 06/28/24 2015  BP: (!) 103/43 (!) 106/55 (!) 93/34 (!) 84/51  Pulse: 64 64 (!) 53 68  Resp: 14 13 13 11   Temp:      TempSrc:      SpO2: 100% 100% 100% 100%        WBC     Component Value Date/Time   WBC 14.7 (H) 06/28/2024 1848   LYMPHSABS 1.1 06/28/2024 1848   LYMPHSABS 1.4 03/20/2024 1526   MONOABS 0.4 06/28/2024 1848   EOSABS 0.0 06/28/2024 1848   EOSABS 0.1 03/20/2024 1526   BASOSABS 0.0 06/28/2024 1848   BASOSABS 0.0 03/20/2024 1526        Lactic Acid, Venous    Component Value Date/Time   LATICACIDVEN 2.3 (HH) 06/28/2024 1934     Lactic Acid, Venous    Component Value Date/Time   LATICACIDVEN 2.3 (HH) 06/28/2024 1934    Procalcitonin *** Ordered      UA *** no evidence  of UTI  ***Pending ***not ordered   Urine analysis: No results found for: COLORURINE, APPEARANCEUR, LABSPEC, PHURINE, GLUCOSEU, HGBUR, BILIRUBINUR, KETONESUR, PROTEINUR, UROBILINOGEN, NITRITE, LEUKOCYTESUR  Results for orders placed or performed during the hospital encounter of 06/28/24  Culture, blood (Routine X 2) w Reflex to ID Panel     Status: None (Preliminary result)   Collection Time: 06/28/24  6:00 PM   Specimen: BLOOD LEFT WRIST  Result Value Ref Range Status   Specimen Description BLOOD LEFT WRIST  Final   Special Requests   Final    BOTTLES DRAWN AEROBIC AND ANAEROBIC Blood Culture results may not be optimal due to an inadequate volume of blood received in culture bottles Performed at [facility], [address] [place], [place] [address]    Culture PENDING  Incomplete   Report Status PENDING  Incomplete    ABX started Antibiotics Given (last 72 hours)     None        No results found for the last 90 days.    ________________________________________________________________  Arterial ***Venous  Blood Gas result:  pH *** pCO2 ***; pO2 ***;     %O2 Sat ***.  ABG    Component Value Date/Time   HCO3 20.4 06/28/2024 1859   TCO2 22 06/28/2024 1859   ACIDBASEDEF 7.0 (H)  06/28/2024 1859   O2SAT 86 06/28/2024 1859       __________________________________________________________ Recent Labs  Lab 06/28/24 1808 06/28/24 1848 06/28/24 1858 06/28/24 1859  NA 163* 161* 164* 163*  K 3.7 4.4 4.3 4.3  CO2  --  20*  --   --   GLUCOSE  --  464* 445*  --   BUN  --  104* 101*  --   CREATININE  --  2.48* 2.50*  --   CALCIUM  --  9.0  --   --     Cr  * stable,  Up from baseline see below Lab Results  Component Value Date   CREATININE 2.50 (H) 06/28/2024   CREATININE 2.48 (H) 06/28/2024   CREATININE 1.22 06/16/2024    Recent Labs  Lab 06/28/24 1848  AST 47*  ALT 42  ALKPHOS 81  BILITOT 0.4  PROT 6.2*  ALBUMIN  3.3*   Lab  Results  Component Value Date   CALCIUM 9.0 06/28/2024          Plt: Lab Results  Component Value Date   PLT 214 06/28/2024         Recent Labs  Lab 06/28/24 1808 06/28/24 1848 06/28/24 1858 06/28/24 1859  WBC  --  14.7*  --   --   NEUTROABS  --  13.2*  --   --   HGB 15.6 15.6 16.7 16.3  HCT 46.0 52.1* 49.0 48.0  MCV  --  94.4  --   --   PLT  --  214  --   --     HG/HCT * stable,  Down *Up from baseline see below    Component Value Date/Time   HGB 16.3 06/28/2024 1859   HGB 15.9 03/20/2024 1526   HCT 48.0 06/28/2024 1859   HCT 49.9 03/20/2024 1526   MCV 94.4 06/28/2024 1848   MCV 89 03/20/2024 1526      No results for input(s): LIPASE, AMYLASE in the last 168 hours. No results for input(s): AMMONIA in the last 168 hours.    _______________________________________________ Hospitalist was called for admission for *** Altered mental status, unspecified altered mental status type ***  Hypernatremia ***  AKI (acute kidney injury) ***    The following Work up has been ordered so far:  Orders Placed This Encounter  Procedures   Culture, blood (Routine X 2) w Reflex to ID Panel   Resp panel by RT-PCR (RSV, Flu A&B, Covid) Anterior Nasal Swab   DG Chest Port 1 View   CT Head Wo Contrast   CBC with Differential/Platelet   Comprehensive metabolic panel with GFR   Urinalysis, w/ Reflex to Culture (Infection Suspected) -Urine, Clean Catch   Beta-hydroxybutyric acid   Check Rectal Temperature   Consult to hospitalist   I-stat chem 8, ed   I-Stat arterial blood gas, ED ([facility], [facility], [facility])   I-Stat venous blood gas, ED   I-Stat venous blood gas, ED   I-Stat CG4 Lactic Acid   EKG 12-Lead   EKG 12-Lead     OTHER Significant initial  Findings:  labs showing:     DM  labs:  HbA1C: Recent Labs    06/15/24 0137  HGBA1C 8.7*       CBG (last 3)  No results for input(s): GLUCAP in the last 72 hours.        Cultures:    Component Value  Date/Time   SDES BLOOD LEFT WRIST 06/28/2024 1800   SPECREQUEST  06/28/2024  1800    BOTTLES DRAWN AEROBIC AND ANAEROBIC Blood Culture results may not be optimal due to an inadequate volume of blood received in culture bottles Performed at [facility], [address], [place], [place] [address]    CULT PENDING 06/28/2024 1800   REPTSTATUS PENDING 06/28/2024 1800     Radiological Exams on Admission: DG Chest Port 1 View Result Date: 06/28/2024 EXAM: 1 VIEW(S) XRAY OF THE CHEST 06/28/2024 06:27:00 PM COMPARISON: None available. CLINICAL HISTORY: Shortness of breath. FINDINGS: LUNGS AND PLEURA: Low lung volumes. No focal pulmonary opacity. No pleural effusion. No pneumothorax. HEART AND MEDIASTINUM: Cardiac silhouette is at the upper limits of normal, likely accentuated by AP technique and low lung volumes. No acute abnormality of the mediastinal silhouette. BONES AND SOFT TISSUES: Patient rotated to the right. Multilevel thoracic osteophytosis. No acute osseous abnormality. IMPRESSION: 1. Low lung volumes. No acute cardiopulmonary abnormality. Electronically signed by: Rogelia Myers MD 06/28/2024 06:32 PM EST RP Workstation: [id number]   _______________________________________________________________________________________________________ Latest  Blood pressure (!) 84/51, pulse 68, temperature (!) 96.5 F (35.8 C), temperature source Rectal, resp. rate 11, SpO2 100%.   Vitals  labs and radiology finding personally reviewed  Review of Systems:    Pertinent positives include: ***  Constitutional:  No weight loss, night sweats, Fevers, chills, fatigue, weight loss  HEENT:  No headaches, Difficulty swallowing,Tooth/dental problems,Sore throat,  No sneezing, itching, ear ache, nasal congestion, post nasal drip,  Cardio-vascular:  No chest pain, Orthopnea, PND, anasarca, dizziness, palpitations.no Bilateral lower extremity swelling  GI:  No heartburn, indigestion, abdominal pain,  nausea, vomiting, diarrhea, change in bowel habits, loss of appetite, melena, blood in stool, hematemesis Resp:  no shortness of breath at rest. No dyspnea on exertion, No excess mucus, no productive cough, No non-productive cough, No coughing up of blood.No change in color of mucus.No wheezing. Skin:  no rash or lesions. No jaundice GU:  no dysuria, change in color of urine, no urgency or frequency. No straining to urinate.  No flank pain.  Musculoskeletal:  No joint pain or no joint swelling. No decreased range of motion. No back pain.  Psych:  No change in mood or affect. No depression or anxiety. No memory loss.  Neuro: no localizing neurological complaints, no tingling, no weakness, no double vision, no gait abnormality, no slurred speech, no confusion  All systems reviewed and apart from HOPI all are negative _______________________________________________________________________________________________ Past Medical History:   Past Medical History:  Diagnosis Date   Autism    Diabetes mellitus without complication (HCC)    Eczema    Non-verbal learning disorder    Urticaria       History reviewed. No pertinent surgical history.  Social History:  Ambulatory *** independently cane, walker  wheelchair bound, bed bound     reports that he has never smoked. He has never used smokeless tobacco. He reports that he does not drink alcohol and does not use drugs.     Family History: *** Family History  Problem Relation Age of Onset   Allergic rhinitis Mother    Asthma Father    Allergic rhinitis Father    Eczema Sister    ______________________________________________________________________________________________ Allergies: Allergies[1]   Prior to Admission medications  Medication Sig Start Date End Date Taking? Authorizing Provider  apixaban  (ELIQUIS ) 5 MG TABS tablet Take 1 tablet (5 mg total) by mouth 2 (two) times daily. 06/19/24   Ghimire, Donalda HERO, MD   cetirizine  (ZYRTEC  ALLERGY ) 10 MG tablet Take 1  tablet (10 mg total) by mouth 2 (two) times daily as needed (itching, allergies). Patient taking differently: Take 10 mg by mouth 3 times/day as needed-between meals & bedtime (itching, allergies). 04/17/24   Luke Orlan HERO, DO  clobetasol  cream (TEMOVATE ) 0.05 % Apply 1 Application topically 2 (two) times daily as needed (breakout). 01/17/24   [provider]  empagliflozin  (JARDIANCE ) 10 MG TABS tablet Take 1 tablet by mouth daily.    [provider]  EPINEPHrine  0.3 mg/0.3 mL IJ SOAJ injection Inject 0.3 mg into the muscle as needed for anaphylaxis. 04/17/24   Luke Orlan HERO, DO  fenofibrate  160 MG tablet Take 160 mg by mouth daily.    [provider]  Fluocinolone  Acetonide Body (DERMA-SMOOTHE /FS BODY) 0.01 % OIL Apply once a day on the whole body. Once rash/itching clears up only use up to twice per week. 04/17/24   Luke Orlan HERO, DO  gabapentin  (NEURONTIN ) 100 MG capsule TK 1 C PO IN MORNING AND 3 CAPSULE HS 11/12/18   [provider]  insulin  aspart (NOVOLOG  FLEXPEN) 100 UNIT/ML FlexPen 0-9 Units, Subcutaneous, 3 times daily with meals CBG < 70: Implement Hypoglycemia measures CBG 70 - 120: 0 units CBG 121 - 150: 1 unit CBG 151 - 200: 2 units CBG 201 - 250: 3 units CBG 251 - 300: 5 units CBG 301 - 350: 7 units CBG 351 - 400: 9 units CBG > 400: call MD 06/19/24   Raenelle Donalda HERO, MD  insulin  glargine (LANTUS ) 100 UNIT/ML injection Inject 0.1 mLs (10 Units total) into the skin at bedtime. 06/19/24   Ghimire, Donalda HERO, MD  metFORMIN  (GLUCOPHAGE -XR) 500 MG 24 hr tablet TK 2 TS PO QAM AND 1 T QPM 11/14/18   [provider]  metoprolol  tartrate (LOPRESSOR ) 25 MG tablet Take 1 tablet (25 mg total) by mouth 2 (two) times daily. 06/19/24   Ghimire, Donalda HERO, MD  NONFORMULARY OR COMPOUNDED ITEM [facility] compound:  Peripheral Neuropathy cream - Bupivacaine 1%, Doxepin 3%, Gabapentin  6%, Pentoxifylline 3%,  Topiramate 1%, dispense 120 grams, apply 1-2 grams to affected area 3-4 times daily, +2refills. 09/06/15   Gershon Donnice SAUNDERS, DPM  olmesartan (BENICAR) 20 MG tablet Take 20 mg by mouth daily.    [provider]  omeprazole (PRILOSEC) 20 MG capsule Take 20 mg by mouth daily.    [provider]  polyethylene glycol (MIRALAX ) 17 g packet Take 17 g by mouth 2 (two) times a week.    [provider]  senna-docusate (SENOKOT-S) 8.6-50 MG tablet Take 1 tablet by mouth at bedtime as needed for moderate constipation. 06/19/24   Ghimire, Donalda HERO, MD  sertraline  (ZOLOFT ) 100 MG tablet Take 100 mg by mouth daily. 05/18/24   [provider]  sitaGLIPtin (JANUVIA) 100 MG tablet Take 1 tablet by mouth daily.    [provider]  triazolam (HALCION) 0.125 MG tablet TAKE 3 TABLETS BY MOUTH PRIOR TO DENTAL PROCEDURE AS DIRECTED BY DENTAL STAFF 03/07/18   [provider]    ___________________________________________________________________________________________________ Physical Exam:    06/28/2024    8:15 PM 06/28/2024    8:12 PM 06/28/2024    8:10 PM  Vitals with BMI  Systolic 84 93 106  Diastolic 51 34 55  Pulse 68 53 64     1. General:  in No ***Acute distress***increased work of breathing ***complaining of severe pain****agitated * Chronically ill *well *cachectic *toxic acutely ill -appearing 2. Psychological: Alert and *** Oriented 3. Head/ENT:  Moist *** Dry Mucous Membranes                          Head Non traumatic, neck supple                          Normal *** Poor Dentition 4. SKIN: normal *** decreased Skin turgor,  Skin clean Dry and intact no rash    5. Heart: Regular rate and rhythm no*** Murmur, no Rub or gallop 6. Lungs: ***Clear to auscultation bilaterally, no wheezes or crackles   7. Abdomen: Soft, ***non-tender, Non distended *** obese ***bowel sounds present 8. Lower extremities: no clubbing, cyanosis, no ***edema 9.  Neurologically Grossly intact, moving all 4 extremities equally *** strength 5 out of 5 in all 4 extremities cranial nerves II through XII intact 10. MSK: Normal range of motion    Chart has been reviewed  ______________________________________________________________________________________________  Assessment/Plan  ***  Admitted for *** Altered mental status, unspecified altered mental status type ***  Hypernatremia ***  AKI (acute kidney injury) ***    Present on Admission: **None**     No problem-specific Assessment & Plan notes found for this encounter.    Other plan as per orders.  DVT prophylaxis:  SCD *** Lovenox       Code Status:    Code Status: Prior FULL CODE *** DNR/DNI ***comfort care as per patient ***family  I had personally discussed CODE STATUS with patient and family*  ACP *** none has been reviewed ***   Family Communication:   Family not at  Bedside  plan of care was discussed on the phone with *** Son, Daughter, Wife, Husband, Sister, Brother , father, mother  Diet    Disposition Plan:   *** likely will need placement for rehabilitation                          Back to current facility when stable                            To home once workup is complete and patient is stable  ***Following barriers for discharge:                             Chest pain *** Stroke *** Syncope ***work up is complete                            Electrolytes corrected                               Anemia corrected h/H stable                             Pain controlled with PO medications                               Afebrile, white count improving able to transition to PO antibiotics                             Will need to be able to tolerate  PO                            Will likely need home health, home O2, set up                           Will need consultants to evaluate patient prior to discharge                           Work of breathing improves        Consult Orders  (From admission, onward)           Start     Ordered   06/28/24 1930  Consult to hospitalist  Paged By Andrea@19 :32 pm  Once       Provider:  (Not yet assigned)  Question Answer Comment  Place call to: [facility]   Reason for Consult Admit      06/28/24 1929                              ***Would benefit from PT/OT eval prior to DC  Ordered                   Swallow eval - SLP ordered                   Diabetes care coordinator                   Transition of care consulted                   Nutrition    consulted                  Wound care  consulted                   Palliative care    consulted                   Behavioral health  consulted                    Consults called: ***  NONE   Admission status:  ED Disposition     ED Disposition  Admit   Condition  --   Comment  The patient appears reasonably stabilized for admission considering the current resources, flow, and capabilities available in the ED at this time, and I doubt any other [facility] requiring further screening and/or treatment in the ED prior to admission is  present.           Obs***  ***  inpatient     I Expect 2 midnight stay secondary to severity of patient's current illness need for inpatient interventions justified by the following: ***hemodynamic instability despite optimal treatment (tachycardia *hypotension * tachypnea *hypoxia, hypercapnia) *** Severe lab/radiological/exam abnormalities including:    Altered mental status, unspecified altered mental status type ***  Hypernatremia ***  AKI (acute kidney injury) ***  and extensive comorbidities including: *substance abuse  *Chronic pain *DM2  * CHF * CAD  * COPD/asthma *Morbid Obesity * CKD *dementia *liver disease *history of stroke with residual deficits *  malignancy, * sickle cell disease  History of amputation Chronic anticoagulation  That are currently affecting medical  management.   I expect  patient to  be hospitalized for 2 midnights requiring inpatient medical care.  Patient is at high risk for adverse outcome (such as loss of life or disability) if not treated.  Indication for inpatient stay as follows:  Severe change from baseline regarding mental status Hemodynamic instability despite maximal medical therapy,  severe pain requiring acute inpatient management,  inability to maintain oral hydration   persistent chest pain despite medical management Need for operative/procedural  intervention New or worsening hypoxia ongoing suicidal ideations   Need for IV antibiotics, IV fluids,, IV pain medications, IV anticoagulation,  IV rate controling medications, IV antihypertensives need for biPAP Frequent labs    Level of care        progressive         Critical***  Patient is critically ill due to  hemodynamic instability * respiratory failure *severe sepsis* ongoing chest pain*  They are at high risk for life/limb threatening clinical deterioration requiring frequent reassessment and modifications of care.  Services provided include examination of the patient, review of relevant ancillary tests, prescription of lifesaving therapies, review of medications and prophylactic therapy.  Total critical care time excluding separately billable procedures: 60*  Minutes.    Rashawna Scoles 06/28/2024, 8:34 PM ***  [facility]     after 2 AM please page floor coverage   If 7AM-7PM, please contact the day team taking care of the patient using [email or web address]        [1]  Allergies Allergen Reactions   Ace Inhibitors     Other reaction(s): cough Other reaction(s): cough   Iodine     Other reaction(s): Unknown Other reaction(s): Unknown   Shellfish Allergy      Other reaction(s): Unknown   Shellfish Protein-Containing Drug Products     Other reaction(s): Unknown Other reaction(s): Unknown   Simvastatin     Other reaction(s): intolerance  to Other reaction(s): intolerance to   "

## 2024-06-28 NOTE — ED Notes (Signed)
 Notified Dr. Dasie of pts current BP. Orders received.

## 2024-06-28 NOTE — ED Notes (Signed)
 I placed a bunch of warm blankets on pt, while I await secretary to order more blankets that goes to [employer].

## 2024-06-28 NOTE — ED Notes (Signed)
 Placed pt on 2L 02 per Joseph and Sa02 100%

## 2024-06-28 NOTE — Progress Notes (Incomplete)
 eLink Physician-Brief Progress Note Patient Name: Eric Cummings DOB: [birth year] MRN: [id number]   Date of Service  06/28/2024  HPI/Events of Note  19 M recent CVA discharged to SNF, afib, DM (A1C 8.7), autism. He was brought back in due to hypotension and hyperglycemia. Na 163, Cl 128, creatinine 2.48. Given 2 liters isotonic bolus and eventually started on norepinephrine . Also placed on 0.45% at 125 cc/hr.  eICU Interventions  Ordered LR 500 cc bolus to facilitate coming down on norepinephrine  Discontinued 0.45% saline at with drop in Na by 7 meqs in 3 hours. Continue to monitor closely. Discussed with BSRN     Intervention Category Intermediate Interventions: Hypotension - evaluation and management Evaluation Type: New Patient Evaluation  Eric Cummings 06/28/2024, 11:53 PM

## 2024-06-28 NOTE — H&P (Addendum)
 "  NAME:  Eric Cummings, MRN:  [id number], DOB:  03/06/1966, LOS: 0 ADMISSION DATE:  06/28/2024, CONSULTATION DATE:  06/28/24 REFERRING MD:  EDP, CHIEF COMPLAINT:  ams/hypernatremia   History of Present Illness:  59 yo male presented from SNF (placed after recent discharge in mid January 2/2 acute cva) 2/2 ams noted by staff and subsequently found to have hypotension. Of note pt is non-verbal at baseline.   Upon presentation pt found to be hypotensive to 70's, hyperglycemia to >500's and hypernatremic, despite hyperglycemia, to 160's. Per family pt had recent fall over the weekend was evaluated in ED for this as pt is on chronic eliquis . Pt was given IVF in ed and pressors stopped. However once fluids stopped pt's BP began to downtrend again and he was started on norepi for which ccm was consulted  Pt has had poor po intake, no reported n/v/d. No fever/chills documented. 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 rate 11, SpO2 100%.        Intake/Output Summary (Last 24 hours) at 06/28/2024 2051 Last data filed at 06/28/2024 1949 Gross per 24 hour  Intake 1300 ml  Output --  Net 1300 ml   There were no vitals filed for this visit.  Examination: General: nad, somnolent, laying completely supine in bed HENT: ncat, perrla, mm dry and pale Lungs: cta  Cardiovascular: brady and irreg Abdomen: soft, nt/nd bs + Extremities: no c/c/e + tenting Neuro: protecting airway , some L weakness at baseline per family, not following commands. somnolent GU:  deferred  Resolved problem list   Assessment and Plan  Acute metabolic encephalopathy Hypernatremia Dehydration Lactic acidosis Aki T2dm with hyperglycemia Hypotension with h/o hypertension Prolonged qt H/o autism H/o afib on chronic a/c H/o cva on chronic a/c -flu/covid pending -monitor mental status, baseline however is non verbal as well -continue with ivf for rehydration -follow urine studies/renal studies/indices and uop -avoid overcorrection in first 24 hours of sodium -q4hr checks of sodium -check cortisol, low threshold to start stress dose steroids -recent echo normal lvef 55-60% without diastolic or rv dysfunction -check thyroid  studies as well -lactate improving with volume -f/u cx, empiric abx for now, de-escalate as able -ssi -check mag -Holding home anti-htn agents -hold oral agents until mentation improves. If unable to take po in am will need to start systemic a/c as holding oral eliquis    Labs   CBC: Recent Labs  Lab 06/28/24 1808 06/28/24 1848 06/28/24 1858 06/28/24 1859  WBC  --  14.7*  --   --   NEUTROABS  --  13.2*  --   --   HGB 15.6 15.6 16.7 16.3  HCT 46.0 52.1* 49.0 48.0  MCV  --  94.4  --   --   PLT  --  214  --   --     Basic Metabolic Panel: Recent Labs  Lab 06/28/24 1808 06/28/24 1848 06/28/24 1858 06/28/24 1859  NA 163* 161* 164* 163*  K 3.7 4.4 4.3 4.3  CL  --  128* 129*  --  CO2  --  20*  --   --   GLUCOSE  --  464* 445*  --   BUN  --  104* 101*  --   CREATININE  --  2.48* 2.50*  --   CALCIUM  --  9.0  --   --    GFR: Estimated Creatinine Clearance: 33.3 mL/min (A) (by C-G formula based on SCr of 2.5 mg/dL (H)). Recent Labs  Lab 06/28/24 1848 06/28/24 1859 06/28/24 1934  WBC 14.7*  --   --   LATICACIDVEN  --  3.7* 2.3*    Liver Function Tests: Recent Labs  Lab 06/28/24 1848  AST 47*  ALT 42  ALKPHOS 81  BILITOT 0.4  PROT 6.2*  ALBUMIN  3.3*   No results for input(s): LIPASE, AMYLASE in the last  168 hours. No results for input(s): AMMONIA in the last 168 hours.  ABG    Component Value Date/Time   HCO3 20.4 06/28/2024 1859   TCO2 22 06/28/2024 1859   ACIDBASEDEF 7.0 (H) 06/28/2024 1859   O2SAT 86 06/28/2024 1859     Coagulation Profile: No results for input(s): INR, PROTIME in the last 168 hours.  Cardiac Enzymes: No results for input(s): CKTOTAL, CKMB, CKMBINDEX, TROPONINI in the last 168 hours.  HbA1C: Hgb A1c MFr Bld  Date/Time Value Ref Range Status  06/15/2024 01:37 AM 8.7 (H) 4.8 - 5.6 % Final    Comment:    (NOTE) Diagnosis of Diabetes The following HbA1c ranges recommended by the American Diabetes Association (ADA) may be used as an aid in the diagnosis of diabetes mellitus.  Hemoglobin             Suggested A1C NGSP%              Diagnosis  <5.7                   Non Diabetic  5.7-6.4                Pre-Diabetic  >6.4                   Diabetic  <7.0                   Glycemic control for                       adults with diabetes.      CBG: No results for input(s): GLUCAP in the last 168 hours.  Review of Systems:   Unobtainable 2/2 pt's baseline mental status and concomitant acute metabolic encephalopathy   Past Medical History:  He,  has a past medical history of Autism, Diabetes mellitus without complication (HCC), Eczema, Non-verbal learning disorder, and Urticaria.   Surgical History:  History reviewed. No pertinent surgical history.   Social History:   reports that he has never smoked. He has never used smokeless tobacco. He reports that he does not drink alcohol and does not use drugs.   Family History:  His family history includes Allergic rhinitis in his father and mother; Asthma in his father; Eczema in his sister.   Allergies Allergies[1]   Home Medications  Prior to Admission medications  Medication Sig Start Date End Date Taking? Authorizing Provider  apixaban  (ELIQUIS ) 5 MG TABS tablet Take 1 tablet (5  mg total) by mouth 2 (two) times daily. 06/19/24   Ghimire, Donalda HERO, MD  cetirizine  (ZYRTEC  ALLERGY ) 10 MG tablet Take 1 tablet (  10 mg total) by mouth 2 (two) times daily as needed (itching, allergies). Patient taking differently: Take 10 mg by mouth 3 times/day as needed-between meals & bedtime (itching, allergies). 04/17/24   Luke Orlan HERO, DO  clobetasol  cream (TEMOVATE ) 0.05 % Apply 1 Application topically 2 (two) times daily as needed (breakout). 01/17/24   [provider]  empagliflozin  (JARDIANCE ) 10 MG TABS tablet Take 1 tablet by mouth daily.    [provider]  EPINEPHrine  0.3 mg/0.3 mL IJ SOAJ injection Inject 0.3 mg into the muscle as needed for anaphylaxis. 04/17/24   Luke Orlan HERO, DO  fenofibrate  160 MG tablet Take 160 mg by mouth daily.    [provider]  Fluocinolone  Acetonide Body (DERMA-SMOOTHE /FS BODY) 0.01 % OIL Apply once a day on the whole body. Once rash/itching clears up only use up to twice per week. 04/17/24   Luke Orlan HERO, DO  gabapentin  (NEURONTIN ) 100 MG capsule TK 1 C PO IN MORNING AND 3 CAPSULE HS 11/12/18   [provider]  insulin  aspart (NOVOLOG  FLEXPEN) 100 UNIT/ML FlexPen 0-9 Units, Subcutaneous, 3 times daily with meals CBG < 70: Implement Hypoglycemia measures CBG 70 - 120: 0 units CBG 121 - 150: 1 unit CBG 151 - 200: 2 units CBG 201 - 250: 3 units CBG 251 - 300: 5 units CBG 301 - 350: 7 units CBG 351 - 400: 9 units CBG > 400: call MD 06/19/24   Raenelle Donalda HERO, MD  insulin  glargine (LANTUS ) 100 UNIT/ML injection Inject 0.1 mLs (10 Units total) into the skin at bedtime. 06/19/24   Ghimire, Donalda HERO, MD  metFORMIN  (GLUCOPHAGE -XR) 500 MG 24 hr tablet TK 2 TS PO QAM AND 1 T QPM 11/14/18   [provider]  metoprolol  tartrate (LOPRESSOR ) 25 MG tablet Take 1 tablet (25 mg total) by mouth 2 (two) times daily. 06/19/24   Ghimire, Donalda HERO, MD  NONFORMULARY OR COMPOUNDED ITEM [facility] compound:  Peripheral Neuropathy cream  - Bupivacaine 1%, Doxepin 3%, Gabapentin  6%, Pentoxifylline 3%, Topiramate 1%, dispense 120 grams, apply 1-2 grams to affected area 3-4 times daily, +2refills. 09/06/15   Gershon Donnice SAUNDERS, DPM  olmesartan (BENICAR) 20 MG tablet Take 20 mg by mouth daily.    [provider]  omeprazole (PRILOSEC) 20 MG capsule Take 20 mg by mouth daily.    [provider]  polyethylene glycol (MIRALAX ) 17 g packet Take 17 g by mouth 2 (two) times a week.    [provider]  senna-docusate (SENOKOT-S) 8.6-50 MG tablet Take 1 tablet by mouth at bedtime as needed for moderate constipation. 06/19/24   Ghimire, Donalda HERO, MD  sertraline  (ZOLOFT ) 100 MG tablet Take 100 mg by mouth daily. 05/18/24   [provider]  sitaGLIPtin (JANUVIA) 100 MG tablet Take 1 tablet by mouth daily.    [provider]  triazolam (HALCION) 0.125 MG tablet TAKE 3 TABLETS BY MOUTH PRIOR TO DENTAL PROCEDURE AS DIRECTED BY DENTAL STAFF 03/07/18   [provider]     Critical care time:      Critical care time: The patient is critically ill with multiple organ systems failure and requires high complexity decision making for assessment and support, frequent evaluation and titration of therapies, application of advanced monitoring technologies and extensive interpretation of multiple databases.  Critical care time 43 mins. This represents my time independent of the NPs time taking care of the pt. This is excluding procedures.            [  1]  Allergies Allergen Reactions   Ace Inhibitors     Other reaction(s): cough Other reaction(s): cough   Iodine     Other reaction(s): Unknown Other reaction(s): Unknown   Shellfish Allergy      Other reaction(s): Unknown   Shellfish Protein-Containing Drug Products     Other reaction(s): Unknown Other reaction(s): Unknown   Simvastatin     Other reaction(s): intolerance to Other reaction(s): intolerance to   "

## 2024-06-28 NOTE — ED Notes (Signed)
 EDP made aware of pt current BP and medication level.

## 2024-06-28 NOTE — Subjective & Objective (Signed)
 Patient comes from [name] for hypotension and change in mental status patient is nonverbal at baseline has been noted to have hyperglycemia at the facility up to 550 hypotensive down to 76/52 EMS gave 500 mL normal saline and epinephrine  Patient arrived to [facility], ER on nonrebreather Apparently per history patient have had a stroke recently He is on Eliquis  While in the emergency department after IV fluid rehydration his epinephrine  drip was able to be discontinued.  His O2 was able to be weaned down to 2 L and he is satting 100%

## 2024-06-28 NOTE — ED Triage Notes (Addendum)
 Pt arrived via [facility] from [facility] for hypotension and AMS. Pt is non-verbal at baseline. Per EMS, pt glucose has been high all day 550 for the facility. Per EMS, initial bp 76/52. EMS gave NS 500 ml and epinephrine  25 mL.  Pt arrived on NRB, 15 mL

## 2024-06-28 NOTE — ED Notes (Signed)
 I notified Dr Dasie of pt's hypotension and obtained orders for a bolus of LR

## 2024-06-28 NOTE — ED Notes (Signed)
 Critical care at bedside

## 2024-06-28 NOTE — Progress Notes (Addendum)
 eLink Physician-Brief Progress Note Patient Name: Eric Cummings DOB: 1966/02/27 MRN: [id number]   Date of Service  06/28/2024  HPI/Events of Note  12 M recent CVA discharged to SNF, afib, DM (A1C 8.7), autism. He was brought back in due to hypotension and hyperglycemia. Na 163, Cl 128, creatinine 2.48. Given 2 liters isotonic bolus and eventually started on norepinephrine . Also placed on 0.45% at 125 cc/hr.  eICU Interventions  Ordered LR 500 cc bolus to facilitate coming down on norepinephrine  Discontinued 0.45% saline as with drop in Na by 9 meqs in 3 hours (corrected sodium of 169 to 160) and shifted IVF to LR. Continue to monitor Na closely. Discussed with BSRN     Intervention Category Intermediate Interventions: Hypotension - evaluation and management Evaluation Type: New Patient Evaluation  Damien ONEIDA Grout 06/28/2024, 11:53 PM

## 2024-06-28 NOTE — Progress Notes (Signed)
 Multiple attempts made to obtain ABG by 2 RRTs. VBG was the only blood able to be collected. Results in chart.

## 2024-06-28 NOTE — ED Provider Notes (Addendum)
 " [place] EMERGENCY DEPARTMENT AT [facility] Provider Note   CSN: [id number] Arrival date & time: 06/28/24  1731     Patient presents with: No chief complaint on file.   Eric Cummings is a 59 y.o. male.   59 year old male with history of diabetes, autism who is at baseline nonverbal presents due to altered mental status x 1 day.  Patient poorly has been hyperglycemic and blood sugars in the 500s.  No reported history of vomiting or diarrhea according to EMS.  When EMS arrived, initial blood pressure was 76/52.  Given half liter of saline and started on epinephrine  drip.  Placed on nonrebreather.  Blood glucose for them was in the 400s.  No further history obtainable due to his current state       Prior to Admission medications  Medication Sig Start Date End Date Taking? Authorizing Provider  apixaban  (ELIQUIS ) 5 MG TABS tablet Take 1 tablet (5 mg total) by mouth 2 (two) times daily. 06/19/24   Ghimire, Donalda HERO, MD  cetirizine  (ZYRTEC  ALLERGY ) 10 MG tablet Take 1 tablet (10 mg total) by mouth 2 (two) times daily as needed (itching, allergies). Patient taking differently: Take 10 mg by mouth 3 times/day as needed-between meals & bedtime (itching, allergies). 04/17/24   Luke Orlan HERO, DO  clobetasol  cream (TEMOVATE ) 0.05 % Apply 1 Application topically 2 (two) times daily as needed (breakout). 01/17/24   [provider]  empagliflozin  (JARDIANCE ) 10 MG TABS tablet Take 1 tablet by mouth daily.    [provider]  EPINEPHrine  0.3 mg/0.3 mL IJ SOAJ injection Inject 0.3 mg into the muscle as needed for anaphylaxis. 04/17/24   Luke Orlan HERO, DO  fenofibrate  160 MG tablet Take 160 mg by mouth daily.    [provider]  Fluocinolone  Acetonide Body (DERMA-SMOOTHE /FS BODY) 0.01 % OIL Apply once a day on the whole body. Once rash/itching clears up only use up to twice per week. 04/17/24   Luke Orlan HERO, DO  gabapentin  (NEURONTIN ) 100 MG capsule TK 1 C PO IN  MORNING AND 3 CAPSULE HS 11/12/18   [provider]  insulin  aspart (NOVOLOG  FLEXPEN) 100 UNIT/ML FlexPen 0-9 Units, Subcutaneous, 3 times daily with meals CBG < 70: Implement Hypoglycemia measures CBG 70 - 120: 0 units CBG 121 - 150: 1 unit CBG 151 - 200: 2 units CBG 201 - 250: 3 units CBG 251 - 300: 5 units CBG 301 - 350: 7 units CBG 351 - 400: 9 units CBG > 400: call MD 06/19/24   Raenelle Donalda HERO, MD  insulin  glargine (LANTUS ) 100 UNIT/ML injection Inject 0.1 mLs (10 Units total) into the skin at bedtime. 06/19/24   Ghimire, Donalda HERO, MD  metFORMIN  (GLUCOPHAGE -XR) 500 MG 24 hr tablet TK 2 TS PO QAM AND 1 T QPM 11/14/18   [provider]  metoprolol  tartrate (LOPRESSOR ) 25 MG tablet Take 1 tablet (25 mg total) by mouth 2 (two) times daily. 06/19/24   Ghimire, Donalda HERO, MD  NONFORMULARY OR COMPOUNDED ITEM [facility] compound:  Peripheral Neuropathy cream - Bupivacaine 1%, Doxepin 3%, Gabapentin  6%, Pentoxifylline 3%, Topiramate 1%, dispense 120 grams, apply 1-2 grams to affected area 3-4 times daily, +2refills. 09/06/15   Gershon Donnice SAUNDERS, DPM  olmesartan (BENICAR) 20 MG tablet Take 20 mg by mouth daily.    [provider]  omeprazole (PRILOSEC) 20 MG capsule Take 20 mg by mouth daily.    [provider]  polyethylene glycol (  MIRALAX ) 17 g packet Take 17 g by mouth 2 (two) times a week.    [provider]  senna-docusate (SENOKOT-S) 8.6-50 MG tablet Take 1 tablet by mouth at bedtime as needed for moderate constipation. 06/19/24   Ghimire, Donalda HERO, MD  sertraline  (ZOLOFT ) 100 MG tablet Take 100 mg by mouth daily. 05/18/24   [provider]  sitaGLIPtin (JANUVIA) 100 MG tablet Take 1 tablet by mouth daily.    [provider]  triazolam (HALCION) 0.125 MG tablet TAKE 3 TABLETS BY MOUTH PRIOR TO DENTAL PROCEDURE AS DIRECTED BY DENTAL STAFF 03/07/18   [provider]    Allergies: Ace inhibitors, Iodine, Shellfish allergy ,  Shellfish protein-containing drug products, and Simvastatin    Review of Systems  Unable to perform ROS: Acuity of condition    Updated Vital Signs There were no vitals taken for this visit.  Physical Exam Vitals and nursing note reviewed.  Constitutional:      General: He is not in acute distress.    Appearance: Normal appearance. He is well-developed. He is not toxic-appearing.  HENT:     Head: Normocephalic and atraumatic.  Eyes:     General: Lids are normal.     Conjunctiva/sclera: Conjunctivae normal.     Pupils: Pupils are equal, round, and reactive to light.  Neck:     Thyroid : No thyroid  mass.     Trachea: No tracheal deviation.  Cardiovascular:     Rate and Rhythm: Normal rate and regular rhythm.     Heart sounds: Normal heart sounds. No murmur heard.    No gallop.  Pulmonary:     Effort: Pulmonary effort is normal. No respiratory distress.     Breath sounds: Normal breath sounds. No stridor. No decreased breath sounds, wheezing, rhonchi or rales.  Abdominal:     General: There is no distension.     Palpations: Abdomen is soft.     Tenderness: There is no abdominal tenderness. There is no rebound.  Musculoskeletal:        General: No tenderness. Normal range of motion.     Cervical back: Normal range of motion and neck supple.  Skin:    General: Skin is warm and dry.     Findings: No abrasion or rash.  Neurological:     Mental Status: He is lethargic and disoriented.     Motor: No tremor.     Comments: Withdraws to pain in extremities.  Psychiatric:        Attention and Perception: He is inattentive.     (all labs ordered are listed, but only abnormal results are displayed) Labs Reviewed  CULTURE, BLOOD (ROUTINE X 2)  CULTURE, BLOOD (ROUTINE X 2)  RESP PANEL BY RT-PCR (RSV, FLU A&B, COVID)  RVPGX2  CBC WITH DIFFERENTIAL/PLATELET  COMPREHENSIVE METABOLIC PANEL WITH GFR  URINALYSIS, W/ REFLEX TO CULTURE (INFECTION SUSPECTED)  BETA-HYDROXYBUTYRIC ACID   I-STAT CHEM 8, ED  I-STAT CG4 LACTIC ACID, ED  I-STAT ARTERIAL BLOOD GAS, ED    EKG: EKG Interpretation Date/Time:  Wednesday June 28 2024 17:37:09 EST Ventricular Rate:  61 PR Interval:  165 QRS Duration:  82 QT Interval:  546 QTC Calculation: 551 R Axis:   62  Text Interpretation: Sinus rhythm Supraventricular bigeminy Abnormal R-wave progression, early transition Borderline abnrm T, anterolateral leads Prolonged QT interval Confirmed by Dasie Faden ([id number]) on 06/28/2024 7:31:01 PM  Radiology: No results found.   Procedures   Medications Ordered in the ED  sodium chloride  0.9 %  bolus 1,000 mL (has no administration in time range)  0.9 %  sodium chloride  infusion (has no administration in time range)                                    Medical Decision Making Amount and/or Complexity of Data Reviewed Labs: ordered. Radiology: ordered. ECG/medicine tests: ordered.  Risk Prescription drug management. Decision regarding hospitalization.   5:49 PM Family at bedside and states that patient did have a fall this past weekend.  He was seen and evaluated for that.  He does take Eliquis .  They note that there is no status changes been acute over the last several hours.  He has had a stroke recently which is why he is on Eliquis .  They state that he is a full code at this time  7:31 PM Patient is EKG shows prolonged QT and he is in sinus rhythm.  Electrolytes significant for acute kidney injury along with hyponatremia.  Patient given IV fluids.  Blood pressure has improved.  He had been on epinephrine  drip when he first presented but that was taken off shortly afterwards.  Patient will require admission and will consult hospitalist team  8:45 PM Despite receiving close 3 L of fluid patient became hypotensive again down to the 70s.  Patient started on nor epi drip.  Discussed with critical care who will see patient  CRITICAL CARE Performed by: Curtistine ONEIDA Dawn Total  critical care time: 75 minutes Critical care time was exclusive of separately billable procedures and treating other patients. Critical care was necessary to treat or prevent imminent or life-threatening deterioration. Critical care was time spent personally by me on the following activities: development of treatment plan with patient and/or surrogate as well as nursing, discussions with consultants, evaluation of patient's response to treatment, examination of patient, obtaining history from patient or surrogate, ordering and performing treatments and interventions, ordering and review of laboratory studies, ordering and review of radiographic studies, pulse oximetry and re-evaluation of patient's condition.     Final diagnoses:  None    ED Discharge Orders     None          Dawn Curtistine, MD 06/28/24 1931    Dawn Curtistine, MD 06/28/24 2045  "

## 2024-06-28 NOTE — ED Notes (Signed)
 I was unsuccessful with getting pt's blood. Phlebotomy is at bedside attempting to get pt's blood

## 2024-06-28 NOTE — ED Notes (Signed)
Pt was incontinent of urine. I cleaned pt and applied a clean brief.

## 2024-06-29 ENCOUNTER — Inpatient Hospital Stay (HOSPITAL_COMMUNITY)

## 2024-06-29 DIAGNOSIS — E870 Hyperosmolality and hypernatremia: Secondary | ICD-10-CM | POA: Diagnosis not present

## 2024-06-29 DIAGNOSIS — E8729 Other acidosis: Secondary | ICD-10-CM

## 2024-06-29 DIAGNOSIS — E860 Dehydration: Secondary | ICD-10-CM | POA: Diagnosis not present

## 2024-06-29 DIAGNOSIS — N179 Acute kidney failure, unspecified: Secondary | ICD-10-CM | POA: Diagnosis not present

## 2024-06-29 DIAGNOSIS — E11649 Type 2 diabetes mellitus with hypoglycemia without coma: Secondary | ICD-10-CM | POA: Diagnosis not present

## 2024-06-29 DIAGNOSIS — G9341 Metabolic encephalopathy: Secondary | ICD-10-CM

## 2024-06-29 LAB — SODIUM
Sodium: 163 mmol/L (ref 135–145)
Sodium: 164 mmol/L (ref 135–145)
Sodium: 158 mmol/L — ABNORMAL HIGH (ref 135–145)

## 2024-06-29 LAB — GLUCOSE, CAPILLARY
Glucose-Capillary: 323 mg/dL — ABNORMAL HIGH (ref 70–99)
Glucose-Capillary: 146 mg/dL — ABNORMAL HIGH (ref 70–99)
Glucose-Capillary: 161 mg/dL — ABNORMAL HIGH (ref 70–99)
Glucose-Capillary: 134 mg/dL — ABNORMAL HIGH (ref 70–99)
Glucose-Capillary: 122 mg/dL — ABNORMAL HIGH (ref 70–99)
Glucose-Capillary: 93 mg/dL (ref 70–99)
Glucose-Capillary: 75 mg/dL (ref 70–99)
Glucose-Capillary: 97 mg/dL (ref 70–99)
Glucose-Capillary: 101 mg/dL — ABNORMAL HIGH (ref 70–99)
Glucose-Capillary: 60 mg/dL — ABNORMAL LOW (ref 70–99)
Glucose-Capillary: 77 mg/dL (ref 70–99)
Glucose-Capillary: 93 mg/dL (ref 70–99)
Glucose-Capillary: 148 mg/dL — ABNORMAL HIGH (ref 70–99)
Glucose-Capillary: 231 mg/dL — ABNORMAL HIGH (ref 70–99)
Glucose-Capillary: 209 mg/dL — ABNORMAL HIGH (ref 70–99)
Glucose-Capillary: 236 mg/dL — ABNORMAL HIGH (ref 70–99)

## 2024-06-29 LAB — CBC
HCT: 51.3 % (ref 39.0–52.0)
Hemoglobin: 15.5 g/dL (ref 13.0–17.0)
MCH: 27.9 pg (ref 26.0–34.0)
MCHC: 30.2 g/dL (ref 30.0–36.0)
MCV: 92.3 fL (ref 80.0–100.0)
Platelets: 192 10*3/uL (ref 150–400)
RBC: 5.56 MIL/uL (ref 4.22–5.81)
RDW: 13.7 % (ref 11.5–15.5)
WBC: 18.8 10*3/uL — ABNORMAL HIGH (ref 4.0–10.5)
nRBC: 0 % (ref 0.0–0.2)

## 2024-06-29 LAB — LACTIC ACID, PLASMA
Lactic Acid, Venous: 4 mmol/L (ref 0.5–1.9)
Lactic Acid, Venous: 3.5 mmol/L (ref 0.5–1.9)

## 2024-06-29 LAB — BASIC METABOLIC PANEL WITH GFR
Anion gap: 13 (ref 5–15)
BUN: 90 mg/dL — ABNORMAL HIGH (ref 6–20)
CO2: 22 mmol/L (ref 22–32)
Calcium: 8.8 mg/dL — ABNORMAL LOW (ref 8.9–10.3)
Chloride: 128 mmol/L — ABNORMAL HIGH (ref 98–111)
Creatinine, Ser: 2.12 mg/dL — ABNORMAL HIGH (ref 0.61–1.24)
GFR, Estimated: 35 mL/min — ABNORMAL LOW
Glucose, Bld: 374 mg/dL — ABNORMAL HIGH (ref 70–99)
Potassium: 5.1 mmol/L (ref 3.5–5.1)
Sodium: 162 mmol/L (ref 135–145)

## 2024-06-29 LAB — MAGNESIUM: Magnesium: 2.6 mg/dL — ABNORMAL HIGH (ref 1.7–2.4)

## 2024-06-29 MED ORDER — INSULIN REGULAR(HUMAN) IN NACL 100-0.9 UT/100ML-% IV SOLN
INTRAVENOUS | Status: DC
  Administered 2024-06-29: 2.4 [IU]/h via INTRAVENOUS
  Filled 2024-06-29: qty 100

## 2024-06-29 MED ORDER — GLUCERNA SHAKE PO LIQD
237.0000 mL | Freq: Three times a day (TID) | ORAL | Status: DC
  Administered 2024-06-29 – 2024-07-03 (×11): 237 mL via ORAL

## 2024-06-29 MED ORDER — DEXTROSE 5 % IV SOLN: INTRAVENOUS | Status: AC

## 2024-06-29 MED ORDER — AMIODARONE HCL IN DEXTROSE 360-4.14 MG/200ML-% IV SOLN
60.0000 mg/h | INTRAVENOUS | Status: AC
  Administered 2024-06-29 (×2): 60 mg/h via INTRAVENOUS
  Filled 2024-06-29: qty 200

## 2024-06-29 MED ORDER — LACTATED RINGERS IV SOLN: INTRAVENOUS | Status: DC

## 2024-06-29 MED ORDER — ORAL CARE MOUTH RINSE
15.0000 mL | OROMUCOSAL | Status: DC
  Administered 2024-06-29 – 2024-07-03 (×16): 15 mL via OROMUCOSAL

## 2024-06-29 MED ORDER — LACTATED RINGERS IV BOLUS
500.0000 mL | Freq: Once | INTRAVENOUS | Status: AC
  Administered 2024-06-29: 500 mL via INTRAVENOUS

## 2024-06-29 MED ORDER — DEXTROSE 50 % IV SOLN: 0.0000 mL | INTRAVENOUS | Status: DC | PRN

## 2024-06-29 MED ORDER — AMIODARONE HCL IN DEXTROSE 360-4.14 MG/200ML-% IV SOLN
30.0000 mg/h | INTRAVENOUS | Status: DC
  Administered 2024-06-29 – 2024-06-30 (×2): 30 mg/h via INTRAVENOUS
  Filled 2024-06-29 (×2): qty 200

## 2024-06-29 MED ORDER — APIXABAN 5 MG PO TABS
5.0000 mg | ORAL_TABLET | Freq: Two times a day (BID) | ORAL | Status: DC
  Administered 2024-06-29 – 2024-07-03 (×8): 5 mg via ORAL
  Filled 2024-06-29 (×8): qty 1

## 2024-06-29 MED ORDER — ORAL CARE MOUTH RINSE: 15.0000 mL | OROMUCOSAL | Status: DC | PRN

## 2024-06-29 NOTE — Progress Notes (Signed)
 Notified Elink, June  (Iris, RN,) of CBG 231 to see if Dr Joelyn now wants to add insulin  coverage or decrease or DC IVF, D5 @ 100 cc/hr currently.

## 2024-06-29 NOTE — Progress Notes (Signed)
 eLink Physician-Brief Progress Note Patient Name: Eric Cummings DOB: 13-May-1966 MRN: [id number]   Date of Service  06/29/2024  HPI/Events of Note  Corrected sodium went down from 169 to 166 in approximately 8 hours  eICU Interventions  Continue LR at 125 cc for now and continue monitoring Na closely     Intervention Category Intermediate Interventions: Electrolyte abnormality - evaluation and management  Damien ONEIDA Grout 06/29/2024, 3:43 AM

## 2024-06-29 NOTE — Progress Notes (Signed)
" ° °  NAME:  Eric Cummings, MRN:  [id number], DOB:  04-07-1966, LOS: 1 ADMISSION DATE:  06/28/2024, CONSULTATION DATE:  06/28/24 REFERRING MD:  EDP, CHIEF COMPLAINT:  ams/hypernatremia   History of Present Illness:  59 yo male presented from SNF (placed after recent discharge in mid January 2/2 acute cva) 2/2 ams noted by staff and subsequently found to have hypotension. Of note pt is non-verbal at baseline.   Upon presentation pt found to be hypotensive to 70's, hyperglycemia to >500's and hypernatremic, despite hyperglycemia, to 160's. Per family pt had recent fall over the weekend was evaluated in ED for this as pt is on chronic eliquis . Pt was given IVF in ed and pressors stopped. However once fluids stopped pt's BP began to downtrend again and he was started on norepi for which ccm was consulted  Pt has had poor po intake, no reported n/v/d. No fever/chills documented. 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  Objective    Blood pressure (!) 107/54, pulse (!) 58, temperature 98.7 F (37.1 C), temperature source Axillary, resp. rate 15, weight 70.6 kg, SpO2 100%.        Intake/Output Summary (Last 24 hours) at 06/29/2024 1531 Last data filed at 06/29/2024 1500 Gross per 24 hour  Intake 6475.59 ml  Output 2050 ml  Net 4425.59 ml   Filed Weights   06/29/24 0500  Weight: 70.6 kg    Examination: No distress chewing on hand mitts Nonverbal Nonlabored breathing Abd soft Moving everything sometimes to command Tracks  Sugars now better Sodium improving  Resolved problem list   Assessment and Plan   Acute metabolic encephalopathy secondary to hypovolemic hypernatremia- 10L free water deficit Dehydration Lactic acidosis Aki T2dm with now hypoglycemia Hypotension with h/o hypertension Prolonged qt H/o autism H/o afib on chronic a/c H/o cva on chronic a/c  Switch to SSI D5W@ 100/hr Swallow screen then hopefully can resume eliquis  and enterally replete water Aiming for sodium high 150s or low 160s by tomorrow am, adjust water PRN Keeping one more day in unit Brother anthony at bedside, updated, we did discuss code status and he wants patient to be DNR which is reasonable but I think/hope acutely he will do fine through this hospitalization May need to consider a different disposition, I am worried how he got this dehydrated at his SNF  Rolan Sharps MD PCCM "

## 2024-06-29 NOTE — Progress Notes (Signed)
 eLink Physician-Brief Progress Note Patient Name: Eric Cummings DOB: 1965/12/13 MRN: [id number]   Date of Service  06/29/2024  HPI/Events of Note  Nurse did a bladder scan and he's got 415cc He's only put out 25cc since arriving to the floor  eICU Interventions  Ordered in and out cath Monitor response to fluid bolus     Intervention Category Intermediate Interventions: Oliguria - evaluation and management  Damien ONEIDA Grout 06/29/2024, 12:28 AM

## 2024-06-29 NOTE — Inpatient Diabetes Management (Addendum)
 Inpatient Diabetes Program Recommendations  AACE/ADA: New Consensus Statement on Inpatient Glycemic Control   Target Ranges:  Prepandial:   less than 140 mg/dL      Peak postprandial:   less than 180 mg/dL (1-2 hours)      Critically ill patients:  140 - 180 mg/dL   Lab Results  Component Value Date   GLUCAP 93 06/29/2024   HGBA1C 8.7 (H) 06/15/2024    Latest Reference Range & Units 06/29/24 03:17 06/29/24 07:40 06/29/24 07:43 06/29/24 08:52 06/29/24 10:05 06/29/24 11:17  Glucose-Capillary 70 - 99 mg/dL 676 (H) 838 (H) 853 (H) 134 (H) 122 (H) 93    Latest Reference Range & Units 06/28/24 21:54 06/29/24 02:43  Potassium 3.5 - 5.1 mmol/L 5.1 5.1  Chloride 98 - 111 mmol/L 126 (H) 128 (H)  CO2 22 - 32 mmol/L 18 (L) 22  Glucose 70 - 99 mg/dL 594 (H) 625 (H)  BUN 6 - 20 mg/dL 94 (H) 90 (H)  Creatinine 0.61 - 1.24 mg/dL 7.81 (H) 7.87 (H)  Calcium 8.9 - 10.3 mg/dL 8.2 (L) 8.8 (L)  Anion gap 5 - 15  12 13     Latest Reference Range & Units 06/28/24 18:59 06/28/24 19:34 06/28/24 22:03 06/29/24 07:37  Lactic Acid, Venous 0.5 - 1.9 mmol/L 3.7 (HH) 2.3 (HH) 4.0 (HH) 4.0 (HH)    Latest Reference Range & Units 06/28/24 18:49 06/28/24 18:58  Beta-Hydroxybutyric Acid 0.05 - 0.27 mmol/L 0.17   Glucose 70 - 99 mg/dL  554 (H)   Review of Glycemic Control  Diabetes history: DM2  Outpatient Diabetes medications:  Jardiance  10mg  daily Januvia 100mg  daily Metformin  1,000mg  BID Glargine 14 units daily Novolog  0-9 units TID   Current orders for Inpatient glycemic control:  IV Insulin    Inpatient Diabetes Program Recommendations:   When transitioning off IV Insulin  is appropriate, please consider:  - Lantus  10 units daily (administer 2 hours prior to discontinuing of IV Insulin )  - Novolog  0-6 units Q4HRS   Thanks,  Lavanda Search, RN, MSN, [facility]  Inpatient Diabetes Coordinator  Pager [phone] (8a-5p)

## 2024-06-29 NOTE — Progress Notes (Signed)
 eLink Physician-Brief Progress Note Patient Name: Eric Cummings DOB: Dec 13, 1965 MRN: [id number]   Date of Service  06/29/2024  HPI/Events of Note  Notified patient on Levo 20 mcg  Discussed with [facility] and she is able to titrated down further to 18 mcg UO 800-900 cc since admission, freely voided  eICU Interventions  Ordered another 500 cc LR bolus     Intervention Category Intermediate Interventions: Hypotension - evaluation and management  Damien ONEIDA Grout 06/29/2024, 2:34 AM

## 2024-06-29 NOTE — Plan of Care (Signed)

## 2024-06-30 DIAGNOSIS — F840 Autistic disorder: Secondary | ICD-10-CM | POA: Diagnosis not present

## 2024-06-30 DIAGNOSIS — Z7901 Long term (current) use of anticoagulants: Secondary | ICD-10-CM

## 2024-06-30 DIAGNOSIS — I4891 Unspecified atrial fibrillation: Secondary | ICD-10-CM | POA: Diagnosis not present

## 2024-06-30 DIAGNOSIS — E872 Acidosis, unspecified: Secondary | ICD-10-CM | POA: Diagnosis not present

## 2024-06-30 DIAGNOSIS — G9341 Metabolic encephalopathy: Secondary | ICD-10-CM | POA: Diagnosis not present

## 2024-06-30 DIAGNOSIS — L8990 Pressure ulcer of unspecified site, unspecified stage: Secondary | ICD-10-CM | POA: Insufficient documentation

## 2024-06-30 DIAGNOSIS — E1165 Type 2 diabetes mellitus with hyperglycemia: Secondary | ICD-10-CM | POA: Diagnosis not present

## 2024-06-30 DIAGNOSIS — Z8673 Personal history of transient ischemic attack (TIA), and cerebral infarction without residual deficits: Secondary | ICD-10-CM | POA: Diagnosis not present

## 2024-06-30 DIAGNOSIS — E870 Hyperosmolality and hypernatremia: Secondary | ICD-10-CM | POA: Diagnosis not present

## 2024-06-30 DIAGNOSIS — I959 Hypotension, unspecified: Secondary | ICD-10-CM | POA: Diagnosis not present

## 2024-06-30 DIAGNOSIS — E860 Dehydration: Secondary | ICD-10-CM | POA: Diagnosis not present

## 2024-06-30 LAB — CBC
HCT: 41.8 % (ref 39.0–52.0)
Hemoglobin: 13 g/dL (ref 13.0–17.0)
MCH: 28.2 pg (ref 26.0–34.0)
MCHC: 31.1 g/dL (ref 30.0–36.0)
MCV: 90.7 fL (ref 80.0–100.0)
Platelets: 132 10*3/uL — ABNORMAL LOW (ref 150–400)
RBC: 4.61 MIL/uL (ref 4.22–5.81)
RDW: 13.7 % (ref 11.5–15.5)
WBC: 8 10*3/uL (ref 4.0–10.5)
nRBC: 0 % (ref 0.0–0.2)

## 2024-06-30 LAB — SODIUM
Sodium: 147 mmol/L — ABNORMAL HIGH (ref 135–145)
Sodium: 148 mmol/L — ABNORMAL HIGH (ref 135–145)
Sodium: 149 mmol/L — ABNORMAL HIGH (ref 135–145)
Sodium: 149 mmol/L — ABNORMAL HIGH (ref 135–145)
Sodium: 154 mmol/L — ABNORMAL HIGH (ref 135–145)

## 2024-06-30 LAB — GLUCOSE, CAPILLARY
Glucose-Capillary: 208 mg/dL — ABNORMAL HIGH (ref 70–99)
Glucose-Capillary: 248 mg/dL — ABNORMAL HIGH (ref 70–99)
Glucose-Capillary: 266 mg/dL — ABNORMAL HIGH (ref 70–99)
Glucose-Capillary: 297 mg/dL — ABNORMAL HIGH (ref 70–99)
Glucose-Capillary: 325 mg/dL — ABNORMAL HIGH (ref 70–99)
Glucose-Capillary: 357 mg/dL — ABNORMAL HIGH (ref 70–99)
Glucose-Capillary: 385 mg/dL — ABNORMAL HIGH (ref 70–99)

## 2024-06-30 LAB — BASIC METABOLIC PANEL WITH GFR
Anion gap: 14 (ref 5–15)
BUN: 39 mg/dL — ABNORMAL HIGH (ref 6–20)
CO2: 22 mmol/L (ref 22–32)
Calcium: 8.6 mg/dL — ABNORMAL LOW (ref 8.9–10.3)
Chloride: 115 mmol/L — ABNORMAL HIGH (ref 98–111)
Creatinine, Ser: 1.23 mg/dL (ref 0.61–1.24)
GFR, Estimated: >60 mL/min
Glucose, Bld: 257 mg/dL — ABNORMAL HIGH (ref 70–99)
Potassium: 4.1 mmol/L (ref 3.5–5.1)
Sodium: 151 mmol/L — ABNORMAL HIGH (ref 135–145)

## 2024-06-30 LAB — LACTIC ACID, PLASMA: Lactic Acid, Venous: 1.7 mmol/L (ref 0.5–1.9)

## 2024-06-30 MED ORDER — METOPROLOL TARTRATE 12.5 MG HALF TABLET
12.5000 mg | ORAL_TABLET | Freq: Two times a day (BID) | ORAL | Status: DC
  Administered 2024-06-30 – 2024-07-02 (×6): 12.5 mg via ORAL
  Filled 2024-06-30 (×7): qty 1

## 2024-06-30 MED ORDER — INSULIN GLARGINE 100 UNIT/ML ~~LOC~~ SOLN
14.0000 [IU] | Freq: Every day | SUBCUTANEOUS | Status: DC
  Filled 2024-06-30: qty 0.14

## 2024-06-30 MED ORDER — ACETAMINOPHEN 325 MG PO TABS
650.0000 mg | ORAL_TABLET | Freq: Four times a day (QID) | ORAL | Status: DC | PRN
  Administered 2024-06-30 – 2024-07-03 (×3): 650 mg via ORAL
  Filled 2024-06-30 (×3): qty 2

## 2024-06-30 MED ORDER — FREE WATER
200.0000 mL | Status: DC
  Administered 2024-06-30 – 2024-07-01 (×6): 200 mL via ORAL

## 2024-06-30 MED ORDER — INSULIN ASPART 100 UNIT/ML IJ SOLN: 3.0000 [IU] | INTRAMUSCULAR | Status: DC

## 2024-06-30 MED ORDER — INSULIN ASPART 100 UNIT/ML IJ SOLN
0.0000 [IU] | INTRAMUSCULAR | Status: DC
  Administered 2024-06-30: 9 [IU] via SUBCUTANEOUS
  Administered 2024-06-30 (×2): 5 [IU] via SUBCUTANEOUS
  Administered 2024-06-30: 9 [IU] via SUBCUTANEOUS
  Administered 2024-07-01: 3 [IU] via SUBCUTANEOUS
  Administered 2024-07-01 (×2): 1 [IU] via SUBCUTANEOUS
  Administered 2024-07-01: 7 [IU] via SUBCUTANEOUS
  Administered 2024-07-02: 3 [IU] via SUBCUTANEOUS
  Administered 2024-07-02: 7 [IU] via SUBCUTANEOUS
  Administered 2024-07-02: 3 [IU] via SUBCUTANEOUS
  Administered 2024-07-02 (×2): 1 [IU] via SUBCUTANEOUS
  Administered 2024-07-02: 3 [IU] via SUBCUTANEOUS
  Administered 2024-07-03 (×3): 2 [IU] via SUBCUTANEOUS
  Administered 2024-07-03: 7 [IU] via SUBCUTANEOUS
  Filled 2024-06-30: qty 1
  Filled 2024-06-30: qty 9
  Filled 2024-06-30: qty 2
  Filled 2024-06-30 (×2): qty 5
  Filled 2024-06-30: qty 1
  Filled 2024-06-30: qty 6
  Filled 2024-06-30: qty 2
  Filled 2024-06-30: qty 7
  Filled 2024-06-30: qty 2
  Filled 2024-06-30: qty 1
  Filled 2024-06-30: qty 5
  Filled 2024-06-30: qty 3
  Filled 2024-06-30: qty 1
  Filled 2024-06-30: qty 2
  Filled 2024-06-30 (×2): qty 3
  Filled 2024-06-30: qty 9

## 2024-06-30 MED ORDER — PANTOPRAZOLE SODIUM 40 MG PO TBEC
40.0000 mg | DELAYED_RELEASE_TABLET | Freq: Every day | ORAL | Status: DC
  Administered 2024-06-30 – 2024-07-02 (×3): 40 mg via ORAL
  Filled 2024-06-30 (×3): qty 1

## 2024-06-30 MED ORDER — INSULIN GLARGINE-YFGN 100 UNIT/ML ~~LOC~~ SOLN
12.0000 [IU] | Freq: Every day | SUBCUTANEOUS | Status: DC
  Administered 2024-06-30 – 2024-07-03 (×4): 12 [IU] via SUBCUTANEOUS
  Filled 2024-06-30 (×4): qty 0.12

## 2024-06-30 NOTE — Inpatient Diabetes Management (Addendum)
 Inpatient Diabetes Program Recommendations  AACE/ADA: New Consensus Statement on Inpatient Glycemic Control (2015)  Target Ranges:  Prepandial:   less than 140 mg/dL      Peak postprandial:   less than 180 mg/dL (1-2 hours)      Critically ill patients:  140 - 180 mg/dL   Lab Results  Component Value Date   GLUCAP 266 (H) 06/30/2024   HGBA1C 8.7 (H) 06/15/2024    Review of Glycemic Control  Latest Reference Range & Units 06/29/24 22:30 06/29/24 23:26 06/30/24 00:25 06/30/24 03:23 06/30/24 05:28 06/30/24 08:18  Glucose-Capillary 70 - 99 mg/dL 790 (H) 763 (H) 751 (H) 325 (H) 208 (H) 266 (H)   Diabetes history: DM 2 Outpatient Diabetes medications:  Jardiance  10 mg daily Novolog  0-9 units tid with meals  Basaglar  14 units q HS Metformin  1000 mg bid Januvia 100 mg daily Current orders for Inpatient glycemic control:  Novolog  0-9 units q 4 hours  Inpatient Diabetes Program Recommendations:    Please consider adding Lantus  12 units daily.   Thanks,  Randall Bullocks, RN, BC-ADM Inpatient Diabetes Coordinator Pager [phone]  (8a-5p)

## 2024-06-30 NOTE — Progress Notes (Signed)
 " Daily Progress Note   Patient Name: Eric Cummings       Date: 06/30/2024 DOB: Sep 11, 1965  Age: 59 y.o. MRN#: [id number] Attending Physician: Jude Harden GAILS, MD Primary Care Physician: Onita Rush, MD Admit Date: 06/28/2024 Length of Stay: 2 days  Reason for Consultation/Follow-up: Establishing goals of care  Subjective:   CC: Goals of care  Subjective: Chart reviewed including most recent notes from PCCM and TOC.  Patient noted to be transferring out of ICU to general floor.  I saw and examined patient today.  No family is present at time of my encounter, though his sisters bag is present in the room.  He remains nonverbal but does appear to be more awake and opens eyes when I am in the room.  I called and was able to speak with the patient's brother who is his legal guardian.  He is excited that he is doing well enough to transfer out of the ICU.  Discussed continued concern about long-term outlook as he remains debilitated and with further deficits than he had prior to stroke.  Discussed concerns about his functional status, and his nutrition and hydration moving forward.  Brother reports understanding states that family is still in talks about long-term care plan.  Will likely be back to skilled facility for continued attempts at rehab, but the remains concerned about his ability to maintain adequate intake.   Review of Systems Unable to obtain  Objective:   Vital Signs:  BP (!) 130/57 (BP Location: Right Arm)   Pulse 62   Temp 98.6 F (37 C)   Resp 17   Wt 70.6 kg   SpO2 100%   BMI 22.33 kg/m   Physical Exam: General: Sleepy but opens eyes. HENT: normocephalic, atraumatic, moist mucous membranes Cardiovascular: RRR Respiratory: no increased work of breathing noted, not in respiratory distress Abdomen: not distended Skin: no rashes or lesions on visible skin Neuro: Opens eyes but does not follow commands, nonverbal at baseline  Imaging: @IMAGES @  I  personally reviewed recent imaging.   Assessment & Plan:   Assessment: 59 year old male with history of nonverbal autism who presented from skilled facility (there for acute rehab after stroke ) with altered mental status and hypotension.  He was found to be hypotensive, hyperglycemic and hyponatremic.  Recommendations/Plan: # Complex medical decision making/goals of care:  - Discussed with brother again today.  He is glad that patient has been stabilized enough to transition out of ICU.  Discussed continued concern about his intake moving forward and if this is new baseline after having recent CVA.  - Family continues to discuss options for long-term care as well as potential limitations of care.  Overall goal is to continue current care and see how he continues to progress.  Palliative to follow-up next week if he remains admitted.  -  Code Status: Full Code  Prognosis: Unable to determine   # Discharge Planning: SNF for continued rehab  -  Discussed with: Brother via phone  Thank you for allowing the palliative care team to participate in the care [name].  Amaryllis Meissner, MD Palliative Care Provider PMT # [phone]  If patient remains symptomatic despite maximum doses, please call PMT at [phone] between 0700 and 1900. Outside of these hours, please call attending, as PMT does not have night coverage.   I personally spent a total of 40 minutes in the care of the patient today including preparing to see the patient, getting/reviewing separately obtained history,  performing a medically appropriate exam/evaluation, and documenting clinical information in the EHR.  "

## 2024-06-30 NOTE — Plan of Care (Signed)
  Problem: Education: Goal: Ability to describe self-care measures that may prevent or decrease complications (Diabetes Survival Skills Education) will improve Outcome: Progressing   Problem: Coping: Goal: Ability to adjust to condition or change in health will improve Outcome: Progressing   Problem: Fluid Volume: Goal: Ability to maintain a balanced intake and output will improve Outcome: Progressing   Problem: Metabolic: Goal: Ability to maintain appropriate glucose levels will improve Outcome: Progressing   Problem: Nutritional: Goal: Maintenance of adequate nutrition will improve Outcome: Progressing   Problem: Tissue Perfusion: Goal: Adequacy of tissue perfusion will improve Outcome: Progressing   Problem: Education: Goal: Knowledge of General Education information will improve Description: Including pain rating scale, medication(s)/side effects and non-pharmacologic comfort measures Outcome: Progressing   

## 2024-06-30 NOTE — Consult Note (Addendum)
 " Consultation Note Date: 06/29/2024   Patient Name: Eric Cummings  DOB: [birth year]  MRN: [id number]  Age / Sex: 60 y.o., male   PCP: Eric Rush, MD Referring Physician: Jude Harden GAILS, MD  Reason for Consultation: Establishing goals of care     Chief Complaint/History of Present Illness:   Eric Cummings is a 59 year old male who presented from skilled facility (there for acute rehab following CVA) who was noted to have altered mental status and hypotension.  He is nonverbal at baseline.  On admission to the hospital he was found to be hypotensive, hyperglycemic, and hypernatremic.  He is reported to have had a recent follow-up with the weekend and was evaluated in the ED and return to facility.  He was started on fluids in the ED but had continued hypotension requiring vasopressor and was admitted to CCM.  Reported to have poor intake overall but no reports of nausea vomiting or diarrhea.  He is encephalopathic and also is noted to be nonverbal at baseline.  History has been obtained by family.  I met today with patient's brother and sister at the bedside.  They report that at baseline he lives with his sister and enjoys helping around the house and doing chores.  He likes to keep busy assisting with his daily needs including cleaning, tidying up, etc.  He has 2 sisters and a brother.  Eric Cummings is his brother and reports that he is his legal guardian.  I introduced palliative care as specialized medical care for people living with serious illness. It focuses on providing relief from the symptoms and stress of a serious illness. The goal is to improve quality of life for both the patient and the family.  We discussed clinical course as well as wishes moving forward in regard to advanced directives.   Values and goals of care important to patient and family were attempted to be elicited.   Reviewed concern that this recent event of poor intake, hyperglycemia, hypernatremia, and hypotensive  in light of poor intake may be a recurrent problem if his drive to 1 intake was affected by recent stroke.  They report that he did not have any issues with this prior to this event.  Family expressed concern that he may not be offered enough to drink at rehab facility as patient is not really able to voice when he needs more or is thirsty.  Discussed plan to see how he does over the next couple of days with continued discussion regarding long-term goals and potential limitations of care based upon how much he is able to rebound and hopeful further determination of his ability to maintain hydration on his own long-term.  Primary Diagnoses  Present on Admission:  AMS (altered mental status)   Palliative Review of Systems: Unable to obtain  Past Medical History:  Diagnosis Date   Autism    Diabetes mellitus without complication (HCC)    Eczema    Non-verbal learning disorder    Urticaria    Social History   Socioeconomic History   Marital status: Single    Spouse name: Not on file   Number of children: Not on file   Years of education: Not on file   Highest education level: Not on file  Occupational History   Not on file  Tobacco Use   Smoking status: Never   Smokeless tobacco: Never  Substance and Sexual Activity   Alcohol use: No   Drug use: No   Sexual activity: Not  on file  Other Topics Concern   Not on file  Social History Narrative   Not on file   Social Drivers of Health   Tobacco Use: Low Risk (06/28/2024)   Patient History    Smoking Tobacco Use: Never    Smokeless Tobacco Use: Never    Passive Exposure: Not on file  Financial Resource Strain: Not on file  Food Insecurity: Patient Unable To Answer (06/29/2024)   Epic    Worried About Programme Researcher, Broadcasting/film/video in the Last Year: Patient unable to answer    Ran Out of Food in the Last Year: Patient unable to answer  Transportation Needs: Patient Unable To Answer (06/29/2024)   Epic    Lack of Transportation  (Medical): Patient unable to answer    Lack of Transportation (Non-Medical): Patient unable to answer  Physical Activity: Not on file  Stress: Not on file  Social Connections: Not on file  Depression (EYV7-0): Not on file  Alcohol Screen: Not on file  Housing: Patient Unable To Answer (06/29/2024)   Epic    Unable to Pay for Housing in the Last Year: Patient unable to answer    Number of Times Moved in the Last Year: Not on file    Homeless in the Last Year: Patient unable to answer  Utilities: Patient Unable To Answer (06/29/2024)   Epic    Threatened with loss of utilities: Patient unable to answer  Health Literacy: Not on file   Family History  Problem Relation Age of Onset   Allergic rhinitis Mother    Asthma Father    Allergic rhinitis Father    Eczema Sister    Scheduled Meds:  apixaban   5 mg Oral BID   Chlorhexidine  Gluconate Cloth  6 each Topical Q0600   feeding supplement (GLUCERNA SHAKE)  237 mL Oral TID BM   insulin  aspart  0-9 Units Subcutaneous Q4H   mouth rinse  15 mL Mouth Rinse 4 times per day   pantoprazole  (PROTONIX ) IV  40 mg Intravenous Q24H   Continuous Infusions:  amiodarone  30 mg/hr (06/30/24 0051)   norepinephrine  (LEVOPHED ) Adult infusion Stopped (06/29/24 1119)   PRN Meds:.dextrose , mouth rinse, polyethylene glycol, senna Allergies[1] CBC:    Component Value Date/Time   WBC 18.8 (H) 06/29/2024 0243   HGB 15.5 06/29/2024 0243   HGB 15.9 03/20/2024 1526   HCT 51.3 06/29/2024 0243   HCT 49.9 03/20/2024 1526   PLT 192 06/29/2024 0243   PLT 309 03/20/2024 1526   MCV 92.3 06/29/2024 0243   MCV 89 03/20/2024 1526   NEUTROABS 13.2 (H) 06/28/2024 1848   NEUTROABS 6.1 03/20/2024 1526   LYMPHSABS 1.1 06/28/2024 1848   LYMPHSABS 1.4 03/20/2024 1526   MONOABS 0.4 06/28/2024 1848   EOSABS 0.0 06/28/2024 1848   EOSABS 0.1 03/20/2024 1526   BASOSABS 0.0 06/28/2024 1848   BASOSABS 0.0 03/20/2024 1526   Comprehensive Metabolic Panel:    Component  Value Date/Time   NA 151 (H) 06/30/2024 0402   NA 136 03/20/2024 1526   K 4.1 06/30/2024 0402   CL 115 (H) 06/30/2024 0402   CO2 22 06/30/2024 0402   BUN 39 (H) 06/30/2024 0402   BUN 36 (H) 03/20/2024 1526   CREATININE 1.23 06/30/2024 0402   GLUCOSE 257 (H) 06/30/2024 0402   CALCIUM 8.6 (L) 06/30/2024 0402   AST 47 (H) 06/28/2024 1848   ALT 42 06/28/2024 1848   ALKPHOS 81 06/28/2024 1848   BILITOT 0.4 06/28/2024 1848  BILITOT 0.5 03/20/2024 1526   PROT 6.2 (L) 06/28/2024 1848   PROT 7.6 03/20/2024 1526   ALBUMIN  3.3 (L) 06/28/2024 1848   ALBUMIN  4.7 03/20/2024 1526    Physical Exam: Vital Signs: BP 118/60   Pulse 61   Temp 98.3 F (36.8 C) (Oral)   Resp 17   Wt 70.6 kg   SpO2 95%   BMI 22.33 kg/m  SpO2: SpO2: 95 % O2 Device: O2 Device: Room Air O2 Flow Rate: O2 Flow Rate (L/min): 1 L/min Intake/output summary:  Intake/Output Summary (Last 24 hours) at 06/30/2024 0912 Last data filed at 06/30/2024 0600 Gross per 24 hour  Intake 2736.83 ml  Output 2200 ml  Net 536.83 ml   LBM: Last BM Date : 06/30/24 Baseline Weight: Weight: 70.6 kg Most recent weight: Weight: 70.6 kg  General: Somnolent and does not arouse to gentle verbal or tactile stimulation HENT: normocephalic, atraumatic, moist mucous membranes Cardiovascular: RRR Respiratory: no increased work of breathing noted, not in respiratory distress Abdomen: not distended Skin: no rashes or lesions on visible skin Neuro: Somnolent          Palliative Performance Scale: 20               Additional Data Reviewed: Recent Labs    06/28/24 1848 06/28/24 1858 06/28/24 1859 06/28/24 2154 06/29/24 0243 06/29/24 0737 06/29/24 2359 06/30/24 0402  WBC 14.7*  --   --   --  18.8*  --   --   --   HGB 15.6   < > 16.3  --  15.5  --   --   --   PLT 214  --   --   --  192  --   --   --   NA 161*   < > 163*   < > 162*   < > 154* 151*  BUN 104*   < >  --    < > 90*  --   --  39*  CREATININE 2.48*   < >  --    < >  2.12*  --   --  1.23   < > = values in this interval not displayed.    Imaging: CT Head Wo Contrast CLINICAL DATA:  Mental status change  EXAM: CT HEAD WITHOUT CONTRAST  TECHNIQUE: Contiguous axial images were obtained from the base of the skull through the vertex without intravenous contrast.  RADIATION DOSE REDUCTION: This exam was performed according to the departmental dose-optimization program which includes automated exposure control, adjustment of the mA and/or kV according to patient size and/or use of iterative reconstruction technique.  COMPARISON:  CT 06/24/2024, MRI 06/15/2024  FINDINGS: Brain: No acute territorial infarction, hemorrhage or intracranial mass. Stable ventricle size. Mild chronic small vessel ischemic changes of the white matter. Small chronic right ganglial capsular infarct.  Vascular: No hyperdense vessels.  No unexpected calcification  Skull: Normal. Negative for fracture or focal lesion.  Sinuses/Orbits: Mucous retention cysts  Other: None  IMPRESSION: 1. No CT evidence for acute intracranial abnormality. 2. Mild chronic small vessel ischemic changes of the white matter.  Electronically Signed   By: Luke Bun M.D.   On: 06/29/2024 02:18    I personally reviewed recent imaging.   Palliative Care Assessment and Plan Summary of Established Goals of Care and Medical Treatment Preferences    # Complex medical decision making/goals of care  - Discussed with brother, Eric Cummings, who reports he is patient's guardian as well as  his sister whom he lives with.  - Plan for continued care with current treatment plan to see how he progresses in regards to mental status and clinical course over the next several days.  Discussed that this may be a new baseline following recent stroke and if so we will need to continue to work to determine long-term care goals as events that brought him to the hospital may be a recurrent problem moving forward.  -   Code Status: Full Code  Prognosis: Unable to determine   # Discharge Planning:  To Be Determined  Thank you for allowing the palliative care team to participate in the care [name].  Amaryllis Meissner, MD Palliative Care Provider PMT # [phone]  If patient remains symptomatic despite maximum doses, please call PMT at [phone] between 0700 and 1900. Outside of these hours, please call attending, as PMT does not have night coverage.   I personally spent a total of 78 minutes in the care of the patient today including preparing to see the patient, getting/reviewing separately obtained history, performing a medically appropriate exam/evaluation, counseling and educating, referring and communicating with other health care professionals, and documenting clinical information in the EHR.     [1]  Allergies Allergen Reactions   Ace Inhibitors     Other reaction(s): cough Other reaction(s): cough   Iodine     Other reaction(s): Unknown Other reaction(s): Unknown   Shellfish Allergy      Other reaction(s): Unknown   Shellfish Protein-Containing Drug Products     Other reaction(s): Unknown Other reaction(s): Unknown   Simvastatin     Other reaction(s): intolerance to Other reaction(s): intolerance to   "

## 2024-06-30 NOTE — Progress Notes (Signed)
" ° °  NAME:  Eric Cummings, MRN:  [id number], DOB:  21-Nov-1965, LOS: 2 ADMISSION DATE:  06/28/2024, CONSULTATION DATE:  06/28/24 REFERRING MD:  EDP, CHIEF COMPLAINT:  ams/hypernatremia   History of Present Illness:  59 yo male presented from SNF (placed after recent discharge in mid January 2/2 acute cva) 2/2 ams noted by staff and subsequently found to have hypotension. Of note pt is non-verbal at baseline.  Upon presentation pt found to be hypotensive to 70's, hyperglycemia to >500's and hypernatremic, despite hyperglycemia, to 160's. Per family pt had recent fall over the weekend was evaluated in ED for this as pt is on chronic eliquis . Pt was given IVF in ed and pressors stopped. However once fluids stopped pt's BP began to downtrend again and he was started on norepi for which ccm was consulted  Pt has had poor po intake, no reported n/v/d. No fever/chills documented. All history is obtained from chart and family 2/2 pt's baseline status  Pertinent  Medical History  T2dm with hyperglycemia Autism, non verbal at baseline Cva H/o afib with rvr  Significant Hospital Events: Including procedures, antibiotic start and stop dates in addition to other pertinent events   Admitted to ICU 06/28/24 for hypotension and hypernatremia 1/29 off pressors . Improving hypernatremia  Interim History / Subjective:  Alert, tolerating PO intake. Increased urine output. Blood pressure maintaining without pressors. No pain  Objective    Blood pressure 118/60, pulse 61, temperature 98.3 F (36.8 C), temperature source Oral, resp. rate 17, weight 70.6 kg, SpO2 95%.        Intake/Output Summary (Last 24 hours) at 06/30/2024 0853 Last data filed at 06/30/2024 0600 Gross per 24 hour  Intake 2892.49 ml  Output 2300 ml  Net 592.49 ml   Filed Weights   06/29/24 0500  Weight: 70.6 kg    Examination: Alert non verbal adult, able to mimic behaviors in NAD Clear to auscultation on anteriolateral lung  exam RRR on auscultation and telemetry Abd soft Squeezing fingers l>R and wiggling toes L>R after observing behavior. Tracks   Resolved problem list   Assessment and Plan  Acute metabolic encephalopathy,improving Hypovolemic hypernatremia Dehydration 164>151 today s/p D5W. Water  deficit 2.5L - Will transition to free water  per mouth  200 mL per mouth every 4 hrs for 24 hrs - Recheck Na in the AM - Goal Na 140 in the next 24 hrs  H/o cva on chronic a/c RUE and RLE weakness - Therapeutic lovenox  H/o autism Non verbal at baseline but able to mimic and follow directions  Lactic acidosis 4>3.5 - Follow up until resolution  AKI, Resolved today. 1.23. Good urine output BUN improving 90>39  T2DM Hyperglycemia No ketosis/DKA - 266 - SSI restarted today after episode of hypoglycemia  H/o afib on chronic a/c Started on amiodarone  but will transition to PTA metoprolol  at half dose (12.5 mg BID) - Eliquis  5 mg BID  Hypotension Resolved with fluid resuscitation Off levophed  for 24 hrs. Maintaining systolic BP >120 this AM  GI On PPI/ miralax /Senna  Family discussion 1/29 May need to consider a different disposition, I am worried how he got this dehydrated at his SNF  Dispo: Transfer out of the ICU to [facility] on 1/31  "

## 2024-06-30 NOTE — Plan of Care (Signed)
" °  Problem: Nutritional: Goal: Maintenance of adequate nutrition will improve Outcome: Progressing   Problem: Education: Goal: Knowledge of General Education information will improve Description: Including pain rating scale, medication(s)/side effects and non-pharmacologic comfort measures Outcome: Progressing   Problem: Clinical Measurements: Goal: Diagnostic test results will improve Outcome: Progressing Goal: Respiratory complications will improve Outcome: Progressing Goal: Cardiovascular complication will be avoided Outcome: Progressing   Problem: Clinical Measurements: Goal: Respiratory complications will improve Outcome: Progressing   Problem: Clinical Measurements: Goal: Cardiovascular complication will be avoided Outcome: Progressing   Problem: Coping: Goal: Level of anxiety will decrease Outcome: Progressing   Problem: Cardiac: Goal: Ability to maintain an adequate cardiac output will improve Outcome: Progressing   "

## 2024-06-30 NOTE — Plan of Care (Signed)
  Problem: Education: Goal: Ability to describe self-care measures that may prevent or decrease complications (Diabetes Survival Skills Education) will improve Outcome: Progressing Goal: Individualized Educational Video(s) Outcome: Progressing   Problem: Coping: Goal: Ability to adjust to condition or change in health will improve Outcome: Progressing   Problem: Fluid Volume: Goal: Ability to maintain a balanced intake and output will improve Outcome: Progressing   Problem: Health Behavior/Discharge Planning: Goal: Ability to identify and utilize available resources and services will improve Outcome: Progressing Goal: Ability to manage health-related needs will improve Outcome: Progressing   Problem: Metabolic: Goal: Ability to maintain appropriate glucose levels will improve Outcome: Progressing   Problem: Nutritional: Goal: Maintenance of adequate nutrition will improve Outcome: Progressing Goal: Progress toward achieving an optimal weight will improve Outcome: Progressing   Problem: Skin Integrity: Goal: Risk for impaired skin integrity will decrease Outcome: Progressing   Problem: Tissue Perfusion: Goal: Adequacy of tissue perfusion will improve Outcome: Progressing   Problem: Education: Goal: Knowledge of General Education information will improve Description: Including pain rating scale, medication(s)/side effects and non-pharmacologic comfort measures Outcome: Progressing   Problem: Health Behavior/Discharge Planning: Goal: Ability to manage health-related needs will improve Outcome: Progressing   Problem: Clinical Measurements: Goal: Ability to maintain clinical measurements within normal limits will improve Outcome: Progressing Goal: Will remain free from infection Outcome: Progressing Goal: Diagnostic test results will improve Outcome: Progressing Goal: Respiratory complications will improve Outcome: Progressing Goal: Cardiovascular complication will  be avoided Outcome: Progressing   Problem: Activity: Goal: Risk for activity intolerance will decrease Outcome: Progressing   Problem: Nutrition: Goal: Adequate nutrition will be maintained Outcome: Progressing   Problem: Coping: Goal: Level of anxiety will decrease Outcome: Progressing   Problem: Elimination: Goal: Will not experience complications related to bowel motility Outcome: Progressing Goal: Will not experience complications related to urinary retention Outcome: Progressing   Problem: Pain Managment: Goal: General experience of comfort will improve and/or be controlled Outcome: Progressing   Problem: Safety: Goal: Ability to remain free from injury will improve Outcome: Progressing   Problem: Skin Integrity: Goal: Risk for impaired skin integrity will decrease Outcome: Progressing   Problem: Education: Goal: Ability to describe self-care measures that may prevent or decrease complications (Diabetes Survival Skills Education) will improve Outcome: Progressing Goal: Individualized Educational Video(s) Outcome: Progressing   Problem: Cardiac: Goal: Ability to maintain an adequate cardiac output will improve Outcome: Progressing   Problem: Health Behavior/Discharge Planning: Goal: Ability to identify and utilize available resources and services will improve Outcome: Progressing Goal: Ability to manage health-related needs will improve Outcome: Progressing   Problem: Fluid Volume: Goal: Ability to achieve a balanced intake and output will improve Outcome: Progressing   Problem: Metabolic: Goal: Ability to maintain appropriate glucose levels will improve Outcome: Progressing   Problem: Nutritional: Goal: Maintenance of adequate nutrition will improve Outcome: Progressing Goal: Maintenance of adequate weight for body size and type will improve Outcome: Progressing   Problem: Respiratory: Goal: Will regain and/or maintain adequate  ventilation Outcome: Progressing   Problem: Urinary Elimination: Goal: Ability to achieve and maintain adequate renal perfusion and functioning will improve Outcome: Progressing

## 2024-07-01 DIAGNOSIS — R400 Somnolence: Secondary | ICD-10-CM | POA: Diagnosis not present

## 2024-07-01 DIAGNOSIS — G9341 Metabolic encephalopathy: Secondary | ICD-10-CM | POA: Diagnosis not present

## 2024-07-01 DIAGNOSIS — E870 Hyperosmolality and hypernatremia: Secondary | ICD-10-CM | POA: Diagnosis not present

## 2024-07-01 LAB — GLUCOSE, CAPILLARY
Glucose-Capillary: 134 mg/dL — ABNORMAL HIGH (ref 70–99)
Glucose-Capillary: 141 mg/dL — ABNORMAL HIGH (ref 70–99)
Glucose-Capillary: 149 mg/dL — ABNORMAL HIGH (ref 70–99)
Glucose-Capillary: 161 mg/dL — ABNORMAL HIGH (ref 70–99)
Glucose-Capillary: 243 mg/dL — ABNORMAL HIGH (ref 70–99)
Glucose-Capillary: 315 mg/dL — ABNORMAL HIGH (ref 70–99)

## 2024-07-01 LAB — CBC
HCT: 38.9 % — ABNORMAL LOW (ref 39.0–52.0)
Hemoglobin: 12.4 g/dL — ABNORMAL LOW (ref 13.0–17.0)
MCH: 27.7 pg (ref 26.0–34.0)
MCHC: 31.9 g/dL (ref 30.0–36.0)
MCV: 87 fL (ref 80.0–100.0)
Platelets: 122 10*3/uL — ABNORMAL LOW (ref 150–400)
RBC: 4.47 MIL/uL (ref 4.22–5.81)
RDW: 13.6 % (ref 11.5–15.5)
WBC: 7.2 10*3/uL (ref 4.0–10.5)
nRBC: 0 % (ref 0.0–0.2)

## 2024-07-01 LAB — BASIC METABOLIC PANEL WITH GFR
Anion gap: 11 (ref 5–15)
BUN: 27 mg/dL — ABNORMAL HIGH (ref 6–20)
CO2: 22 mmol/L (ref 22–32)
Calcium: 8.7 mg/dL — ABNORMAL LOW (ref 8.9–10.3)
Chloride: 116 mmol/L — ABNORMAL HIGH (ref 98–111)
Creatinine, Ser: 0.97 mg/dL (ref 0.61–1.24)
GFR, Estimated: >60 mL/min
Glucose, Bld: 142 mg/dL — ABNORMAL HIGH (ref 70–99)
Potassium: 3.8 mmol/L (ref 3.5–5.1)
Sodium: 149 mmol/L — ABNORMAL HIGH (ref 135–145)

## 2024-07-01 LAB — SODIUM
Sodium: 150 mmol/L — ABNORMAL HIGH (ref 135–145)
Sodium: 146 mmol/L — ABNORMAL HIGH (ref 135–145)
Sodium: 140 mmol/L (ref 135–145)
Sodium: 141 mmol/L (ref 135–145)
Sodium: 143 mmol/L (ref 135–145)

## 2024-07-01 MED ORDER — FREE WATER
200.0000 mL | Status: DC
  Administered 2024-07-01 (×5): 200 mL

## 2024-07-01 NOTE — Progress Notes (Signed)
 " PROGRESS NOTE    Eric Cummings  FMW:[id number] DOB: 1965/11/12 DOA: 06/28/2024 PCP: Onita Rush, MD    Brief Narrative:  59 year old gentleman who was recently discharged to a skilled nursing facility after suffering acute stroke, found to be encephalopathic and hypotensive at nursing home so sent to the ER.  Initially patient with blood pressure 70s, blood glucose more than 500, hypernatremic.  Patient with baseline dysarthria.  Patient was given IV fluids, did not respond to fluid resuscitation and needed vasopressors so admitted to ICU.  Patient with poor oral intake.  He does have a history of autism, he is nonverbal at baseline.  Recent stroke.  Chronic A-fib on Eliquis .  Type 2 diabetes on insulin . Patient lives at home and previously independent.  His brother and sister help him at home.  2 weeks ago he was in the hospital after falling at daycare program, he had in-house stroke.  Found to have new onset A-fib and started on Eliquis . Patient was discharged to a skilled nursing facility.  Subjective: Patient seen and examined.  His brother was at the bedside.  He was sleepy.  He did wake up and follow simple commands.  Brother stated that patient was thirsty.  When awake, he is at his usual mentation.  Patient is very weak as per the brother.  He is hesitant to use his right side, however noticed it spontaneously using it at times. Sodium is 149.   Assessment & Plan:   Acute metabolic encephalopathy due to hypovolemic hypernatremia and severe dehydration, severe free water  deficit. Presented with sodium of 164 and treated with hypotonic fluid, gradually improving. Sodium 149 today. Total water  deficit was 2.5 L.  Now encouraging oral intake.  Monitor sodium closely on oral intake without additional IV fluids to make sure he can maintain. Mental status is improving.  Has autism at baseline and nonverbal. Repeat CT head was done that was without any acute findings. Encourage water   intake.  200 mL every 2 hours.  History of stroke, chronic anticoagulation.  Right hemiparesis.  Consult PT OT and start mobilizing.  Refer back to a skilled nursing facility.  Care staffs need to ensure water  access to this patient.    DVT prophylaxis: SCDs Start: 06/28/24 2115 apixaban  (ELIQUIS ) tablet 5 mg   Code Status: Full code Family Communication: Brother at the bedside Disposition Plan: Status is: Inpatient Remains inpatient appropriate because: Altered mentation, electrolyte abnormalities     Consultants:  Critical care  Procedures:  None  Antimicrobials:  None     Objective: Vitals:   07/01/24 0018 07/01/24 0419 07/01/24 0444 07/01/24 0811  BP: 119/60 128/60  129/72  Pulse: 68 (!) 58  84  Resp:  16  16  Temp: 99.6 F (37.6 C) 97.8 F (36.6 C)  98.1 F (36.7 C)  TempSrc:      SpO2: 100% 99%  99%  Weight:   70.4 kg     Intake/Output Summary (Last 24 hours) at 07/01/2024 1131 Last data filed at 07/01/2024 0943 Gross per 24 hour  Intake 1219.4 ml  Output 2550 ml  Net -1330.6 ml   Filed Weights   06/29/24 0500 07/01/24 0444  Weight: 70.6 kg 70.4 kg    Examination:  General exam: Appears calm and comfortable . Laying in bed . Chronically sick looking  Respiratory system: Clear to auscultation. Respiratory effort normal. On room air. Cardiovascular system: S1 & S2 heard, RRR.  No pedal edema. Gastrointestinal system: Abdomen is nondistended, soft  and nontender. No organomegaly or masses felt. Normal bowel sounds heard. Central nervous system: Alert on stimulation. Dysarthric. Tries to talk to family . Moving all extremities but right sided with less movement.  Seen spontaneously moving otherwise.    Data Reviewed: I have personally reviewed following labs and imaging studies  CBC: Recent Labs  Lab 06/28/24 1848 06/28/24 1858 06/28/24 1859 06/29/24 0243 06/30/24 1308 07/01/24 0717  WBC 14.7*  --   --  18.8* 8.0 7.2  NEUTROABS 13.2*  --    --   --   --   --   HGB 15.6 16.7 16.3 15.5 13.0 12.4*  HCT 52.1* 49.0 48.0 51.3 41.8 38.9*  MCV 94.4  --   --  92.3 90.7 87.0  PLT 214  --   --  192 132* 122*   Basic Metabolic Panel: Recent Labs  Lab 06/28/24 1848 06/28/24 1858 06/28/24 1859 06/28/24 2154 06/29/24 0243 06/29/24 0737 06/30/24 0402 06/30/24 0926 06/30/24 1308 06/30/24 1707 06/30/24 2101 07/01/24 0104 07/01/24 0717  NA 161* 164* 163* 156* 162*   < > 151*   < > 147* 149* 149* 150* 149*  K 4.4 4.3 4.3 5.1 5.1  --  4.1  --   --   --   --   --  3.8  CL 128* 129*  --  126* 128*  --  115*  --   --   --   --   --  116*  CO2 20*  --   --  18* 22  --  22  --   --   --   --   --  22  GLUCOSE 464* 445*  --  405* 374*  --  257*  --   --   --   --   --  142*  BUN 104* 101*  --  94* 90*  --  39*  --   --   --   --   --  27*  CREATININE 2.48* 2.50*  --  2.18* 2.12*  --  1.23  --   --   --   --   --  0.97  CALCIUM 9.0  --   --  8.2* 8.8*  --  8.6*  --   --   --   --   --  8.7*  MG  --   --   --  2.4 2.6*  --   --   --   --   --   --   --   --    < > = values in this interval not displayed.   GFR: Estimated Creatinine Clearance: 82.7 mL/min (by C-G formula based on SCr of 0.97 mg/dL). Liver Function Tests: Recent Labs  Lab 06/28/24 1848  AST 47*  ALT 42  ALKPHOS 81  BILITOT 0.4  PROT 6.2*  ALBUMIN  3.3*   No results for input(s): LIPASE, AMYLASE in the last 168 hours. No results for input(s): AMMONIA in the last 168 hours. Coagulation Profile: No results for input(s): INR, PROTIME in the last 168 hours. Cardiac Enzymes: No results for input(s): CKTOTAL, CKMB, CKMBINDEX, TROPONINI in the last 168 hours. BNP (last 3 results) No results for input(s): PROBNP in the last 8760 hours. HbA1C: No results for input(s): HGBA1C in the last 72 hours. CBG: Recent Labs  Lab 06/30/24 1640 06/30/24 2013 07/01/24 0020 07/01/24 0424 07/01/24 0755  GLUCAP 357* 297* 149* 134* 141*   Lipid Profile: No  results for  input(s): CHOL, HDL, LDLCALC, TRIG, CHOLHDL, LDLDIRECT in the last 72 hours. Thyroid  Function Tests: Recent Labs    06/28/24 2154  TSH 0.586  FREET4 0.92   Anemia Panel: No results for input(s): VITAMINB12, FOLATE, FERRITIN, TIBC, IRON, RETICCTPCT in the last 72 hours. Sepsis Labs: Recent Labs  Lab 06/28/24 2203 06/29/24 0737 06/29/24 1128 06/30/24 0926  LATICACIDVEN 4.0* 4.0* 3.5* 1.7    Recent Results (from the past 240 hours)  Resp panel by RT-PCR (RSV, Flu A&B, Covid) Anterior Nasal Swab     Status: None   Collection Time: 06/28/24  5:36 PM   Specimen: Anterior Nasal Swab  Result Value Ref Range Status   SARS Coronavirus 2 by RT PCR NEGATIVE NEGATIVE Final   Influenza A by PCR NEGATIVE NEGATIVE Final   Influenza B by PCR NEGATIVE NEGATIVE Final    Comment: (NOTE) The Xpert Xpress SARS-CoV-2/FLU/RSV plus assay is intended as an aid in the diagnosis of influenza from Nasopharyngeal swab specimens and should not be used as a sole basis for treatment. Nasal washings and aspirates are unacceptable for Xpert Xpress SARS-CoV-2/FLU/RSV testing.  Fact Sheet for Patients: bloggercourse.com  Fact Sheet for Healthcare Providers: seriousbroker.it  This test is not yet approved or cleared by the [place]  FDA and has been authorized for detection and/or diagnosis of SARS-CoV-2 by FDA under an Emergency Use Authorization (EUA). This EUA will remain in effect (meaning this test can be used) for the duration of the COVID-19 declaration under Section 564(b)(1) of the Act, 21 U.S.C. section 360bbb-3(b)(1), unless the authorization is terminated or revoked.     Resp Syncytial Virus by PCR NEGATIVE NEGATIVE Final    Comment: (NOTE) Fact Sheet for Patients: bloggercourse.com  Fact Sheet for Healthcare Providers: seriousbroker.it  This test  is not yet approved or cleared by the [place]  FDA and has been authorized for detection and/or diagnosis of SARS-CoV-2 by FDA under an Emergency Use Authorization (EUA). This EUA will remain in effect (meaning this test can be used) for the duration of the COVID-19 declaration under Section 564(b)(1) of the Act, 21 U.S.C. section 360bbb-3(b)(1), unless the authorization is terminated or revoked.  Performed at [facility], [address] [place], [place] [address]   Culture, blood (Routine X 2) w Reflex to ID Panel     Status: None (Preliminary result)   Collection Time: 06/28/24  6:00 PM   Specimen: BLOOD LEFT WRIST  Result Value Ref Range Status   Specimen Description BLOOD LEFT WRIST  Final   Special Requests   Final    BOTTLES DRAWN AEROBIC AND ANAEROBIC Blood Culture results may not be optimal due to an inadequate volume of blood received in culture bottles   Culture   Final    NO GROWTH 3 DAYS Performed at [facility], [address] [place], [place] [address]    Report Status PENDING  Incomplete  Culture, blood (Routine X 2) w Reflex to ID Panel     Status: None (Preliminary result)   Collection Time: 06/28/24  6:49 PM   Specimen: BLOOD RIGHT HAND  Result Value Ref Range Status   Specimen Description BLOOD RIGHT HAND  Final   Special Requests   Final    BOTTLES DRAWN AEROBIC ONLY Blood Culture results may not be optimal due to an inadequate volume of blood received in culture bottles   Culture   Final    NO GROWTH 3 DAYS Performed at [facility], [address] [place], [place] [address]    Report Status PENDING  Incomplete  MRSA Next Gen by PCR, Nasal     Status: None   Collection Time: 06/28/24  9:15 PM   Specimen: Nasal Mucosa; Nasal Swab  Result Value Ref Range Status   MRSA by PCR Next Gen NOT DETECTED NOT DETECTED Final    Comment: (NOTE) The GeneXpert MRSA Assay (FDA approved for NASAL specimens only), is one component of a  comprehensive MRSA colonization surveillance program. It is not intended to diagnose MRSA infection nor to guide or monitor treatment for MRSA infections. Test performance is not FDA approved in patients less than 8 years old. Performed at [facility], [address], [place], [place] [address]          Radiology Studies: No results found.      Scheduled Meds:  apixaban   5 mg Oral BID   Chlorhexidine  Gluconate Cloth  6 each Topical Q0600   feeding supplement (GLUCERNA SHAKE)  237 mL Oral TID BM   free water   200 mL Per Tube Q2H   insulin  aspart  0-9 Units Subcutaneous Q4H   insulin  glargine-yfgn  12 Units Subcutaneous Daily   metoprolol  tartrate  12.5 mg Oral BID   mouth rinse  15 mL Mouth Rinse 4 times per day   pantoprazole   40 mg Oral Daily   Continuous Infusions:   LOS: 3 days    Time spent: 55 minutes    Renato Applebaum, MD [facility]   "

## 2024-07-01 NOTE — Evaluation (Signed)
 Clinical/Bedside Swallow Evaluation Patient Details  Name: Eric Cummings MRN: [id number] Date of Birth: 1965-11-01  Today's Date: 07/01/2024 Time: SLP Start Time (ACUTE ONLY): 9247 SLP Stop Time (ACUTE ONLY): 0806 SLP Time Calculation (min) (ACUTE ONLY): 14 min  Past Medical History:  Past Medical History:  Diagnosis Date   Autism    Diabetes mellitus without complication (HCC)    Eczema    Non-verbal learning disorder    Urticaria    Past Surgical History: History reviewed. No pertinent surgical history. HPI:  59 yo male presenting to ED 1/28 with hypotension, hyperglycemia, and hypernatremic. CXR negative. Recently evaluated by SLP, recommending regular diet and thin liquids s/p L ACA CVA 06/16/24. PMH includes T2DM, autism, prior CVA, h/o A-fib with RVR    Assessment / Plan / Recommendation  Clinical Impression  Discussed [name]'s and his family's goals with his brother, who reports they want to prioritize adequate nutrition/hydration. Modifying his diet is unlikely to advance that goal. His family understands that his performance may fluctuate but with increased support and supervision, recommend upgrading to regular solids and thin liquids. Will f/u at least briefly.   Pt's diet was modified to purees after family/staff noticed pocketing earlier this admission. Today, he is alert and feeding himself with prompt oral transit. There are occasionally oral residuals but he can be cued to clear them with a liquid wash. He drank >8 oz of water  via large, consecutive sips with no s/s of aspiration. He grimaces occasionally across all POs, which his family correlates with throat pain.   SLP Visit Diagnosis: Dysphagia, unspecified (R13.10)    Aspiration Risk  Mild aspiration risk    Diet Recommendation           Other Recommendations Oral Care Recommendations: Oral care BID     Swallow Evaluation Recommendations Recommendations: PO diet PO Diet Recommendation: Regular;Thin  liquids (Level 0) Liquid Administration via: Cup;Straw Medication Administration: Whole meds with puree Supervision: Patient able to self-feed;Full supervision/cueing for swallowing strategies Swallowing strategies  : Check for pocketing or oral holding;Slow rate;Small bites/sips Postural changes: Position pt fully upright for meals Oral care recommendations: Oral care BID (2x/day)   Assistance Recommended at Discharge    Functional Status Assessment Patient has had a recent decline in their functional status and demonstrates the ability to make significant improvements in function in a reasonable and predictable amount of time.  Frequency and Duration min 2x/week  1 week       Prognosis Prognosis for improved oropharyngeal function: Good Barriers to Reach Goals: Cognitive deficits      Swallow Study   General HPI: 59 yo male presenting to ED 1/28 with hypotension, hyperglycemia, and hypernatremic. CXR negative. Recently evaluated by SLP, recommending regular diet and thin liquids s/p L ACA CVA 06/16/24. PMH includes T2DM, autism, prior CVA, h/o A-fib with RVR Type of Study: Bedside Swallow Evaluation Previous Swallow Assessment: see HPI Diet Prior to this Study: Dysphagia 1 (pureed);Thin liquids (Level 0) Temperature Spikes Noted: No Respiratory Status: Room air History of Recent Intubation: No Behavior/Cognition: Alert;Cooperative;Pleasant mood;Requires cueing Oral Cavity Assessment: Within Functional Limits Oral Care Completed by SLP: No Oral Cavity - Dentition: Adequate natural dentition Vision: Functional for self-feeding Self-Feeding Abilities: Able to feed self Patient Positioning: Upright in bed Baseline Vocal Quality: Not observed Volitional Cough: Cognitively unable to elicit Volitional Swallow: Unable to elicit    Oral/Motor/Sensory Function Overall Oral Motor/Sensory Function: Within functional limits   Ice Chips Ice chips: Not tested   Thin  Liquid Thin Liquid:  Within functional limits Presentation: Straw;Self Fed    Nectar Thick Nectar Thick Liquid: Not tested   Honey Thick Honey Thick Liquid: Not tested   Puree Puree: Within functional limits Presentation: Spoon   Solid     Solid: Within functional limits Presentation: Self Fed      Damien Blumenthal, M.A., CCC-SLP Speech Language Pathology, Acute Rehabilitation Services  Secure Chat preferred [phone]  07/01/2024,8:36 AM

## 2024-07-01 NOTE — Progress Notes (Signed)
 SLP Cancellation Note  Patient Details Name: Eric Cummings MRN: [id number] DOB: 1966/03/12   Cancelled treatment:       Reason Eval/Treat Not Completed: CTH negative this admission and pt is non-speaking at baseline secondary to ASD. He follows commands intermittently and mirrors gestures. His brother denies acute changes. Will defer formal cognitive-linguistic evaluation at this time.    Damien Blumenthal, M.A., CCC-SLP Speech Language Pathology, Acute Rehabilitation Services  Secure Chat preferred [phone]  07/01/2024, 8:40 AM

## 2024-07-02 DIAGNOSIS — R400 Somnolence: Secondary | ICD-10-CM | POA: Diagnosis not present

## 2024-07-02 DIAGNOSIS — E870 Hyperosmolality and hypernatremia: Secondary | ICD-10-CM | POA: Diagnosis not present

## 2024-07-02 DIAGNOSIS — G9341 Metabolic encephalopathy: Secondary | ICD-10-CM | POA: Diagnosis not present

## 2024-07-02 LAB — BASIC METABOLIC PANEL WITH GFR
Anion gap: 11 (ref 5–15)
BUN: 22 mg/dL — ABNORMAL HIGH (ref 6–20)
CO2: 20 mmol/L — ABNORMAL LOW (ref 22–32)
Calcium: 8.5 mg/dL — ABNORMAL LOW (ref 8.9–10.3)
Chloride: 112 mmol/L — ABNORMAL HIGH (ref 98–111)
Creatinine, Ser: 0.94 mg/dL (ref 0.61–1.24)
GFR, Estimated: >60 mL/min
Glucose, Bld: 157 mg/dL — ABNORMAL HIGH (ref 70–99)
Potassium: 4 mmol/L (ref 3.5–5.1)
Sodium: 143 mmol/L (ref 135–145)

## 2024-07-02 LAB — GLUCOSE, CAPILLARY
Glucose-Capillary: 135 mg/dL — ABNORMAL HIGH (ref 70–99)
Glucose-Capillary: 141 mg/dL — ABNORMAL HIGH (ref 70–99)
Glucose-Capillary: 225 mg/dL — ABNORMAL HIGH (ref 70–99)
Glucose-Capillary: 243 mg/dL — ABNORMAL HIGH (ref 70–99)
Glucose-Capillary: 243 mg/dL — ABNORMAL HIGH (ref 70–99)
Glucose-Capillary: 251 mg/dL — ABNORMAL HIGH (ref 70–99)
Glucose-Capillary: 312 mg/dL — ABNORMAL HIGH (ref 70–99)

## 2024-07-02 LAB — SODIUM
Sodium: 142 mmol/L (ref 135–145)
Sodium: 139 mmol/L (ref 135–145)
Sodium: 138 mmol/L (ref 135–145)

## 2024-07-02 NOTE — TOC Initial Note (Signed)
 Transition of Care [facility]) - Initial/Assessment Note    Patient Details  Name: Eric Cummings MRN: [id number] Date of Birth: 1966/05/05  Transition of Care [facility]) CM/SW Contact:    Arlana JINNY Nicholaus ISRAEL Phone Number: [phone] 07/02/2024, 10:26 AM  Clinical Narrative:      CSW spoke with the patients brother, Lamar over the phone. Lamar stated that the patient lives at home with his sister Bascom who is also at bedside. Patient is coming from [facility]. Patient has no curren Hh services. Patient does not use any DME. Lamar stated that he hopes that the dispo plan is for the patient to return back once medically ready. Lamar stated that the patient was currently setup with Respite services. Family will continue to provide support for patient.   Unit CSW/CM will continue to follow and monitor for dc readiness.              Expected Discharge Plan: Skilled Nursing Facility ([facility]) Barriers to Discharge: Continued Medical Work up   Patient Goals and CMS Choice Patient states their goals for this hospitalization and ongoing recovery are:: rehab CMS Medicare.gov Compare Post Acute Care list provided to:: Patient Represenative (must comment) (Sibling) Choice offered to / list presented to : Sibling [place] ownership interest in [facility].provided to:: Sibling    Expected Discharge Plan and Services     Post Acute Care Choice: Skilled Nursing Facility Living arrangements for the past 2 months: Single Family Home                                      Prior Living Arrangements/Services Living arrangements for the past 2 months: Single Family Home Lives with:: Siblings Patient language and need for interpreter reviewed:: Yes Do you feel safe going back to the place where you live?: Yes      Need for Family Participation in Patient Care: Yes (Comment) Care giver support system in place?: Yes (comment)   Criminal Activity/Legal Involvement  Pertinent to Current Situation/Hospitalization: No - Comment as needed  Activities of Daily Living   ADL Screening (condition at time of admission) Independently performs ADLs?: No Does the patient have a NEW difficulty with bathing/dressing/toileting/self-feeding that is expected to last >3 days?: Yes (Initiates electronic notice to provider for possible OT consult) Does the patient have a NEW difficulty with getting in/out of bed, walking, or climbing stairs that is expected to last >3 days?: Yes (Initiates electronic notice to provider for possible PT consult) Does the patient have a NEW difficulty with communication that is expected to last >3 days?: No Is the patient deaf or have difficulty hearing?: No Does the patient have difficulty seeing, even when wearing glasses/contacts?: No Does the patient have difficulty concentrating, remembering, or making decisions?: No  Permission Sought/Granted Permission sought to share information with : Family Supports, Case Manager, PCP, Magazine Features Editor Permission granted to share information with : No  Share Information with NAME: RUBE, SANCHEZ [phone] / [phone]  Permission granted to share info w AGENCY: SNFs        Emotional Assessment Appearance:: Appears stated age Attitude/Demeanor/Rapport: Unable to Assess Affect (typically observed): Unable to Assess   Alcohol / Substance Use: Not Applicable Psych Involvement: No (comment)  Admission diagnosis:  Hypernatremia [E87.0] AKI (acute kidney injury) [N17.9] Altered mental status, unspecified altered mental status type [R41.82] AMS (altered mental status) [R41.82] Patient Active Problem List  Diagnosis Date Noted   Acute metabolic encephalopathy 06/30/2024   Pressure injury of skin 06/30/2024   AKI (acute kidney injury) 06/29/2024   Hypernatremia 06/29/2024   AMS (altered mental status) 06/28/2024   Atrial fibrillation with RVR (HCC) 06/15/2024    Acute CVA (cerebrovascular accident) (HCC) 06/15/2024   Urinary incontinence 10/07/2021   Nocturia 05/15/2021   Encounter for screening for other disorder 04/04/2018   Diabetic peripheral neuropathy associated with type 2 diabetes mellitus (HCC) 01/08/2016   Diabetic renal disease (HCC) 01/08/2016   Adult failure to thrive syndrome 08/16/2015   Constipation 05/17/2015   Polyneuropathy 05/17/2015   Tinea unguium 05/17/2015   Cardiac arrhythmia 01/25/2015   Long term (current) use of insulin  (HCC) 01/25/2015   Abnormal gait 09/14/2014   Type 1 diabetes mellitus without complications (HCC) 09/14/2014   Cough 11/30/2013   Syncope and collapse 08/21/2013   Impacted cerumen 07/29/2012   Underimmunization status 03/01/2012   Otalgia 11/27/2011   Gastro-esophageal reflux disease without esophagitis 06/20/2010   Allergic rhinitis 02/28/2010   Dental caries 06/07/2009   Hyperglycemia due to type 2 diabetes mellitus (HCC) 06/07/2009   Autistic disorder 05/28/2009   Dermatitis 05/28/2009   Essential hypertension 05/28/2009   Hyperlipidemia 05/28/2009   Obesity 05/28/2009   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, [place] - [address] AT [facility] [address] [place] [address] Phone: [phone] Fax: [phone]  [facility] - [place], [place] - [address] [place] [address] Phone: [phone] Fax: [phone]     Social Drivers of Health (SDOH) Social History: SDOH Screenings   Food Insecurity: Patient Unable To Answer (06/29/2024)  Housing: Unknown (06/30/2024)  Transportation Needs: Patient Unable To Answer (06/29/2024)  Utilities: Patient Unable To Answer (06/29/2024)  Tobacco Use: Low Risk (06/28/2024)   SDOH Interventions:     Readmission Risk Interventions     No data to display

## 2024-07-02 NOTE — Evaluation (Signed)
 Physical Therapy Evaluation Patient Details Name: Eric Cummings MRN: [id number] DOB: Dec 22, 1965 Today's Date: 07/02/2024  History of Present Illness  59 y.o. male presents to [facility] 1/28 with AMS, BG 550 all day with BP 76/52. Of note, pt brought to ED 1/24 as well after a fall at [facility] in which he struck the R side of his head on the floor. Pt admitted for management of acute metabolic encephalopathy due to hypovolemic hypernatremia and severe dehydration. 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  Clinical Impression  Pt in bed upon arrival of PT, agreeable to evaluation at this time. Prior to admission the pt was mobilizing with staff assist and use of WC at facility, active with rehab, and receiving assist from staff for ADLs. The pt required mod-maxA to complete transfers and seated balance. Pt with strong posterior lean, intermittent R pushing and poor ability to initiate corrections without assistance. Pt also needing maxA of 2 to complete sit-stand x2 in session, unable to progress to initiating steps at this time. Will continue to benefit from skilled PT acutely as well as return to post-acute rehab <3hours/day once medically cleared.     If plan is discharge home, recommend the following: A lot of help with walking and/or transfers;A lot of help with bathing/dressing/bathroom;Assistance with cooking/housework;Assist for transportation;Help with stairs or ramp for entrance;Direct supervision/assist for medications management;Direct supervision/assist for financial management   Can travel by private vehicle   No    Equipment Recommendations Rolling walker (2 wheels);Wheelchair cushion (measurements PT);Wheelchair (measurements PT);BSC/3in1  Recommendations for Other Services       Functional Status Assessment Patient has had a recent decline in their functional status and demonstrates the ability to make significant  improvements in function in a reasonable and predictable amount of time.     Precautions / Restrictions Precautions Precautions: Fall Recall of Precautions/Restrictions: Impaired Precaution/Restrictions Comments: B AFOs Restrictions Weight Bearing Restrictions Per Provider Order: No      Mobility  Bed Mobility Overal bed mobility: Needs Assistance Bed Mobility: Supine to Sit, Sit to Supine     Supine to sit: Mod assist, +2 for physical assistance Sit to supine: Mod assist, +2 for physical assistance   General bed mobility comments: unable to move RLE without assistance, modA to trunk to elevate and maintain balance    Transfers Overall transfer level: Needs assistance Equipment used: 2 person hand held assist Transfers: Sit to/from Stand Sit to Stand: Mod assist, Max assist, +2 physical assistance           General transfer comment: assist to power up, maintain trunk extension and faciliate hip extension. blocking R knee. poor standing tolerance to maintain upright without significant assist, unable to manage stepping    Ambulation/Gait               General Gait Details: unable to manage stepping      Balance Overall balance assessment: Needs assistance, Mild deficits observed, not formally tested Sitting-balance support: Feet supported, Bilateral upper extremity supported Sitting balance-Leahy Scale: Zero Sitting balance - Comments: mod-maxA to maintain sitting balance, posterior lean with intermittent pushing to R Postural control: Posterior lean, Right lateral lean Standing balance support: Bilateral upper extremity supported, During functional activity, Reliant on assistive device for balance Standing balance-Leahy Scale: Poor Standing balance comment: mod-maxA of 2  Pertinent Vitals/Pain Pain Assessment Pain Assessment: No/denies pain Pain Intervention(s): Monitored during session    Home Living  Family/patient expects to be discharged to:: Skilled nursing facility                   Additional Comments: pt was at [facility] for STR PTA. Had been having +2 assist to transfer to w/c. Had been feeding and grooming self with L hand since L ACA CVA at beginning of January.    Prior Function Prior Level of Function : Needs assist;History of Falls (last six months)             Mobility Comments: per sister, pt transferred with use of single staff member at SNF, was active with rehab, but unsure what pt was able to do ADLs Comments: assist with ADLs, is able to feed himself     Extremity/Trunk Assessment   Upper Extremity Assessment Upper Extremity Assessment: Defer to OT evaluation    Lower Extremity Assessment Lower Extremity Assessment: RLE deficits/detail RLE Deficits / Details: no active movement in RLE, 3-5 beats clonus with ankle DF. RLE Sensation: decreased light touch RLE Coordination: decreased fine motor;decreased gross motor    Cervical / Trunk Assessment Cervical / Trunk Assessment: Other exceptions Cervical / Trunk Exceptions: weakness  Communication   Communication Communication: Impaired Factors Affecting Communication: Difficulty expressing self (non-verbal at baseline)    Cognition Arousal: Alert Behavior During Therapy: WFL for tasks assessed/performed   PT - Cognitive impairments: History of cognitive impairments                       PT - Cognition Comments: nonverbal at baseline, will use thumbs up and occasionally follow commands with increased time Following commands: Impaired Following commands impaired: Follows one step commands inconsistently, Follows one step commands with increased time     Cueing Cueing Techniques: Verbal cues, Tactile cues, Visual cues     General Comments General comments (skin integrity, edema, etc.): VSS on RA, sister present    Exercises     Assessment/Plan    PT Assessment Patient needs  continued PT services  PT Problem List Decreased strength;Decreased mobility;Decreased activity tolerance;Decreased balance;Decreased cognition;Decreased knowledge of use of DME;Decreased safety awareness       PT Treatment Interventions DME instruction;Gait training;Stair training;Functional mobility training;Therapeutic activities;Therapeutic exercise;Balance training;Neuromuscular re-education;Patient/family education    PT Goals (Current goals can be found in the Care Plan section)  Acute Rehab PT Goals Patient Stated Goal: per family, to progress independence and walking PT Goal Formulation: With patient/family Time For Goal Achievement: 07/16/24 Potential to Achieve Goals: Good    Frequency Min 3X/week     Co-evaluation PT/OT/SLP Co-Evaluation/Treatment: Yes Reason for Co-Treatment: For patient/therapist safety;To address functional/ADL transfers;Necessary to address cognition/behavior during functional activity;Complexity of the patient's impairments (multi-system involvement) PT goals addressed during session: Mobility/safety with mobility;Balance;Proper use of DME OT goals addressed during session: ADL's and self-care       AM-PAC PT 6 Clicks Mobility  Outcome Measure Help needed turning from your back to your side while in a flat bed without using bedrails?: A Lot Help needed moving from lying on your back to sitting on the side of a flat bed without using bedrails?: Total Help needed moving to and from a bed to a chair (including a wheelchair)?: Total Help needed standing up from a chair using your arms (e.g., wheelchair or bedside chair)?: Total Help needed to walk in hospital room?: Total Help needed climbing  3-5 steps with a railing? : Total 6 Click Score: 7    End of Session Equipment Utilized During Treatment: Gait belt Activity Tolerance: Patient tolerated treatment well Patient left: in bed;with call bell/phone within reach;with family/visitor present Nurse  Communication: Mobility status PT Visit Diagnosis: Unsteadiness on feet (R26.81);Other abnormalities of gait and mobility (R26.89);Muscle weakness (generalized) (M62.81);History of falling (Z91.81)    Time: 1020-1046 PT Time Calculation (min) (ACUTE ONLY): 26 min   Charges:   PT Evaluation $PT Eval Low Complexity: 1 Low   PT General Charges $$ ACUTE PT VISIT: 1 Visit         Izetta Call, PT, DPT   Acute Rehabilitation Department Office [phone] Secure Chat Communication Preferred  Izetta JULIANNA Call 07/02/2024, 2:09 PM

## 2024-07-02 NOTE — Progress Notes (Signed)
 " PROGRESS NOTE    Eric Cummings  FMW:[id number] DOB: 1966-02-11 DOA: 06/28/2024 PCP: Onita Rush, MD    Brief Narrative:  59 year old gentleman who was recently discharged to a skilled nursing facility after suffering acute stroke, found to be encephalopathic and hypotensive at nursing home so sent to the ER.  Initially patient with blood pressure 70s, blood glucose more than 500, hypernatremic.  Patient with baseline dysarthria.  Patient was given IV fluids, did not respond to fluid resuscitation and needed vasopressors so admitted to ICU.  Patient with poor oral intake.  He does have a history of autism, he is nonverbal at baseline.  Recent stroke.  Chronic A-fib on Eliquis .  Type 2 diabetes on insulin . Patient lives at home and previously independent.  His brother and sister help him at home.  2 weeks ago he was in the hospital after falling at daycare program, he had in-house stroke.  Found to have new onset A-fib and started on Eliquis . Patient was discharged to a skilled nursing facility.  Subjective: Patient seen and examined.  His sister was feeding him.  Patient was alert and interactive.  Sister reported his mental status is back to baseline when he is alert but he is more sleepy than usual.  Also reported coughing after eating.  Seen by speech therapy. Sodium has been stabilized without additional IV fluids for last 48 hours. Family desires patient to go back to SNF.  Medically stable to go back to SNF when bed available.   Assessment & Plan:   Acute metabolic encephalopathy due to hypovolemic hypernatremia and severe dehydration, severe free water  deficit. Presented with sodium of 164 and treated with hypotonic fluid, gradually improving and normalized. Sodium 142. Now encouraging oral intake.   Mental status is improving.  Has autism at baseline and nonverbal. Repeat CT head was done that was without any acute findings. Encourage water  intake.  200 mL every 2  hours.  History of stroke, chronic anticoagulation.  Right hemiparesis.  Consult PT OT and start mobilizing.  Refer back to a skilled nursing facility.  Care staffs need to ensure water  access to this patient.    DVT prophylaxis: SCDs Start: 06/28/24 2115 apixaban  (ELIQUIS ) tablet 5 mg   Code Status: Full code Family Communication: Sister at the bedside Disposition Plan: Status is: Inpatient Remains inpatient appropriate because: Medically stabilized.  Needs therapy evaluation and referring back to skilled nursing facility for short-term rehab.     Consultants:  Critical care  Procedures:  None  Antimicrobials:  None     Objective: Vitals:   07/01/24 1621 07/01/24 2000 07/02/24 0500 07/02/24 0835  BP: 126/63 138/61  137/77  Pulse: (!) 59 61  74  Resp: 15 16  17   Temp: 98.5 F (36.9 C) 99.1 F (37.3 C)    TempSrc:  Oral    SpO2: 97% 98%  98%  Weight:   70 kg     Intake/Output Summary (Last 24 hours) at 07/02/2024 1047 Last data filed at 07/01/2024 1821 Gross per 24 hour  Intake 800 ml  Output 510 ml  Net 290 ml   Filed Weights   06/29/24 0500 07/01/24 0444 07/02/24 0500  Weight: 70.6 kg 70.4 kg 70 kg    Examination:  General exam: Appears calm and comfortable .  Cheerful on interaction.  Dysarthric. Respiratory system: Clear to auscultation. Respiratory effort normal. On room air. Cardiovascular system: S1 & S2 heard, RRR.  No pedal edema. Gastrointestinal system: Abdomen is nondistended, soft  and nontender. No organomegaly or masses felt. Normal bowel sounds heard. Central nervous system: Alert while awake.  Dysarthric.  Patient does have hemiparesis right upper and lower extremities.  Right upper extremity 4/5.   Data Reviewed: I have personally reviewed following labs and imaging studies  CBC: Recent Labs  Lab 06/28/24 1848 06/28/24 1858 06/28/24 1859 06/29/24 0243 06/30/24 1308 07/01/24 0717  WBC 14.7*  --   --  18.8* 8.0 7.2  NEUTROABS  13.2*  --   --   --   --   --   HGB 15.6 16.7 16.3 15.5 13.0 12.4*  HCT 52.1* 49.0 48.0 51.3 41.8 38.9*  MCV 94.4  --   --  92.3 90.7 87.0  PLT 214  --   --  192 132* 122*   Basic Metabolic Panel: Recent Labs  Lab 06/28/24 2154 06/29/24 0243 06/29/24 0737 06/30/24 0402 06/30/24 0926 07/01/24 0717 07/01/24 1035 07/01/24 1435 07/01/24 1822 07/01/24 2148 07/02/24 0414 07/02/24 0934  NA 156* 162*   < > 151*   < > 149*   < > 140 141 143 143 142  K 5.1 5.1  --  4.1  --  3.8  --   --   --   --  4.0  --   CL 126* 128*  --  115*  --  116*  --   --   --   --  112*  --   CO2 18* 22  --  22  --  22  --   --   --   --  20*  --   GLUCOSE 405* 374*  --  257*  --  142*  --   --   --   --  157*  --   BUN 94* 90*  --  39*  --  27*  --   --   --   --  22*  --   CREATININE 2.18* 2.12*  --  1.23  --  0.97  --   --   --   --  0.94  --   CALCIUM 8.2* 8.8*  --  8.6*  --  8.7*  --   --   --   --  8.5*  --   MG 2.4 2.6*  --   --   --   --   --   --   --   --   --   --    < > = values in this interval not displayed.   GFR: Estimated Creatinine Clearance: 84.8 mL/min (by C-G formula based on SCr of 0.94 mg/dL). Liver Function Tests: Recent Labs  Lab 06/28/24 1848  AST 47*  ALT 42  ALKPHOS 81  BILITOT 0.4  PROT 6.2*  ALBUMIN  3.3*   No results for input(s): LIPASE, AMYLASE in the last 168 hours. No results for input(s): AMMONIA in the last 168 hours. Coagulation Profile: No results for input(s): INR, PROTIME in the last 168 hours. Cardiac Enzymes: No results for input(s): CKTOTAL, CKMB, CKMBINDEX, TROPONINI in the last 168 hours. BNP (last 3 results) No results for input(s): PROBNP in the last 8760 hours. HbA1C: No results for input(s): HGBA1C in the last 72 hours. CBG: Recent Labs  Lab 07/01/24 1544 07/01/24 2012 07/02/24 0002 07/02/24 0411 07/02/24 0750  GLUCAP 315* 243* 225* 141* 135*   Lipid Profile: No results for input(s): CHOL, HDL, LDLCALC,  TRIG, CHOLHDL, LDLDIRECT in the last 72 hours. Thyroid  Function Tests: No results  for input(s): TSH, T4TOTAL, FREET4, T3FREE, THYROIDAB in the last 72 hours.  Anemia Panel: No results for input(s): VITAMINB12, FOLATE, FERRITIN, TIBC, IRON, RETICCTPCT in the last 72 hours. Sepsis Labs: Recent Labs  Lab 06/28/24 2203 06/29/24 0737 06/29/24 1128 06/30/24 0926  LATICACIDVEN 4.0* 4.0* 3.5* 1.7    Recent Results (from the past 240 hours)  Resp panel by RT-PCR (RSV, Flu A&B, Covid) Anterior Nasal Swab     Status: None   Collection Time: 06/28/24  5:36 PM   Specimen: Anterior Nasal Swab  Result Value Ref Range Status   SARS Coronavirus 2 by RT PCR NEGATIVE NEGATIVE Final   Influenza A by PCR NEGATIVE NEGATIVE Final   Influenza B by PCR NEGATIVE NEGATIVE Final    Comment: (NOTE) The Xpert Xpress SARS-CoV-2/FLU/RSV plus assay is intended as an aid in the diagnosis of influenza from Nasopharyngeal swab specimens and should not be used as a sole basis for treatment. Nasal washings and aspirates are unacceptable for Xpert Xpress SARS-CoV-2/FLU/RSV testing.  Fact Sheet for Patients: bloggercourse.com  Fact Sheet for Healthcare Providers: seriousbroker.it  This test is not yet approved or cleared by the [place]  FDA and has been authorized for detection and/or diagnosis of SARS-CoV-2 by FDA under an Emergency Use Authorization (EUA). This EUA will remain in effect (meaning this test can be used) for the duration of the COVID-19 declaration under Section 564(b)(1) of the Act, 21 U.S.C. section 360bbb-3(b)(1), unless the authorization is terminated or revoked.     Resp Syncytial Virus by PCR NEGATIVE NEGATIVE Final    Comment: (NOTE) Fact Sheet for Patients: bloggercourse.com  Fact Sheet for Healthcare Providers: seriousbroker.it  This test is not yet  approved or cleared by the [place]  FDA and has been authorized for detection and/or diagnosis of SARS-CoV-2 by FDA under an Emergency Use Authorization (EUA). This EUA will remain in effect (meaning this test can be used) for the duration of the COVID-19 declaration under Section 564(b)(1) of the Act, 21 U.S.C. section 360bbb-3(b)(1), unless the authorization is terminated or revoked.  Performed at [facility], [address] [place], [place] [address]   Culture, blood (Routine X 2) w Reflex to ID Panel     Status: None (Preliminary result)   Collection Time: 06/28/24  6:00 PM   Specimen: BLOOD LEFT WRIST  Result Value Ref Range Status   Specimen Description BLOOD LEFT WRIST  Final   Special Requests   Final    BOTTLES DRAWN AEROBIC AND ANAEROBIC Blood Culture results may not be optimal due to an inadequate volume of blood received in culture bottles   Culture   Final    NO GROWTH 4 DAYS Performed at [facility], [address], [place], [place] [address]    Report Status PENDING  Incomplete  Culture, blood (Routine X 2) w Reflex to ID Panel     Status: None (Preliminary result)   Collection Time: 06/28/24  6:49 PM   Specimen: BLOOD RIGHT HAND  Result Value Ref Range Status   Specimen Description BLOOD RIGHT HAND  Final   Special Requests   Final    BOTTLES DRAWN AEROBIC ONLY Blood Culture results may not be optimal due to an inadequate volume of blood received in culture bottles   Culture   Final    NO GROWTH 4 DAYS Performed at [facility], [address], [place], [place] [address]    Report Status PENDING  Incomplete  MRSA Next  Gen by PCR, Nasal     Status: None   Collection Time: 06/28/24  9:15 PM   Specimen: Nasal Mucosa; Nasal Swab  Result Value Ref Range Status   MRSA by PCR Next Gen NOT DETECTED NOT DETECTED Final    Comment: (NOTE) The GeneXpert MRSA Assay (FDA approved for NASAL specimens only), is one component of a comprehensive MRSA  colonization surveillance program. It is not intended to diagnose MRSA infection nor to guide or monitor treatment for MRSA infections. Test performance is not FDA approved in patients less than 65 years old. Performed at [facility], [address], [place], [place] [address]          Radiology Studies: No results found.      Scheduled Meds:  apixaban   5 mg Oral BID   Chlorhexidine  Gluconate Cloth  6 each Topical Q0600   feeding supplement (GLUCERNA SHAKE)  237 mL Oral TID BM   free water   200 mL Per Tube Q2H   insulin  aspart  0-9 Units Subcutaneous Q4H   insulin  glargine-yfgn  12 Units Subcutaneous Daily   metoprolol  tartrate  12.5 mg Oral BID   mouth rinse  15 mL Mouth Rinse 4 times per day   pantoprazole   40 mg Oral Daily   Continuous Infusions:   LOS: 4 days       Renato Applebaum, MD [facility]   "

## 2024-07-02 NOTE — Evaluation (Signed)
 Occupational Therapy Evaluation Patient Details Name: Eric Cummings MRN: [id number] DOB: February 15, 1966 Today's Date: 07/02/2024   History of Present Illness   59 y.o. male presents to [facility] 1/28 with AMS, BG 550 all day with BP 76/52. Of note, pt brought to ED 1/24 as well after a fall at [facility] in which he struck the R side of his head on the floor. Pt admitted for management of acute metabolic encephalopathy due to hypovolemic hypernatremia and severe dehydration. 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     Clinical Impressions Pt seen for OT evaluation this AM. Supportive sister at bedside, assisting with PLOF at SNF. Per notes, pt from [facility] for rehab PTA. Sister unsure what pt was able to do in rehab, states he could still feed and groom himself, but was having some assist for ADLs since stroke ~2 weeks ago. Non-verbal at baseline. Functionally, pt required max A +2 to stand and max A mostly for LB ADLs, min-mod A for UB self-care. Incontinent of stool while standing. VSS. RUE with some movement in fingers, no other active movement noted. Does initiate tasks fairly well with verbal cues & visual demonstration.  Pt is currently functioning below baseline and would benefit from ongoing acute OT services to progress towards safe discharge and to facilitate return to prior level of function. Current recommendation is post-acute rehab (< 3 hours/day).     If plan is discharge home, recommend the following:   Two people to help with walking and/or transfers;Two people to help with bathing/dressing/bathroom;Assistance with feeding;Direct supervision/assist for medications management;Direct supervision/assist for financial management;Assist for transportation;Help with stairs or ramp for entrance;Supervision due to cognitive status     Functional Status Assessment   Patient has had a recent decline in their functional  status and demonstrates the ability to make significant improvements in function in a reasonable and predictable amount of time.     Equipment Recommendations   Other (comment) (defer to next level of care)     Recommendations for Other Services         Precautions/Restrictions   Precautions Precautions: Fall Recall of Precautions/Restrictions: Impaired Precaution/Restrictions Comments: B AFOs Restrictions Weight Bearing Restrictions Per Provider Order: No     Mobility Bed Mobility Overal bed mobility: Needs Assistance Bed Mobility: Supine to Sit, Sit to Supine     Supine to sit: Mod assist, +2 for physical assistance Sit to supine: Mod assist, +2 for physical assistance   General bed mobility comments: assist to manage RHB towards EOB, exited to the L side, assist for trunk elevation & sequencing    Transfers Overall transfer level: Needs assistance Equipment used: 2 person hand held assist Transfers: Sit to/from Stand Sit to Stand: Mod assist, Max assist, +2 physical assistance           General transfer comment: stood from EOB with B knees blocked, extensive cues for forward gaze & upright, unable to attempt stepping along EOB      Balance Overall balance assessment: Needs assistance Sitting-balance support: Single extremity supported, Feet supported Sitting balance-Leahy Scale: Poor Sitting balance - Comments: mod/max A to maintain, pt leaning posteriorly and to the R with LUE extended, sits more in midline with LUE not extended on mattress Postural control: Posterior lean, Right lateral lean Standing balance support: Bilateral upper extremity supported, During functional activity Standing balance-Leahy Scale: Zero Standing balance comment: max A +2 to maintain upright stance  ADL either performed or assessed with clinical judgement   ADL Overall ADL's : Needs assistance/impaired Eating/Feeding: Minimal  assistance;Bed level   Grooming: Minimal assistance           Upper Body Dressing : Moderate assistance Upper Body Dressing Details (indicate cue type and reason): cues for sequencing and threading RUE through gown, benefits from visual demonstration Lower Body Dressing: Total assistance       Toileting- Clothing Manipulation and Hygiene: Total assistance Toileting - Clothing Manipulation Details (indicate cue type and reason): pt incontinent of bowel upon standing, needs total A for peri hygiene; sat on bedpan at EOB     Functional mobility during ADLs: Maximal assistance;+2 for physical assistance;+2 for safety/equipment       Vision Baseline Vision/History: 0 No visual deficits (per sister pt does not wear glasses) Patient Visual Report: No change from baseline       Perception Perception: Not tested       Praxis         Pertinent Vitals/Pain Pain Assessment Pain Assessment: Faces Faces Pain Scale: No hurt     Extremity/Trunk Assessment Upper Extremity Assessment Upper Extremity Assessment: RUE deficits/detail RUE Deficits / Details: pt can make fist, grip strength ~2+/5, no active movement noted at elbow/shoulder, limited assessment given cog/communication deficits and poor command following; did not observe pt spontaneously move RUE RUE Sensation: decreased light touch RUE Coordination: decreased fine motor;decreased gross motor   Lower Extremity Assessment Lower Extremity Assessment: Defer to PT evaluation RLE Deficits / Details: no active movement in RLE, 3-5 beats clonus with ankle DF. RLE Sensation: decreased light touch RLE Coordination: decreased fine motor;decreased gross motor   Cervical / Trunk Assessment Cervical / Trunk Assessment: Other exceptions Cervical / Trunk Exceptions: weakness   Communication Communication Communication: Impaired Factors Affecting Communication: Difficulty expressing self (non-verbal at baseline per sister and  chart)   Cognition Arousal: Alert Behavior During Therapy: WFL for tasks assessed/performed Cognition: History of cognitive impairments (ASD per chart)             OT - Cognition Comments: pt pleasant & participatory; eager to mobilize                 Following commands: Impaired Following commands impaired: Follows one step commands inconsistently, Follows one step commands with increased time     Cueing  General Comments   Cueing Techniques: Verbal cues;Tactile cues;Visual cues  VSS on RA; supportive sister present   Exercises     Shoulder Instructions      Home Living Family/patient expects to be discharged to:: Skilled nursing facility                                 Additional Comments: pt was at [facility] for STR PTA. Had been having +2 assist to transfer to w/c. Had been feeding and grooming self with L hand since L ACA CVA at beginning of January.      Prior Functioning/Environment Prior Level of Function : Needs assist;History of Falls (last six months)       Physical Assist : Mobility (physical);ADLs (physical) Mobility (physical): Bed mobility;Transfers;Gait ADLs (physical): Bathing;Toileting;IADLs;Dressing Mobility Comments: per sister, pt transferred with use of 1-2 staff members at SNF, was active with rehab, but unsure what pt was able to do ADLs Comments: assist with ADLs, is able to feed and groom himself    OT Problem List: Decreased strength;Impaired balance (sitting  and/or standing);Decreased coordination;Decreased cognition;Decreased knowledge of use of DME or AE;Impaired sensation;Impaired UE functional use   OT Treatment/Interventions: Self-care/ADL training;DME and/or AE instruction;Therapeutic activities;Patient/family education;Balance training;Neuromuscular education      OT Goals(Current goals can be found in the care plan section)   Acute Rehab OT Goals Patient Stated Goal: did not state; sister stated goal  is to return to SNF for rehab OT Goal Formulation: With family Time For Goal Achievement: 07/16/24 Potential to Achieve Goals: Fair   OT Frequency:  Min 1X/week    Co-evaluation PT/OT/SLP Co-Evaluation/Treatment: Yes Reason for Co-Treatment: For patient/therapist safety;To address functional/ADL transfers;Necessary to address cognition/behavior during functional activity;Complexity of the patient's impairments (multi-system involvement) PT goals addressed during session: Mobility/safety with mobility;Balance;Proper use of DME OT goals addressed during session: ADL's and self-care      AM-PAC OT 6 Clicks Daily Activity     Outcome Measure Help from another person eating meals?: A Little Help from another person taking care of personal grooming?: A Lot Help from another person toileting, which includes using toliet, bedpan, or urinal?: Total Help from another person bathing (including washing, rinsing, drying)?: A Lot Help from another person to put on and taking off regular upper body clothing?: A Lot Help from another person to put on and taking off regular lower body clothing?: Total 6 Click Score: 11   End of Session Equipment Utilized During Treatment: Gait belt Nurse Communication: Mobility status  Activity Tolerance: Patient tolerated treatment well Patient left: in bed;with call bell/phone within reach;with family/visitor present;with bed alarm set;with nursing/sitter in room  OT Visit Diagnosis: Unsteadiness on feet (R26.81);Other abnormalities of gait and mobility (R26.89);Muscle weakness (generalized) (M62.81);Other symptoms and signs involving cognitive function                Time: 8980-8953 OT Time Calculation (min): 27 min Charges:  OT General Charges $OT Visit: 1 Visit OT Evaluation $OT Eval Moderate Complexity: 1 Mod OT Treatments $Self Care/Home Management : 8-22 mins  Eric Cummings, OTR/L [facility] Acute Rehabilitation Services [phone] Secure Chat  Preferred  Eric Cummings 07/02/2024, 3:07 PM

## 2024-07-02 NOTE — Plan of Care (Signed)
  Problem: Education: Goal: Ability to describe self-care measures that may prevent or decrease complications (Diabetes Survival Skills Education) will improve Outcome: Progressing Goal: Individualized Educational Video(s) Outcome: Progressing   Problem: Coping: Goal: Ability to adjust to condition or change in health will improve Outcome: Progressing   Problem: Fluid Volume: Goal: Ability to maintain a balanced intake and output will improve Outcome: Progressing   Problem: Health Behavior/Discharge Planning: Goal: Ability to identify and utilize available resources and services will improve Outcome: Progressing Goal: Ability to manage health-related needs will improve Outcome: Progressing   Problem: Metabolic: Goal: Ability to maintain appropriate glucose levels will improve Outcome: Progressing   Problem: Nutritional: Goal: Maintenance of adequate nutrition will improve Outcome: Progressing Goal: Progress toward achieving an optimal weight will improve Outcome: Progressing   Problem: Skin Integrity: Goal: Risk for impaired skin integrity will decrease Outcome: Progressing   Problem: Tissue Perfusion: Goal: Adequacy of tissue perfusion will improve Outcome: Progressing   Problem: Education: Goal: Knowledge of General Education information will improve Description: Including pain rating scale, medication(s)/side effects and non-pharmacologic comfort measures Outcome: Progressing   Problem: Health Behavior/Discharge Planning: Goal: Ability to manage health-related needs will improve Outcome: Progressing   Problem: Clinical Measurements: Goal: Ability to maintain clinical measurements within normal limits will improve Outcome: Progressing Goal: Will remain free from infection Outcome: Progressing Goal: Diagnostic test results will improve Outcome: Progressing Goal: Respiratory complications will improve Outcome: Progressing Goal: Cardiovascular complication will  be avoided Outcome: Progressing   Problem: Activity: Goal: Risk for activity intolerance will decrease Outcome: Progressing   Problem: Nutrition: Goal: Adequate nutrition will be maintained Outcome: Progressing   Problem: Coping: Goal: Level of anxiety will decrease Outcome: Progressing   Problem: Elimination: Goal: Will not experience complications related to bowel motility Outcome: Progressing Goal: Will not experience complications related to urinary retention Outcome: Progressing   Problem: Pain Managment: Goal: General experience of comfort will improve and/or be controlled Outcome: Progressing   Problem: Safety: Goal: Ability to remain free from injury will improve Outcome: Progressing   Problem: Skin Integrity: Goal: Risk for impaired skin integrity will decrease Outcome: Progressing   Problem: Education: Goal: Ability to describe self-care measures that may prevent or decrease complications (Diabetes Survival Skills Education) will improve Outcome: Progressing Goal: Individualized Educational Video(s) Outcome: Progressing   Problem: Cardiac: Goal: Ability to maintain an adequate cardiac output will improve Outcome: Progressing   Problem: Health Behavior/Discharge Planning: Goal: Ability to identify and utilize available resources and services will improve Outcome: Progressing Goal: Ability to manage health-related needs will improve Outcome: Progressing   Problem: Fluid Volume: Goal: Ability to achieve a balanced intake and output will improve Outcome: Progressing   Problem: Metabolic: Goal: Ability to maintain appropriate glucose levels will improve Outcome: Progressing   Problem: Nutritional: Goal: Maintenance of adequate nutrition will improve Outcome: Progressing Goal: Maintenance of adequate weight for body size and type will improve Outcome: Progressing   Problem: Respiratory: Goal: Will regain and/or maintain adequate  ventilation Outcome: Progressing   Problem: Urinary Elimination: Goal: Ability to achieve and maintain adequate renal perfusion and functioning will improve Outcome: Progressing

## 2024-07-03 DIAGNOSIS — G9341 Metabolic encephalopathy: Secondary | ICD-10-CM | POA: Diagnosis not present

## 2024-07-03 LAB — CULTURE, BLOOD (ROUTINE X 2)
Culture: NO GROWTH
Culture: NO GROWTH

## 2024-07-03 LAB — BASIC METABOLIC PANEL WITH GFR
Anion gap: 8 (ref 5–15)
BUN: 18 mg/dL (ref 6–20)
CO2: 23 mmol/L (ref 22–32)
Calcium: 8.1 mg/dL — ABNORMAL LOW (ref 8.9–10.3)
Chloride: 104 mmol/L (ref 98–111)
Creatinine, Ser: 0.85 mg/dL (ref 0.61–1.24)
GFR, Estimated: >60 mL/min
Glucose, Bld: 191 mg/dL — ABNORMAL HIGH (ref 70–99)
Potassium: 3.5 mmol/L (ref 3.5–5.1)
Sodium: 136 mmol/L (ref 135–145)

## 2024-07-03 LAB — GLUCOSE, CAPILLARY
Glucose-Capillary: 173 mg/dL — ABNORMAL HIGH (ref 70–99)
Glucose-Capillary: 185 mg/dL — ABNORMAL HIGH (ref 70–99)
Glucose-Capillary: 189 mg/dL — ABNORMAL HIGH (ref 70–99)
Glucose-Capillary: 194 mg/dL — ABNORMAL HIGH (ref 70–99)
Glucose-Capillary: 350 mg/dL — ABNORMAL HIGH (ref 70–99)

## 2024-07-03 LAB — SODIUM
Sodium: 136 mmol/L (ref 135–145)
Sodium: 136 mmol/L (ref 135–145)

## 2024-07-03 MED ORDER — GUAIFENESIN-DM 100-10 MG/5ML PO SYRP
5.0000 mL | ORAL_SOLUTION | Freq: Once | ORAL | Status: AC
  Administered 2024-07-03: 5 mL via ORAL
  Filled 2024-07-03: qty 10

## 2024-07-03 NOTE — TOC Progression Note (Addendum)
 Transition of Care [facility]) - Progression Note    Patient Details  Name: Eric Cummings MRN: [id number] Date of Birth: 11-04-1965  Transition of Care [facility]) CM/SW Contact  Sherline Clack, [place] Phone Number: 07/03/2024, 10:32 AM  Clinical Narrative:     Update 11:40 AM: patient's shara was approved 2/2-2/4. Provider updated. [facility]ame able to take patient back today.  CSW submitted insurance auth for patient to return to [facility]. Shara is currently pending; auth ID: [id number]. CSW will continue to monitor auth and update DC plan.   Expected Discharge Plan: Skilled Nursing Facility ([facility]) Barriers to Discharge: Continued Medical Work up               Expected Discharge Plan and Services     Post Acute Care Choice: Skilled Nursing Facility Living arrangements for the past 2 months: Single Family Home                                       Social Drivers of Health (SDOH) Interventions SDOH Screenings   Food Insecurity: Patient Unable To Answer (06/29/2024)  Housing: Unknown (06/30/2024)  Transportation Needs: Patient Unable To Answer (06/29/2024)  Utilities: Patient Unable To Answer (06/29/2024)  Tobacco Use: Low Risk (06/28/2024)    Readmission Risk Interventions     No data to display

## 2024-07-03 NOTE — Plan of Care (Signed)
  Problem: Education: Goal: Ability to describe self-care measures that may prevent or decrease complications (Diabetes Survival Skills Education) will improve Outcome: Progressing Goal: Individualized Educational Video(s) Outcome: Progressing   Problem: Coping: Goal: Ability to adjust to condition or change in health will improve Outcome: Progressing   Problem: Fluid Volume: Goal: Ability to maintain a balanced intake and output will improve Outcome: Progressing   Problem: Health Behavior/Discharge Planning: Goal: Ability to identify and utilize available resources and services will improve Outcome: Progressing Goal: Ability to manage health-related needs will improve Outcome: Progressing   Problem: Metabolic: Goal: Ability to maintain appropriate glucose levels will improve Outcome: Progressing   Problem: Nutritional: Goal: Maintenance of adequate nutrition will improve Outcome: Progressing Goal: Progress toward achieving an optimal weight will improve Outcome: Progressing   Problem: Skin Integrity: Goal: Risk for impaired skin integrity will decrease Outcome: Progressing   Problem: Tissue Perfusion: Goal: Adequacy of tissue perfusion will improve Outcome: Progressing   Problem: Education: Goal: Knowledge of General Education information will improve Description: Including pain rating scale, medication(s)/side effects and non-pharmacologic comfort measures Outcome: Progressing   Problem: Health Behavior/Discharge Planning: Goal: Ability to manage health-related needs will improve Outcome: Progressing   Problem: Clinical Measurements: Goal: Ability to maintain clinical measurements within normal limits will improve Outcome: Progressing Goal: Will remain free from infection Outcome: Progressing Goal: Diagnostic test results will improve Outcome: Progressing Goal: Respiratory complications will improve Outcome: Progressing Goal: Cardiovascular complication will  be avoided Outcome: Progressing   Problem: Activity: Goal: Risk for activity intolerance will decrease Outcome: Progressing   Problem: Nutrition: Goal: Adequate nutrition will be maintained Outcome: Progressing   Problem: Coping: Goal: Level of anxiety will decrease Outcome: Progressing   Problem: Elimination: Goal: Will not experience complications related to bowel motility Outcome: Progressing Goal: Will not experience complications related to urinary retention Outcome: Progressing   Problem: Pain Managment: Goal: General experience of comfort will improve and/or be controlled Outcome: Progressing   Problem: Safety: Goal: Ability to remain free from injury will improve Outcome: Progressing   Problem: Skin Integrity: Goal: Risk for impaired skin integrity will decrease Outcome: Progressing   Problem: Education: Goal: Ability to describe self-care measures that may prevent or decrease complications (Diabetes Survival Skills Education) will improve Outcome: Progressing Goal: Individualized Educational Video(s) Outcome: Progressing   Problem: Cardiac: Goal: Ability to maintain an adequate cardiac output will improve Outcome: Progressing   Problem: Health Behavior/Discharge Planning: Goal: Ability to identify and utilize available resources and services will improve Outcome: Progressing Goal: Ability to manage health-related needs will improve Outcome: Progressing   Problem: Fluid Volume: Goal: Ability to achieve a balanced intake and output will improve Outcome: Progressing   Problem: Metabolic: Goal: Ability to maintain appropriate glucose levels will improve Outcome: Progressing   Problem: Nutritional: Goal: Maintenance of adequate nutrition will improve Outcome: Progressing Goal: Maintenance of adequate weight for body size and type will improve Outcome: Progressing   Problem: Respiratory: Goal: Will regain and/or maintain adequate  ventilation Outcome: Progressing   Problem: Urinary Elimination: Goal: Ability to achieve and maintain adequate renal perfusion and functioning will improve Outcome: Progressing

## 2024-07-03 NOTE — Progress Notes (Signed)
Report called to [facility].

## 2024-07-03 NOTE — Inpatient Diabetes Management (Signed)
 Inpatient Diabetes Program Recommendations  AACE/ADA: New Consensus Statement on Inpatient Glycemic Control (2015)  Target Ranges:  Prepandial:   less than 140 mg/dL      Peak postprandial:   less than 180 mg/dL (1-2 hours)      Critically ill patients:  140 - 180 mg/dL   Lab Results  Component Value Date   GLUCAP 185 (H) 07/03/2024   HGBA1C 8.7 (H) 06/15/2024    Review of Glycemic Control  Latest Reference Range & Units 07/03/24 00:30 07/03/24 00:33 07/03/24 04:16 07/03/24 07:58  Glucose-Capillary 70 - 99 mg/dL 810 (H) 805 (H) 826 (H) 185 (H)   Diabetes history: DM 2 Outpatient Diabetes medications:  Jardiance  10 mg daily Novolog  1-9 units tid with meals Basaglar  14 units q HS Current orders for Inpatient glycemic control:  Glucerna 237 ml tid between meals Lantus  12 units daily Novolog  0-9 units q 4 hours  Inpatient Diabetes Program Recommendations:    Consider changing Novolog  to tid with meals and HS.  Also consider adding Novolog  meal coverage 2-3 units tid with meals (meal coverage- hold if patient eats less than 50% or NPO).    Thanks,  Randall Bullocks, RN, BC-ADM Inpatient Diabetes Coordinator Pager [phone]  (8a-5p)

## 2024-07-03 NOTE — Care Management Important Message (Signed)
 Important Message  Patient Details  Name: Eric Cummings MRN: [id number] Date of Birth: 21-May-1966   Important Message Given:  Yes - Medicare IM     Claretta Deed 07/03/2024, 4:08 PM

## 2024-07-05 ENCOUNTER — Ambulatory Visit: Admitting: Podiatry

## 2024-08-14 ENCOUNTER — Ambulatory Visit: Admitting: Allergy

## 2024-09-14 ENCOUNTER — Ambulatory Visit: Admitting: Neurology

## 2024-09-15 ENCOUNTER — Ambulatory Visit: Admitting: Internal Medicine
